# Patient Record
Sex: Male | Born: 1935 | Race: Asian | Hispanic: No | Marital: Married | Smoking: Never smoker
Health system: Southern US, Community
[De-identification: ages and names within clinical notes are randomized; demographics above are authoritative.]

## PROBLEM LIST (undated history)

## (undated) DIAGNOSIS — K219 Gastro-esophageal reflux disease without esophagitis: Secondary | ICD-10-CM

## (undated) DIAGNOSIS — Z923 Personal history of irradiation: Secondary | ICD-10-CM

## (undated) DIAGNOSIS — C159 Malignant neoplasm of esophagus, unspecified: Secondary | ICD-10-CM

## (undated) HISTORY — DX: Hypercalcemia: E83.52

---

## 2012-11-12 ENCOUNTER — Encounter (HOSPITAL_COMMUNITY): Payer: Self-pay | Admitting: Emergency Medicine

## 2012-11-12 DIAGNOSIS — K5900 Constipation, unspecified: Secondary | ICD-10-CM | POA: Diagnosis present

## 2012-11-12 DIAGNOSIS — R1310 Dysphagia, unspecified: Secondary | ICD-10-CM | POA: Diagnosis present

## 2012-11-12 DIAGNOSIS — R52 Pain, unspecified: Secondary | ICD-10-CM | POA: Diagnosis present

## 2012-11-12 DIAGNOSIS — K219 Gastro-esophageal reflux disease without esophagitis: Secondary | ICD-10-CM | POA: Diagnosis present

## 2012-11-12 DIAGNOSIS — E43 Unspecified severe protein-calorie malnutrition: Secondary | ICD-10-CM | POA: Diagnosis present

## 2012-11-12 DIAGNOSIS — K222 Esophageal obstruction: Secondary | ICD-10-CM | POA: Diagnosis present

## 2012-11-12 DIAGNOSIS — C159 Malignant neoplasm of esophagus, unspecified: Principal | ICD-10-CM | POA: Diagnosis present

## 2012-11-12 NOTE — ED Notes (Signed)
PT. REPORTS DIFFICULTY SWALLOWING SOLID FOOD AND SOMETIMES LIQUID FOR 3 MONTHS , PT. STATED IT FEELS LIKE IT GET STUCK IN HIS ESOPHAGUS , DAUGHTER TRANSLATING ( VIET[place]ESE ) FOR PT. RESPIRATIONS UNLABORED , SPEECH CLEAR , NO FACIAL SYMMETRY , EQUAL STRONG GRIPS / NO ARM DRIFT /AMBULATORY.

## 2012-11-13 ENCOUNTER — Encounter (HOSPITAL_COMMUNITY)
Admission: EM | Disposition: A | Discharge status: Home Health | Payer: Self-pay | Source: Home / Self Care | Attending: Internal Medicine

## 2012-11-13 ENCOUNTER — Inpatient Hospital Stay (HOSPITAL_COMMUNITY)
Admission: EM | Admit: 2012-11-13 | Discharge: 2012-11-22 | DRG: 374 | Disposition: A | Discharge status: Home Health | Payer: MEDICAID | Attending: Internal Medicine | Admitting: Internal Medicine

## 2012-11-13 ENCOUNTER — Inpatient Hospital Stay (HOSPITAL_COMMUNITY): Payer: Self-pay

## 2012-11-13 ENCOUNTER — Encounter (HOSPITAL_COMMUNITY): Payer: Self-pay

## 2012-11-13 DIAGNOSIS — E43 Unspecified severe protein-calorie malnutrition: Secondary | ICD-10-CM | POA: Insufficient documentation

## 2012-11-13 DIAGNOSIS — R109 Unspecified abdominal pain: Secondary | ICD-10-CM

## 2012-11-13 DIAGNOSIS — R1310 Dysphagia, unspecified: Secondary | ICD-10-CM

## 2012-11-13 DIAGNOSIS — C159 Malignant neoplasm of esophagus, unspecified: Secondary | ICD-10-CM

## 2012-11-13 HISTORY — PX: ESOPHAGOGASTRODUODENOSCOPY (EGD) WITH ESOPHAGEAL DILATION: SHX5812

## 2012-11-13 HISTORY — DX: Gastro-esophageal reflux disease without esophagitis: K21.9

## 2012-11-13 HISTORY — PX: SAVORY DILATION: SHX5439

## 2012-11-13 HISTORY — DX: Malignant neoplasm of esophagus, unspecified: C15.9

## 2012-11-13 LAB — BASIC METABOLIC PANEL
BUN: 7 mg/dL (ref 6–23)
Chloride: 106 mEq/L (ref 96–112)
GFR calc Af Amer: 89 mL/min — ABNORMAL LOW (ref >90)
Glucose, Bld: 117 mg/dL — ABNORMAL HIGH (ref 70–99)
Potassium: 4.3 mEq/L (ref 3.5–5.1)
Sodium: 138 mEq/L (ref 135–145)

## 2012-11-13 LAB — CBC
HCT: 34.3 % — ABNORMAL LOW (ref 39.0–52.0)
Hemoglobin: 11.3 g/dL — ABNORMAL LOW (ref 13.0–17.0)
Platelets: 209 10*3/uL (ref 150–400)
RDW: 14.1 % (ref 11.5–15.5)
RDW: 14.2 % (ref 11.5–15.5)
WBC: 7.9 10*3/uL (ref 4.0–10.5)
WBC: 6.3 10*3/uL (ref 4.0–10.5)

## 2012-11-13 LAB — POCT I-STAT, CHEM 8
Chloride: 101 mEq/L (ref 96–112)
HCT: 40 % (ref 39.0–52.0)
Hemoglobin: 13.6 g/dL (ref 13.0–17.0)
Potassium: 4.4 mEq/L (ref 3.5–5.1)

## 2012-11-13 SURGERY — EGD (ESOPHAGOGASTRODUODENOSCOPY)
Anesthesia: Moderate Sedation

## 2012-11-13 SURGERY — ESOPHAGOGASTRODUODENOSCOPY (EGD) WITH ESOPHAGEAL DILATION
Anesthesia: Moderate Sedation

## 2012-11-13 MED ORDER — BUTAMBEN-TETRACAINE-BENZOCAINE 2-2-14 % EX AERO
INHALATION_SPRAY | CUTANEOUS | Status: DC | PRN
  Administered 2012-11-13: 2 via TOPICAL

## 2012-11-13 MED ORDER — IOHEXOL 300 MG/ML  SOLN
100.0000 mL | Freq: Once | INTRAMUSCULAR | Status: AC | PRN
  Administered 2012-11-13: 100 mL via INTRAVENOUS

## 2012-11-13 MED ORDER — FENTANYL CITRATE 0.05 MG/ML IJ SOLN
INTRAMUSCULAR | Status: DC | PRN
  Administered 2012-11-13 (×3): 25 ug via INTRAVENOUS

## 2012-11-13 MED ORDER — MIDAZOLAM HCL 10 MG/2ML IJ SOLN
INTRAMUSCULAR | Status: DC | PRN
  Administered 2012-11-13 (×2): 2 mg via INTRAVENOUS
  Administered 2012-11-13: 1 mg via INTRAVENOUS
  Administered 2012-11-13: 2 mg via INTRAVENOUS

## 2012-11-13 MED ORDER — ONDANSETRON HCL 4 MG/2ML IJ SOLN
4.0000 mg | Freq: Four times a day (QID) | INTRAMUSCULAR | Status: DC | PRN
  Administered 2012-11-14 (×2): 4 mg via INTRAVENOUS
  Filled 2012-11-13 (×2): qty 2

## 2012-11-13 MED ORDER — SODIUM CHLORIDE 0.9 % IV SOLN
INTRAVENOUS | Status: DC
  Administered 2012-11-13: 500 mL via INTRAVENOUS

## 2012-11-13 MED ORDER — KCL IN DEXTROSE-NACL 20-5-0.9 MEQ/L-%-% IV SOLN
INTRAVENOUS | Status: DC
  Administered 2012-11-13: 1000 mL via INTRAVENOUS
  Administered 2012-11-13 – 2012-11-21 (×18): via INTRAVENOUS
  Filled 2012-11-13 (×25): qty 1000

## 2012-11-13 MED ORDER — PANTOPRAZOLE SODIUM 40 MG IV SOLR
40.0000 mg | INTRAVENOUS | Status: DC
  Administered 2012-11-13 – 2012-11-21 (×9): 40 mg via INTRAVENOUS
  Filled 2012-11-13 (×13): qty 40

## 2012-11-13 MED ORDER — PANTOPRAZOLE SODIUM 40 MG IV SOLR
40.0000 mg | Freq: Once | INTRAVENOUS | Status: AC
  Administered 2012-11-13: 40 mg via INTRAVENOUS
  Filled 2012-11-13: qty 40

## 2012-11-13 MED ORDER — SODIUM CHLORIDE 0.9 % IV SOLN: INTRAVENOUS | Status: DC

## 2012-11-13 MED ORDER — ONDANSETRON HCL 4 MG PO TABS: 4.0000 mg | ORAL_TABLET | Freq: Four times a day (QID) | ORAL | Status: DC | PRN

## 2012-11-13 MED ORDER — SODIUM CHLORIDE 0.9 % IV SOLN
INTRAVENOUS | Status: DC
  Administered 2012-11-13: 02:00:00 via INTRAVENOUS

## 2012-11-13 MED ORDER — IOHEXOL 300 MG/ML  SOLN
25.0000 mL | INTRAMUSCULAR | Status: AC
  Administered 2012-11-13: 25 mL via ORAL

## 2012-11-13 NOTE — Progress Notes (Signed)
Patient back from endoscopy, arousable, VSS. Will continue to monitor.

## 2012-11-13 NOTE — H&P (View-Only) (Signed)
Gastroenterology Consult: 8:22 AM 11/13/2012   Referring Provider: Marlin Canary  Primary Care Physician:  None Primary Gastroenterologist:  none  Pt does not speak English.  Vietnamese is his native tongue  Reason for Consultation:  Dysphagia, odynophagia  HPI: Douglas Jordan is a 79 y.o. male.  Hx GERD. Taking unspecified OTC "ulcer" medication. 3 months of progressive dysphagia, initially with solids, now unable to drink liquids. 25# weight loss. + regurgitation.  Pain in lower chest, xyphoid with swallowing.  No bloody or black stools.  BMET and CBC unremarkable.  No imaging to date.  His biggest complaint is the pain, that is what prompted his coming to ED.   No NSAIDs, no ETOH for decades, no tobacco products in use.     Past Medical History  Diagnosis Date  . GERD (gastroesophageal reflux disease) No knownn hx but, pt reports symptoms of mid sternal pain at xiphoid that is irriated when eating.    History reviewed. No pertinent past surgical history.  Prior to Admission medications   Medication Sig Start Date End Date Taking? Authorizing Provider  OVER THE COUNTER MEDICATION Take 1 tablet by mouth once. OTC med for ulcer   Yes Historical Provider, MD  Probiotic Product (PROBIOTIC DAILY PO) Take 1 tablet by mouth once.   Yes Historical Provider, MD    Scheduled Meds: . pantoprazole (PROTONIX) IV  40 mg Intravenous Q24H   Infusions: . dextrose 5 % and 0.9 % NaCl with KCl 20 mEq/L 1,000 mL (11/13/12 0415)   PRN Meds: ondansetron (ZOFRAN) IV, ondansetron   Allergies as of 11/12/2012  . (No Known Allergies)    No family history on file.  History   Social History  . Marital Status: Married    Spouse Name: N/A    Number of Children: N/A  . Years of Education: N/A   Occupational History  . Not on file.   Social History Main Topics  . Smoking status: Unknown If Ever Smoked  . Smokeless tobacco: Not on file  . Alcohol  Use: No  . Drug Use: No  . Sexually Active: Not on file   Other Topics Concern  . Not on file   Social History Narrative  . No narrative on file    REVIEW OF SYSTEMS: Constitutional:  No weakness, no falls ENT:  No nose bleeds Pulm:  No cough or dyspnea CV:  No palpitations .  No pedal edema GU:  No dysuria, no frequency.  No weak stream GI:  Per HPI.  BMs normal. Heme:  No hx of anemia. No hx jaundice   Transfusions:  none Neuro:  No headache,  No blurry vision. Derm:  No itching or sores Endocrine:  No increased thirst, no sweats or chills Immunization:  Not queried.   Travel:  Not queeried   PHYSICAL EXAM: Vital signs in last 24 hours: Temp:  [97.7 F (36.5 C)-97.9 F (36.6 C)] 97.7 F (36.5 C) (07/11 0619) Pulse Rate:  [73-102] 94 (07/11 0619) Resp:  [18-20] 18 (07/11 0619) BP: (130-159)/(76-88) 146/77 mmHg (07/11 0619) SpO2:  [97 %-100 %] 98 % (07/11 0619) Weight:  [55.339 kg (122 lb)] 55.339 kg (122 lb) (07/11 0306)  General: lokks well, does not look 81.  Comfortable except for the epigatric/chest pain Head:  No asymmetry, no facial edema  Eyes:  No icterus or pallor Ears:  ? HOH  Nose:  No discharge or congestion Mouth:  Good dentition, most teeth still intact.  Moist and clear oral  MM Neck:  No JVD or bruits Lungs:  Clear bil.  No dyspnea or cough Heart: RRR.  No MRG Abdomen:  Soft, ND.  Tender in epigastrum but no guard/rebound.  No mass.  No  organomegaly.   Rectal: deferred   Musc/Skeltl: no joint deformity or swelling Extremities:  No CCE  Neurologic:  No tremor, oriented x 3.  Moves all 4 limbs. Skin:  No sores, no rash Tattoos:  none Nodes:  No cervical adenopathy   Psych:  Cooperative, pleasant.   Intake/Output from previous day:   Intake/Output this shift:    LAB RESULTS:  Recent Labs  11/13/12 0107 11/13/12 0123  WBC 7.9  --   HGB 12.5* 13.6  HCT 37.2* 40.0  PLT 209  --    BMET Lab Results  Component Value Date   NA 139  11/13/2012   K 4.4 11/13/2012   CL 101 11/13/2012   GLUCOSE 100* 11/13/2012   BUN 9 11/13/2012   CREATININE 1.10 11/13/2012   RADIOLOGY STUDIES: None  ENDOSCOPIC STUDIES: none  IMPRESSION: *  Progressive dysphagia and odynphagia with weight loss.  Rule out benign peptic stricture vs esophageal cancer.  Other possiblilities include achalasia or esophageal dysmotility.  *  Language barrier.   *  Lack of primary care provider.   PLAN: *  EGD with possible esophageal dilatation around 1 PM today.  Spoke with patient and translator and with daughter.  Pt understands that he is to undergo EGD with possible dilatation and biopsy.  Risks of esophageal perforation d/w daughter.  She gave verbal consent to the procedure.    LOS: 0 days   Jennye Moccasin  11/13/2012, 8:22 AM Pager: [phone]     ________________________________________________________________________  [facility] MD note:  I personally examined the patient, reviewed the data and agree with the assessment and plan described above.  HIgh suspicion for esopahgeal cancer. For EGD todfay.   Rob Bunting, MD [facility] Pager [phone]

## 2012-11-13 NOTE — Progress Notes (Signed)
Patient to endoscopy.

## 2012-11-13 NOTE — Consult Note (Signed)
[facility] Consult: 8:22 AM 11/13/2012   Referring Provider: Jessica Vann  Primary Care Physician:  None Primary Gastroenterologist:  none  Pt does not speak English.  Vietnamese is his native tongue  Reason for Consultation:  Dysphagia, odynophagia  HPI: Douglas Jordan is a 79 y.o. male.  Hx GERD. Taking unspecified OTC "ulcer" medication. 3 months of progressive dysphagia, initially with solids, now unable to drink liquids. 25# weight loss. + regurgitation.  Pain in lower chest, xyphoid with swallowing.  No bloody or black stools.  BMET and CBC unremarkable.  No imaging to date.  His biggest complaint is the pain, that is what prompted his coming to ED.   No NSAIDs, no ETOH for decades, no tobacco products in use.     Past Medical History  Diagnosis Date  . GERD (gastroesophageal reflux disease) No knownn hx but, pt reports symptoms of mid sternal pain at xiphoid that is irriated when eating.    History reviewed. No pertinent past surgical history.  Prior to Admission medications   Medication Sig Start Date End Date Taking? Authorizing Provider  OVER THE COUNTER MEDICATION Take 1 tablet by mouth once. OTC med for ulcer   Yes Historical Provider, MD  Probiotic Product (PROBIOTIC DAILY PO) Take 1 tablet by mouth once.   Yes Historical Provider, MD    Scheduled Meds: . pantoprazole (PROTONIX) IV  40 mg Intravenous Q24H   Infusions: . dextrose 5 % and 0.9 % NaCl with KCl 20 mEq/L 1,000 mL (11/13/12 0415)   PRN Meds: ondansetron (ZOFRAN) IV, ondansetron   Allergies as of 11/12/2012  . (No Known Allergies)    No family history on file.  History   Social History  . Marital Status: Married    Spouse Name: N/A    Number of Children: N/A  . Years of Education: N/A   Occupational History  . Not on file.   Social History Main Topics  . Smoking status: Unknown If Ever Smoked  . Smokeless tobacco: Not on file  . Alcohol  Use: No  . Drug Use: No  . Sexually Active: Not on file   Other Topics Concern  . Not on file   Social History Narrative  . No narrative on file    REVIEW OF SYSTEMS: Constitutional:  No weakness, no falls ENT:  No nose bleeds Pulm:  No cough or dyspnea CV:  No palpitations .  No pedal edema GU:  No dysuria, no frequency.  No weak stream GI:  Per HPI.  BMs normal. Heme:  No hx of anemia. No hx jaundice   Transfusions:  none Neuro:  No headache,  No blurry vision. Derm:  No itching or sores Endocrine:  No increased thirst, no sweats or chills Immunization:  Not queried.   Travel:  Not queeried   PHYSICAL EXAM: Vital signs in last 24 hours: Temp:  [97.7 F (36.5 C)-97.9 F (36.6 C)] 97.7 F (36.5 C) (07/11 0619) Pulse Rate:  [73-102] 94 (07/11 0619) Resp:  [18-20] 18 (07/11 0619) BP: (130-159)/(76-88) 146/77 mmHg (07/11 0619) SpO2:  [97 %-100 %] 98 % (07/11 0619) Weight:  [55.339 kg (122 lb)] 55.339 kg (122 lb) (07/11 0306)  General: lokks well, does not look 81.  Comfortable except for the epigatric/chest pain Head:  No asymmetry, no facial edema  Eyes:  No icterus or pallor Ears:  ? HOH  Nose:  No discharge or congestion Mouth:  Good dentition, most teeth still intact.  Moist and clear oral   MM Neck:  No JVD or bruits Lungs:  Clear bil.  No dyspnea or cough Heart: RRR.  No MRG Abdomen:  Soft, ND.  Tender in epigastrum but no guard/rebound.  No mass.  No  organomegaly.   Rectal: deferred   Musc/Skeltl: no joint deformity or swelling Extremities:  No CCE  Neurologic:  No tremor, oriented x 3.  Moves all 4 limbs. Skin:  No sores, no rash Tattoos:  none Nodes:  No cervical adenopathy   Psych:  Cooperative, pleasant.   Intake/Output from previous day:   Intake/Output this shift:    LAB RESULTS:  Recent Labs  11/13/12 0107 11/13/12 0123  WBC 7.9  --   HGB 12.5* 13.6  HCT 37.2* 40.0  PLT 209  --    BMET Lab Results  Component Value Date   NA 139  11/13/2012   K 4.4 11/13/2012   CL 101 11/13/2012   GLUCOSE 100* 11/13/2012   BUN 9 11/13/2012   CREATININE 1.10 11/13/2012   RADIOLOGY STUDIES: None  ENDOSCOPIC STUDIES: none  IMPRESSION: *  Progressive dysphagia and odynphagia with weight loss.  Rule out benign peptic stricture vs esophageal cancer.  Other possiblilities include achalasia or esophageal dysmotility.  *  Language barrier.   *  Lack of primary care provider.   PLAN: *  EGD with possible esophageal dilatation around 1 PM today.  Spoke with patient and translator and with daughter.  Pt understands that he is to undergo EGD with possible dilatation and biopsy.  Risks of esophageal perforation d/w daughter.  She gave verbal consent to the procedure.    LOS: 0 days   Sarah Gribbin  11/13/2012, 8:22 AM Pager: [phone]     ________________________________________________________________________  [facility] MD note:  I personally examined the patient, reviewed the data and agree with the assessment and plan described above.  HIgh suspicion for esopahgeal cancer. For EGD todfay.   Daniel Jacobs, MD [facility] Pager [phone]  

## 2012-11-13 NOTE — Progress Notes (Signed)
Patient admitted early this AM by Dr. Conley Rolls- please see H&P.  For EGD today.  Appreciate GI- medically stable  Marlin Canary DO

## 2012-11-13 NOTE — H&P (Signed)
[facility] History and Physical  Douglas Jordan ZOX:[id number] DOB: Dec 06, 1929 DOA: 11/13/2012  Referring physician: ED PCP: No PCP Per Patient   Chief Complaint: Dysphagia  HPI: Douglas Jordan is a 79 y.o. male who presents to the ED with c/o dysphagia.  He describes his symptoms through his family member who is translating for the patient as food "getting stuck in his throat".  This has been getting progressively worse over the last few months, started with solids, and has now progressed to even thin liquids.  No emesis nor nausea, it does hurt when he tries to swallow solids as food passes down his throat.  The EDP called Dr. Leone Payor due to the progressive dysphagia, Dr. Leone Payor wants the patient admitted to medicine and plans to take patient for EGD in AM.  Review of Systems: 12 systems reviewed and otherwise negative.  Past Medical History  Diagnosis Date  . GERD (gastroesophageal reflux disease) No knownn hx but, pt reports symptoms of mid sternal pain at xiphoid that is irriated when eating.   History reviewed. No pertinent past surgical history. Social History:  reports that he does not drink alcohol or use illicit drugs. His tobacco history is not on file.   No Known Allergies  No family history on file.   Prior to Admission medications   Medication Sig Start Date End Date Taking? Authorizing Provider  OVER THE COUNTER MEDICATION Take 1 tablet by mouth once. OTC med for ulcer   Yes Historical Provider, MD  Probiotic Product (PROBIOTIC DAILY PO) Take 1 tablet by mouth once.   Yes Historical Provider, MD   Physical Exam: Filed Vitals:   11/13/12 0200 11/13/12 0215 11/13/12 0230 11/13/12 0245  BP: 149/76 152/84 144/80 130/80  Pulse: 77 73 79 81  Temp:      TempSrc:      Resp:      SpO2: 100% 100% 100% 100%    General:  NAD, resting comfortably in bed Eyes: PEERLA EOMI ENT: mucous membranes moist Neck: supple w/o JVD Cardiovascular: RRR w/o MRG Respiratory: CTA  B Abdomen: soft, nt, nd, bs+ Skin: no rash nor lesion Musculoskeletal: MAE, full ROM all 4 extremities Psychiatric: normal tone and affect Neurologic: AAOx3, grossly non-focal  Labs on Admission:  Basic Metabolic Panel:  Recent Labs Lab 11/13/12 0123  NA 139  K 4.4  CL 101  GLUCOSE 100*  BUN 9  CREATININE 1.10   Liver Function Tests: No results found for this basename: AST, ALT, ALKPHOS, BILITOT, PROT, ALBUMIN,  in the last 168 hours No results found for this basename: LIPASE, AMYLASE,  in the last 168 hours No results found for this basename: AMMONIA,  in the last 168 hours CBC:  Recent Labs Lab 11/13/12 0107 11/13/12 0123  WBC 7.9  --   HGB 12.5* 13.6  HCT 37.2* 40.0  MCV 83.0  --   PLT 209  --    Cardiac Enzymes: No results found for this basename: CKTOTAL, CKMB, CKMBINDEX, TROPONINI,  in the last 168 hours  BNP (last 3 results) No results found for this basename: PROBNP,  in the last 8760 hours CBG: No results found for this basename: GLUCAP,  in the last 168 hours  Radiological Exams on Admission: No results found.  EKG: Independently reviewed.  Assessment/Plan Active Problems:   Dysphagia   1. Dysphagia - the slow progressive dysphagia to liquids and now solids is somewhat concerning for malignancy or other mass obstruction vs dysmotility disorder, especially with his PMH  of GERD.  Agree with Dr. Leone Payor, will admit patient to our service, keep patient NPO and plan on EGD in AM as discussed with family at bedside.  Dr. Leone Payor planning to take patient for EGD in AM.  Code Status: Full Code (must indicate code status--if unknown or must be presumed, indicate so) Family Communication: Spoke with daughter at bedside who translated for patient (indicate person spoken with, if applicable, with phone number if by telephone) Disposition Plan: Admit to obs (indicate anticipated LOS)  Time spent: 50 min  Rayland Hamed M. [facility] Pager  [phone]  If 7PM-7AM, please contact night-coverage [email or web address] Password [facility] 11/13/2012, 3:55 AM

## 2012-11-13 NOTE — Progress Notes (Signed)
INITIAL NUTRITION ASSESSMENT  DOCUMENTATION CODES Per approved criteria  -Severe malnutrition in the context of acute illness or injury   INTERVENTION:  Advance diet as medically appropriate RD to follow for nutrition care plan, add interventions accordingly  NUTRITION DIAGNOSIS: Inadequate oral intake related to inability to eat as evidenced by NPO status  Goal: Oral intake with meals & supplements to meet >/= 90% of estimated nutrition needs  Monitor:  PO diet advancement & intake, weight, labs, I/O's  Reason for Assessment: Malnutrition Screening Tool Report  79 y.o. male  Admitting Dx: dysphagia and odynophagia, progressive  ASSESSMENT: Patient with PMH of GERD presented with dysphagia and odynophagia; has been feeling solid food get stuck about 3 months ago; evaluation in ER included a normal Hb of 13.6 ---> GI was consulted and hospitalist was asked to accept patient to [facility] for admission.  RD utilized telephonic interpreter service to obtain nutrition hx ([place] patient reports a decreased appetite and difficulty with swallowing solids & liquids for the past 3 months; also confirms a 25 lb weight loss in this time (17%) ---> severe for time frame; he is amenable to nutrition supplements when/as able.  Patient meets criteria for severe malnutrition in the context of acute illness or injury given < 75% intake of estimated energy requirement for > 1 month and 17% weight loss x 3 months.  Height: Ht Readings from Last 1 Encounters:  11/13/12 5\' 4"  (1.626 m)    Weight: Wt Readings from Last 1 Encounters:  11/13/12 122 lb (55.339 kg)    Ideal Body Weight: 130 lb  % Ideal Body Weight: 94%  Wt Readings from Last 10 Encounters:  11/13/12 122 lb (55.339 kg)  11/13/12 122 lb (55.339 kg)  11/13/12 122 lb (55.339 kg)    Usual Body Weight: ~ 147 lb  % Usual Body Weight: 83%  BMI:  Body mass index is 20.93 kg/(m^2).  Estimated Nutritional Needs: Kcal:  1800-2000 Protein: 90-100 gm Fluid: 1.8-2.0 L  Skin: Intact  Diet Order: NPO  EDUCATION NEEDS: -No education needs identified at this time  Labs:   Recent Labs Lab 11/13/12 0123  NA 139  K 4.4  CL 101  BUN 9  CREATININE 1.10  GLUCOSE 100*    Scheduled Meds: . pantoprazole (PROTONIX) IV  40 mg Intravenous Q24H    Continuous Infusions: . sodium chloride    . sodium chloride    . dextrose 5 % and 0.9 % NaCl with KCl 20 mEq/L 1,000 mL (11/13/12 0415)    Past Medical History  Diagnosis Date  . GERD (gastroesophageal reflux disease) No knownn hx but, pt reports symptoms of mid sternal pain at xiphoid that is irriated when eating.    History reviewed. No pertinent past surgical history.  Maureen Chatters, RD, LDN Pager #: [phone] After-Hours Pager #: [phone]

## 2012-11-13 NOTE — ED Notes (Signed)
Denies N/V/D. States he simply feels like when he eats the food can not go down and it causes him great pain.

## 2012-11-13 NOTE — ED Provider Notes (Signed)
   History    CSN: [id number] Arrival date & time 11/12/12  2123  First MD Initiated Contact with Patient 11/13/12 0053     Chief Complaint  Patient presents with  . Dysphagia   (Consider location/radiation/quality/duration/timing/severity/associated sxs/prior Treatment) HPI Hx per daughter. She interprets vietnamese.  Over the last few months worsening dysphagia. Started with solids and now also with liquids, had trouble eating soup today, no emesis. No CP or SOB. It does hurt when he tries to swallow solids, as food passes. He has had previous reflux symptoms, no PCP. No F/C. No h/o similar symptoms otherwise, are mod in severity.   History reviewed. No pertinent past medical history. History reviewed. No pertinent past surgical history. No family history on file. History  Substance Use Topics  . Smoking status: Never Smoker   . Smokeless tobacco: Not on file  . Alcohol Use: No    Review of Systems  Constitutional: Negative for fever and chills.  HENT: Negative for neck pain and neck stiffness.   Eyes: Negative for pain.  Respiratory: Negative for shortness of breath.   Cardiovascular: Negative for chest pain.  Gastrointestinal: Negative for vomiting and abdominal pain.  Genitourinary: Negative for dysuria.  Musculoskeletal: Negative for back pain.  Skin: Negative for rash.  Neurological: Negative for headaches.  All other systems reviewed and are negative.    Allergies  Review of patient's allergies indicates no known allergies.  Home Medications   Current Outpatient Rx  Name  Route  Sig  Dispense  Refill  . OVER THE COUNTER MEDICATION   Oral   Take 1 tablet by mouth once. OTC med for ulcer         . Probiotic Product (PROBIOTIC DAILY PO)   Oral   Take 1 tablet by mouth once.          BP 155/88  Pulse 89  Temp(Src) 97.9 F (36.6 C) (Oral)  Resp 20  SpO2 97% Physical Exam  Constitutional: He is oriented to person, place, and time. He appears  well-developed and well-nourished.  HENT:  Head: Normocephalic and atraumatic.  Eyes: EOM are normal. Pupils are equal, round, and reactive to light.  Neck: Neck supple.  Cardiovascular: Normal rate, regular rhythm and intact distal pulses.   Pulmonary/Chest: Effort normal and breath sounds normal. No respiratory distress. He exhibits no tenderness.  Abdominal: Soft. Bowel sounds are normal. He exhibits no distension. There is no tenderness.  Musculoskeletal: Normal range of motion. He exhibits no edema.  Neurological: He is alert and oriented to person, place, and time. No cranial nerve deficit.  Skin: Skin is warm and dry.    ED Course  Procedures (including critical care time)  Gags on water in the ED. Made NPO  IVFs. IV protonix  1:13 AM d/w GI Dr Leone Payor, plan admit OBS and GI will see in am and likely scope  1:24 AM DR Julian Reil to admit  MDM  Dysphagia progressive/ now trouble with fluids  Labs ordered  IVfs  MED admit  Sunnie Nielsen, MD 11/13/12 0131

## 2012-11-13 NOTE — H&P (Signed)
[facility] History and Physical  Winson Eichorn ZOX:[id number] DOB: 09/16/1929    PCP:   None.  Chief Complaint: dysphagia and odynophagia, progressive.  HPI: Douglas Jordan is an 79 y.o. male with benign PMH except for GERD, untreated, presents with dysphagia and odynophagia.  He said he has originally feeling solid food stuck about 3 months ago, and this has progressed to him unable to drink even liquid.  He has gone from solid to soup, and has lost about 25 lbs over the last 3 months.  Before, he was having GERD, with some food regurgitation.  He had pain about the xyphoid process when he eats.  There has been no black of bloody stool, and he has normal BM today.  Evalaution in the ER included a normal Hb of 13.6.  Other serology was unremarkable as well.  Dr Leone Payor of GI was consulted and hospitalist was asked to accept patient to [facility] for admission.  Rewiew of Systems:  Constitutional: Negative for malaise, fever and chills. No significant weight gain Eyes: Negative for eye pain, redness and discharge, diplopia, visual changes, or flashes of light. ENMT: Negative for ear pain, hoarseness, nasal congestion, sinus pressure and sore throat. No headaches; tinnitus, drooling, or problem swallowing. Cardiovascular: Negative for chest pain, palpitations, diaphoresis, dyspnea and peripheral edema. ; No orthopnea, PND Respiratory: Negative for cough, hemoptysis, wheezing and stridor. No pleuritic chestpain. Gastrointestinal: Negative for nausea, vomiting, diarrhea, constipation, melena, blood in stool, hematemesis, jaundice and rectal bleeding.    Genitourinary: Negative for frequency, dysuria, incontinence,flank pain and hematuria; Musculoskeletal: Negative for back pain and neck pain. Negative for swelling and trauma.;  Skin: . Negative for pruritus, rash, abrasions, bruising and skin lesion.; ulcerations Neuro: Negative for headache, lightheadedness and neck stiffness. Negative for weakness, altered  level of consciousness , altered mental status, extremity weakness, burning feet, involuntary movement, seizure and syncope.  Psych: negative for anxiety, depression, insomnia, tearfulness, panic attacks, hallucinations, paranoia, suicidal or homicidal ideation    Past Medical History  Diagnosis Date  . GERD (gastroesophageal reflux disease) No knownn hx but, pt reports symptoms of mid sternal pain at xiphoid that is irriated when eating.    History reviewed. No pertinent past surgical history.  Medications:  HOME MEDS: Prior to Admission medications   Medication Sig Start Date End Date Taking? Authorizing Provider  OVER THE COUNTER MEDICATION Take 1 tablet by mouth once. OTC med for ulcer   Yes Historical Provider, MD  Probiotic Product (PROBIOTIC DAILY PO) Take 1 tablet by mouth once.   Yes Historical Provider, MD     Allergies:  No Known Allergies  Social History:   reports that he does not drink alcohol or use illicit drugs. His tobacco history is not on file.  Family History: No family history on file.   Physical Exam: Filed Vitals:   11/13/12 0200 11/13/12 0215 11/13/12 0230 11/13/12 0245  BP: 149/76 152/84 144/80 130/80  Pulse: 77 73 79 81  Temp:      TempSrc:      Resp:      SpO2: 100% 100% 100% 100%   Blood pressure 130/80, pulse 81, temperature 97.9 F (36.6 C), temperature source Oral, resp. rate 20, SpO2 100.00%.  GEN:  Pleasant  patient lying in the stretcher in no acute distress; cooperative with exam. PSYCH:  alert and oriented x4; does not appear anxious or depressed; affect is appropriate. HEENT: Mucous membranes pink and anicteric; PERRLA; EOM intact; no cervical lymphadenopathy nor thyromegaly or carotid  bruit; no JVD; There were no stridor. Neck is very supple. Breasts:: Not examined CHEST WALL: No tenderness CHEST: Normal respiration, clear to auscultation bilaterally.  HEART: Regular rate and rhythm.  There are no murmur, rub, or gallops.    BACK: No kyphosis or scoliosis; no CVA tenderness ABDOMEN: soft and non-tender; no masses, no organomegaly, normal abdominal bowel sounds; no pannus; no intertriginous candida. There is no rebound and no distention. Rectal Exam: Not done EXTREMITIES: No bone or joint deformity; age-appropriate arthropathy of the hands and knees; no edema; no ulcerations.  There is no calf tenderness. Genitalia: not examined PULSES: 2+ and symmetric SKIN: Normal hydration no rash or ulceration CNS: Cranial nerves 2-12 grossly intact no focal lateralizing neurologic deficit.  Speech is fluent; uvula elevated with phonation, facial symmetry and tongue midline. DTR are normal bilaterally, cerebella exam is intact, barbinski is negative and strengths are equaled bilaterally.  No sensory loss.   Labs on Admission:  Basic Metabolic Panel:  Recent Labs Lab 11/13/12 0123  NA 139  K 4.4  CL 101  GLUCOSE 100*  BUN 9  CREATININE 1.10   Liver Function Tests: No results found for this basename: AST, ALT, ALKPHOS, BILITOT, PROT, ALBUMIN,  in the last 168 hours No results found for this basename: LIPASE, AMYLASE,  in the last 168 hours No results found for this basename: AMMONIA,  in the last 168 hours CBC:  Recent Labs Lab 11/13/12 0107 11/13/12 0123  WBC 7.9  --   HGB 12.5* 13.6  HCT 37.2* 40.0  MCV 83.0  --   PLT 209  --    Cardiac Enzymes: No results found for this basename: CKTOTAL, CKMB, CKMBINDEX, TROPONINI,  in the last 168 hours  CBG: No results found for this basename: GLUCAP,  in the last 168 hours   Radiological Exams on Admission: No results found.  Assessment/Plan  Progressive dysphagia. GERD.  PLAN:  I am hoping that he has esosphageal stricture from GERD, but concerned very much about malignancy.  Will make NPO and give IVF.  Start PPI.  He will likely need EGD as soon as possible.  He is stable, full code, and will be admitted to [facility] service.  Thank you for letting participate  in his care.  Other plans as per orders.  Code Status: FULL Unk Lightning, MD. [facility] Pager [phone] 7pm to 7am.  11/13/2012, 4:02 AM

## 2012-11-13 NOTE — Op Note (Addendum)
[facility] [address] [place], [address]   ENDOSCOPY PROCEDURE REPORT  PATIENT: Douglas Jordan, Douglas Jordan  MR#: [id number] BIRTHDATE: 04-Sep-1929 , 83  yrs. old GENDER: Male ENDOSCOPIST: Rachael Fee, MD PROCEDURE DATE:  11/13/2012 PROCEDURE:  EGD w/ biopsy ASA CLASS:     Class III INDICATIONS:  dysphagia, weight loss. MEDICATIONS: Fentanyl 75 mcg IV and Versed 8 mg IV TOPICAL ANESTHETIC: none  DESCRIPTION OF PROCEDURE: After the risks benefits and alternatives of the procedure were thoroughly explained, informed consent was obtained.  The Pentax Gastroscope [id number] endoscope was introduced through the mouth and advanced to the esophagus lower. Without limitations.  The instrument was slowly withdrawn as the mucosa was fully examined.     There was a malignant appearing mass with proximal edge located 27cm from incisors (proximal to mid esophagus).  This extended distally and became circumferential and obstructing.  I could not advance the gastroscope into his stomach.  Multiple biopsies were taken. Retroflexion was not performed.     The scope was then withdrawn from the patient and the procedure completed.  COMPLICATIONS: There were no complications. ENDOSCOPIC IMPRESSION: There was a malignant appearing mass with proximal edge located 27cm from incisors (proximal to mid esophagus).  This extended distally and became circumferential and obstructing.  I could not advance the gastroscope into his stomach.  Multiple biopsies were taken.  RECOMMENDATIONS: He needs staging CT scan (chest, abdomen, pelvis).  He will need nutrition addressed, PEG tube cannot be placed endoscopically because the tumor is obstructing access to his stomach.  IR should be consulted following staging workup.  Should consider inpatient oncology consultation as well.   eSigned:  Rachael Fee, MD 11/13/2012 1:31 PM         Addendum: I spoke with his daughter Selena Batten on  the phone about the findings, recommenedations.

## 2012-11-13 NOTE — Interval H&P Note (Signed)
History and Physical Interval Note:  11/13/2012 1:07 PM  Douglas Jordan  has presented today for surgery, with the diagnosis of difficulty swallowing  The various methods of treatment have been discussed with the patient and family. After consideration of risks, benefits and other options for treatment, the patient has consented to  Procedure(s): ESOPHAGOGASTRODUODENOSCOPY (EGD) WITH ESOPHAGEAL DILATION (N/A) SAVORY DILATION (N/A) as a surgical intervention .  The patient's history has been reviewed, patient examined, no change in status, stable for surgery.  I have reviewed the patient's chart and labs.  Questions were answered to the patient's satisfaction.     Rob Bunting

## 2012-11-14 DIAGNOSIS — E43 Unspecified severe protein-calorie malnutrition: Secondary | ICD-10-CM | POA: Insufficient documentation

## 2012-11-14 LAB — APTT: aPTT: 40 seconds — ABNORMAL HIGH (ref 24–37)

## 2012-11-14 MED ORDER — MORPHINE SULFATE 2 MG/ML IJ SOLN
1.0000 mg | INTRAMUSCULAR | Status: DC | PRN
  Administered 2012-11-14 – 2012-11-16 (×11): 1 mg via INTRAVENOUS
  Filled 2012-11-14 (×11): qty 1

## 2012-11-14 NOTE — Progress Notes (Signed)
[facility] Progress Note    Since last GI note: EGD yesterday, large obstructing malignant appearing esoiphagus mass. Biopsies taken, results probably back on Monday or Tuesday.  Staging CT scan suggests regional malignant adenopathy as well as distant metastasis (liver).  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  He was sleeping in room and I did not awaken him this AM.  I spoke with his daughter Selena Batten after endoscopy yesterday  Objective: Vital signs in last 24 hours: Temp:  [97.7 F (36.5 C)-98.3 F (36.8 C)] 98.3 F (36.8 C) (07/12 0628) Pulse Rate:  [69-84] 69 (07/12 0628) Resp:  [13-27] 16 (07/12 0628) BP: (110-156)/(69-89) 130/78 mmHg (07/12 0628) SpO2:  [94 %-100 %] 99 % (07/12 0628) Weight:  [128 lb 4.8 oz (58.196 kg)] 128 lb 4.8 oz (58.196 kg) (07/11 1452)   General: sleeping    Lab Results:  Recent Labs  11/13/12 0107 11/13/12 0123 11/13/12 1709  WBC 7.9  --  6.3  HGB 12.5* 13.6 11.3*  PLT 209  --  212  MCV 83.0  --  83.5    Recent Labs  11/13/12 0123 11/13/12 1709  NA 139 138  K 4.4 4.3  CL 101 106  CO2  --  25  GLUCOSE 100* 117*  BUN 9 7  CREATININE 1.10 0.92  CALCIUM  --  9.5     Medications: Scheduled Meds: . pantoprazole (PROTONIX) IV  40 mg Intravenous Q24H   Continuous Infusions: . dextrose 5 % and 0.9 % NaCl with KCl 20 mEq/L 125 mL/hr at 11/14/12 0114   PRN Meds:.ondansetron (ZOFRAN) IV, ondansetron    Assessment/Plan: 79 y.o. male with esophageal obstruction from presumed metastatic esophageal adenocarcinoma  Biopsies pending.  Should be back Monday or Tuesday.  I think he should have g-tube placed while in hosp to allow him to get nutrition.  The mass is obstructing his distal esophagus and I could not advance scope into stomach so this would have to be done by IR. With the complete obstruction, stenting is also not an option.   Given his social situation, should also consider making contact with oncology  while he is still here in hosp to expedite, facilitate out patient followup.    Please call or page with any further questions or concerns.     Rob Bunting, MD  11/14/2012, 8:49 AM [facility] Pager [phone]

## 2012-11-14 NOTE — Progress Notes (Signed)
IR aware of Gastrostomy tube placement request. Chart reviewed of this 79yo male with distal esophageal obstruction from large mass. Endoscopy report and images reviewed. CT of chest and abdomen reviewed. Evidence of metastatic spread noted  Pull-through gastrostomy is not possible due to esophageal obstruction. The pt is able to pass liquids given the evidence of the po contrast on his CT scan. However, IR would need to be able to pass a 5Fr catheter past obstruction to be able to insuflate stomach to allow safe direct stick.   Discussed with Dr. Fredia Sorrow IR will wait for biopsies and Oncology eval for possible planned treatment plan before proceeding with attempt at G-tube. Will follow along and reassess Monday.  Brayton El PA-C Interventional Radiology 11/14/2012 11:03 AM

## 2012-11-14 NOTE — Progress Notes (Addendum)
[facility] PROGRESS NOTE  Douglas Jordan AVW:[id number] DOB: 08-28-1929 DOA: 11/13/2012 PCP: No PCP Per Patient  Assessment/Plan: esophageal obstruction from presumed metastatic esophageal adenocarcinoma  Biopsies pending from EGD. Should be back Monday or Tuesday. IR for G tube placement.  The mass is obstructing his distal esophagus . Will consult oncology on Monday  CT scan of lung/abd/pelvis: 1. Bulky annular soft tissue mass involving the distal thoracic  esophagus, consistent with known primary esophageal carcinoma.  2. Metastatic lymphadenopathy in the posterior mediastinum and  right paratracheal region. Mild right hilar lymphadenopathy,  possibly also secondary to metastatic disease.  3. 6 mm noncalcified right lower lobe pulmonary nodule. Pulmonary  metastasis cannot be excluded. Consider PET CT scan for further  Evaluation  1. Metastatic upper abdominal lymphadenopathy in the gastrohepatic  ligament.  2. 1.5 cm hypovascular lesion in the posterior segment of the  right hepatic lobe, suspicious for liver metastasis. Consider PET  CT scan or abdomen MRI without and with contrast for further  evaluation.  Protein calorie malnutrition -G tube and then nutrition  Code Status: full Family Communication: called KIM and LM- spoke with patient in English as that is listed as his preferred language Disposition Plan: here until biopsy results are back   Consultants:  GI  IR  Oncology- will call on Monday  Procedures:  G tube per IR  EGD  Antibiotics:    HPI/Subjective: No new c/o  Objective: Filed Vitals:   11/13/12 1400 11/13/12 1452 11/13/12 2210 11/14/12 0628  BP: 110/69 120/72 141/80 130/78  Pulse:  71 73 69  Temp:  97.9 F (36.6 C) 98.1 F (36.7 C) 98.3 F (36.8 C)  TempSrc:  Oral Oral Oral  Resp: 27 16 16 16   Height:  5\' 1"  (1.549 m)    Weight:  58.196 kg (128 lb 4.8 oz)    SpO2: 97% 99% 99% 99%    Intake/Output Summary (Last 24 hours) at  11/14/12 1031 Last data filed at 11/13/12 1130  Gross per 24 hour  Intake 906.25 ml  Output      0 ml  Net 906.25 ml   Filed Weights   11/13/12 0306 11/13/12 1452  Weight: 55.339 kg (122 lb) 58.196 kg (128 lb 4.8 oz)    Exam:   General:  A+Ox3, NAD  Cardiovascular: rrr  Respiratory: clear anterior  Abdomen: +BS, soft  Musculoskeletal: moves all 4 ext  Data Reviewed: Basic Metabolic Panel:  Recent Labs Lab 11/13/12 0123 11/13/12 1709  NA 139 138  K 4.4 4.3  CL 101 106  CO2  --  25  GLUCOSE 100* 117*  BUN 9 7  CREATININE 1.10 0.92  CALCIUM  --  9.5   Liver Function Tests: No results found for this basename: AST, ALT, ALKPHOS, BILITOT, PROT, ALBUMIN,  in the last 168 hours No results found for this basename: LIPASE, AMYLASE,  in the last 168 hours No results found for this basename: AMMONIA,  in the last 168 hours CBC:  Recent Labs Lab 11/13/12 0107 11/13/12 0123 11/13/12 1709  WBC 7.9  --  6.3  HGB 12.5* 13.6 11.3*  HCT 37.2* 40.0 34.3*  MCV 83.0  --  83.5  PLT 209  --  212   Cardiac Enzymes: No results found for this basename: CKTOTAL, CKMB, CKMBINDEX, TROPONINI,  in the last 168 hours BNP (last 3 results) No results found for this basename: PROBNP,  in the last 8760 hours CBG: No results found for this basename: GLUCAP,  in the last 168 hours  No results found for this or any previous visit (from the past 240 hour(s)).   Studies: Ct Chest W Contrast  11/13/2012   *RADIOLOGY REPORT*  Clinical Data:  Esophageal carcinoma.  CT CHEST, ABDOMEN AND PELVIS WITH CONTRAST  Technique:  Multidetector CT imaging of the chest, abdomen and pelvis was performed following the standard protocol during bolus administration of intravenous contrast.  Contrast: OMNIPAQUE IOHEXOL 300 MG/ML  SOLN  Comparison:   None.  CT CHEST  Findings:  A bulky annular soft tissue mass is seen involving the distal thoracic esophagus which measures approximately 3.2 x 4.0 x 5.9  cm.  Para esophageal lymphadenopathy is seen within the posterior mediastinum both anterior and posterior to the descending thoracic aorta.  Largest lymph node measures 1.5 cm on image 33. Mediastinal lymphadenopathy also seen in the right paratracheal region measuring 2.0 x 2.8 cm on image 10. A 1.3 cm right hilar lymph node is seen with a small central focus of calcification. This could represent metastatic disease or partially calcified granulomatous disease.  No evidence of pleural or pericardial effusion. A 6 mm noncalcified pulmonary nodule is seen in the right lower lobe on image 29, which raises suspicion for pulmonary metastasis.  No other suspicious pulmonary nodules or masses are identified.  IMPRESSION:  1.  Bulky annular soft tissue mass involving the distal thoracic esophagus, consistent with known primary esophageal carcinoma. 2.  Metastatic lymphadenopathy in the posterior mediastinum and right paratracheal region.  Mild right hilar lymphadenopathy, possibly also secondary to metastatic disease. 3.  6 mm noncalcified right lower lobe pulmonary nodule.  Pulmonary metastasis cannot be excluded.  Consider PET CT scan for further evaluation  CT ABDOMEN AND PELVIS  Findings:  Upper abdominal lymphadenopathy is seen gastrohepatic ligament measuring 2.9 x 3.8 cm on image 47.  No other pathologically enlarged nodes are seen within the abdomen or pelvis.  A hypovascular lesion is seen in the posterior segment right hepatic lobe measuring 1.5 cm on image 37.  This is suspicious for a liver metastasis.  No other liver lesions are identified.  The gallbladder, pancreas, spleen, adrenal glands, kidneys are normal in appearance.  No evidence of hydronephrosis.  No pelvic masses or lymphadenopathy identified.  No evidence of inflammatory process or abnormal fluid collections.  No suspicious bone lesions identified.  IMPRESSION:  1.  Metastatic upper abdominal lymphadenopathy in the gastrohepatic ligament. 2.  1.5  cm hypovascular lesion in the posterior segment of the right hepatic lobe, suspicious for liver metastasis.  Consider PET CT scan or abdomen MRI without and with contrast for further evaluation.   Original Report Authenticated By: Myles Rosenthal, M.D.   Ct Abdomen Pelvis W Contrast  11/13/2012   *RADIOLOGY REPORT*  Clinical Data:  Esophageal carcinoma.  CT CHEST, ABDOMEN AND PELVIS WITH CONTRAST  Technique:  Multidetector CT imaging of the chest, abdomen and pelvis was performed following the standard protocol during bolus administration of intravenous contrast.  Contrast: OMNIPAQUE IOHEXOL 300 MG/ML  SOLN  Comparison:   None.  CT CHEST  Findings:  A bulky annular soft tissue mass is seen involving the distal thoracic esophagus which measures approximately 3.2 x 4.0 x 5.9 cm.  Para esophageal lymphadenopathy is seen within the posterior mediastinum both anterior and posterior to the descending thoracic aorta.  Largest lymph node measures 1.5 cm on image 33. Mediastinal lymphadenopathy also seen in the right paratracheal region measuring 2.0 x 2.8 cm on image 10.  A 1.3 cm right hilar lymph node is seen with a small central focus of calcification. This could represent metastatic disease or partially calcified granulomatous disease.  No evidence of pleural or pericardial effusion. A 6 mm noncalcified pulmonary nodule is seen in the right lower lobe on image 29, which raises suspicion for pulmonary metastasis.  No other suspicious pulmonary nodules or masses are identified.  IMPRESSION:  1.  Bulky annular soft tissue mass involving the distal thoracic esophagus, consistent with known primary esophageal carcinoma. 2.  Metastatic lymphadenopathy in the posterior mediastinum and right paratracheal region.  Mild right hilar lymphadenopathy, possibly also secondary to metastatic disease. 3.  6 mm noncalcified right lower lobe pulmonary nodule.  Pulmonary metastasis cannot be excluded.  Consider PET CT scan for further  evaluation  CT ABDOMEN AND PELVIS  Findings:  Upper abdominal lymphadenopathy is seen gastrohepatic ligament measuring 2.9 x 3.8 cm on image 47.  No other pathologically enlarged nodes are seen within the abdomen or pelvis.  A hypovascular lesion is seen in the posterior segment right hepatic lobe measuring 1.5 cm on image 37.  This is suspicious for a liver metastasis.  No other liver lesions are identified.  The gallbladder, pancreas, spleen, adrenal glands, kidneys are normal in appearance.  No evidence of hydronephrosis.  No pelvic masses or lymphadenopathy identified.  No evidence of inflammatory process or abnormal fluid collections.  No suspicious bone lesions identified.  IMPRESSION:  1.  Metastatic upper abdominal lymphadenopathy in the gastrohepatic ligament. 2.  1.5 cm hypovascular lesion in the posterior segment of the right hepatic lobe, suspicious for liver metastasis.  Consider PET CT scan or abdomen MRI without and with contrast for further evaluation.   Original Report Authenticated By: Myles Rosenthal, M.D.    Scheduled Meds: . pantoprazole (PROTONIX) IV  40 mg Intravenous Q24H   Continuous Infusions: . dextrose 5 % and 0.9 % NaCl with KCl 20 mEq/L 125 mL/hr at 11/14/12 0941    Active Problems:   Dysphagia   Esophageal cancer   Protein-calorie malnutrition, severe    Time spent: 35    [facility], Barnie Sopko  [facility] Pager [phone]. If 7PM-7AM, please contact night-coverage at [email or web address], password [facility] 11/14/2012, 10:31 AM  LOS: 1 day

## 2012-11-14 NOTE — Progress Notes (Signed)
Agree.  As above, may be difficult to insufflate stomach for direct stick gastrostomy due to the nearly obstructing esophageal mass.  Will readdress after path result final and plans made for treatment of the esophageal lesion.

## 2012-11-15 NOTE — Progress Notes (Addendum)
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 Should be back Monday or Tuesday. IR for G tube placement.  The mass is obstructing his distal esophagus . Will consult oncology- once pathology back  CT scan of lung/abd/pelvis: 1. Bulky annular soft tissue mass involving the distal thoracic  esophagus, consistent with known primary esophageal carcinoma.  2. Metastatic lymphadenopathy in the posterior mediastinum and  right paratracheal region. Mild right hilar lymphadenopathy,  possibly also secondary to metastatic disease.  3. 6 mm noncalcified right lower lobe pulmonary nodule. Pulmonary  metastasis cannot be excluded. Consider PET CT scan for further  Evaluation  1. Metastatic upper abdominal lymphadenopathy in the gastrohepatic  ligament.  2. 1.5 cm hypovascular lesion in the posterior segment of the  right hepatic lobe, suspicious for liver metastasis. Consider PET  CT scan or abdomen MRI without and with contrast for further  evaluation.  Protein calorie malnutrition -G tube and then feeds    Code Status: full Family Communication: spoke with patient Disposition Plan: here until biopsy results are back   Consultants:  GI  IR  Oncology- call Monday  Procedures:  G tube per IR  EGD  Antibiotics:    HPI/Subjective: C/o epigastric pain  Objective: Filed Vitals:   11/14/12 0628 11/14/12 1445 11/14/12 2300 11/15/12 0600  BP: 130/78 143/72 135/74 127/70  Pulse: 69 72 72 66  Temp: 98.3 F (36.8 C) 97.9 F (36.6 C) 98.2 F (36.8 C) 98.1 F (36.7 C)  TempSrc: Oral Oral Oral Oral  Resp: 16 18 18 18   Height:      Weight:      SpO2: 99% 100% 100% 100%    Intake/Output Summary (Last 24 hours) at 11/15/12 1439 Last data filed at 11/14/12 1500  Gross per 24 hour  Intake  3437.5 ml  Output      0 ml  Net 3437.5 ml   Filed Weights   11/13/12 0306 11/13/12 1452  Weight: 55.339 kg (122 lb) 58.196 kg (128 lb 4.8 oz)    Exam:   General:  A+Ox3, NAD  Cardiovascular: rrr  Respiratory: clear anterior  Abdomen: +BS, soft  Musculoskeletal: moves all 4 ext  Data Reviewed: Basic Metabolic Panel:  Recent Labs Lab 11/13/12 0123 11/13/12 1709  NA 139 138  K 4.4 4.3  CL 101 106  CO2  --  25  GLUCOSE 100* 117*  BUN 9 7  CREATININE 1.10 0.92  CALCIUM  --  9.5   Liver Function Tests: No results found for this basename: AST, ALT, ALKPHOS, BILITOT, PROT, ALBUMIN,  in the last 168 hours No results found for this basename: LIPASE, AMYLASE,  in the last 168 hours No results found for this basename: AMMONIA,  in the last 168 hours CBC:  Recent Labs Lab 11/13/12 0107 11/13/12 0123 11/13/12 1709  WBC 7.9  --  6.3  HGB 12.5* 13.6 11.3*  HCT 37.2* 40.0 34.3*  MCV 83.0  --  83.5  PLT 209  --  212   Cardiac Enzymes: No results found for this basename: CKTOTAL, CKMB, CKMBINDEX, TROPONINI,  in the last 168 hours BNP (last 3 results) No results found for this basename: PROBNP,  in the last 8760 hours CBG: No results found for this basename: GLUCAP,  in the last 168 hours  No results found for this or any previous visit (from the  past 240 hour(s)).   Studies: Ct Chest W Contrast  11/13/2012   *RADIOLOGY REPORT*  Clinical Data:  Esophageal carcinoma.  CT CHEST, ABDOMEN AND PELVIS WITH CONTRAST  Technique:  Multidetector CT imaging of the chest, abdomen and pelvis was performed following the standard protocol during bolus administration of intravenous contrast.  Contrast: OMNIPAQUE IOHEXOL 300 MG/ML  SOLN  Comparison:   None.  CT CHEST  Findings:  A bulky annular soft tissue mass is seen involving the distal thoracic esophagus which measures approximately 3.2 x 4.0 x 5.9 cm.  Para esophageal lymphadenopathy is seen within the posterior mediastinum  both anterior and posterior to the descending thoracic aorta.  Largest lymph node measures 1.5 cm on image 33. Mediastinal lymphadenopathy also seen in the right paratracheal region measuring 2.0 x 2.8 cm on image 10. A 1.3 cm right hilar lymph node is seen with a small central focus of calcification. This could represent metastatic disease or partially calcified granulomatous disease.  No evidence of pleural or pericardial effusion. A 6 mm noncalcified pulmonary nodule is seen in the right lower lobe on image 29, which raises suspicion for pulmonary metastasis.  No other suspicious pulmonary nodules or masses are identified.  IMPRESSION:  1.  Bulky annular soft tissue mass involving the distal thoracic esophagus, consistent with known primary esophageal carcinoma. 2.  Metastatic lymphadenopathy in the posterior mediastinum and right paratracheal region.  Mild right hilar lymphadenopathy, possibly also secondary to metastatic disease. 3.  6 mm noncalcified right lower lobe pulmonary nodule.  Pulmonary metastasis cannot be excluded.  Consider PET CT scan for further evaluation  CT ABDOMEN AND PELVIS  Findings:  Upper abdominal lymphadenopathy is seen gastrohepatic ligament measuring 2.9 x 3.8 cm on image 47.  No other pathologically enlarged nodes are seen within the abdomen or pelvis.  A hypovascular lesion is seen in the posterior segment right hepatic lobe measuring 1.5 cm on image 37.  This is suspicious for a liver metastasis.  No other liver lesions are identified.  The gallbladder, pancreas, spleen, adrenal glands, kidneys are normal in appearance.  No evidence of hydronephrosis.  No pelvic masses or lymphadenopathy identified.  No evidence of inflammatory process or abnormal fluid collections.  No suspicious bone lesions identified.  IMPRESSION:  1.  Metastatic upper abdominal lymphadenopathy in the gastrohepatic ligament. 2.  1.5 cm hypovascular lesion in the posterior segment of the right hepatic lobe,  suspicious for liver metastasis.  Consider PET CT scan or abdomen MRI without and with contrast for further evaluation.   Original Report Authenticated By: Myles Rosenthal, M.D.   Ct Abdomen Pelvis W Contrast  11/13/2012   *RADIOLOGY REPORT*  Clinical Data:  Esophageal carcinoma.  CT CHEST, ABDOMEN AND PELVIS WITH CONTRAST  Technique:  Multidetector CT imaging of the chest, abdomen and pelvis was performed following the standard protocol during bolus administration of intravenous contrast.  Contrast: OMNIPAQUE IOHEXOL 300 MG/ML  SOLN  Comparison:   None.  CT CHEST  Findings:  A bulky annular soft tissue mass is seen involving the distal thoracic esophagus which measures approximately 3.2 x 4.0 x 5.9 cm.  Para esophageal lymphadenopathy is seen within the posterior mediastinum both anterior and posterior to the descending thoracic aorta.  Largest lymph node measures 1.5 cm on image 33. Mediastinal lymphadenopathy also seen in the right paratracheal region measuring 2.0 x 2.8 cm on image 10. A 1.3 cm right hilar lymph node is seen with a small central focus of calcification. This  could represent metastatic disease or partially calcified granulomatous disease.  No evidence of pleural or pericardial effusion. A 6 mm noncalcified pulmonary nodule is seen in the right lower lobe on image 29, which raises suspicion for pulmonary metastasis.  No other suspicious pulmonary nodules or masses are identified.  IMPRESSION:  1.  Bulky annular soft tissue mass involving the distal thoracic esophagus, consistent with known primary esophageal carcinoma. 2.  Metastatic lymphadenopathy in the posterior mediastinum and right paratracheal region.  Mild right hilar lymphadenopathy, possibly also secondary to metastatic disease. 3.  6 mm noncalcified right lower lobe pulmonary nodule.  Pulmonary metastasis cannot be excluded.  Consider PET CT scan for further evaluation  CT ABDOMEN AND PELVIS  Findings:  Upper abdominal  lymphadenopathy is seen gastrohepatic ligament measuring 2.9 x 3.8 cm on image 47.  No other pathologically enlarged nodes are seen within the abdomen or pelvis.  A hypovascular lesion is seen in the posterior segment right hepatic lobe measuring 1.5 cm on image 37.  This is suspicious for a liver metastasis.  No other liver lesions are identified.  The gallbladder, pancreas, spleen, adrenal glands, kidneys are normal in appearance.  No evidence of hydronephrosis.  No pelvic masses or lymphadenopathy identified.  No evidence of inflammatory process or abnormal fluid collections.  No suspicious bone lesions identified.  IMPRESSION:  1.  Metastatic upper abdominal lymphadenopathy in the gastrohepatic ligament. 2.  1.5 cm hypovascular lesion in the posterior segment of the right hepatic lobe, suspicious for liver metastasis.  Consider PET CT scan or abdomen MRI without and with contrast for further evaluation.   Original Report Authenticated By: Myles Rosenthal, M.D.    Scheduled Meds: . pantoprazole (PROTONIX) IV  40 mg Intravenous Q24H   Continuous Infusions: . dextrose 5 % and 0.9 % NaCl with KCl 20 mEq/L 125 mL/hr at 11/15/12 1042    Active Problems:   Dysphagia   Esophageal cancer   Protein-calorie malnutrition, severe    Time spent: 35    [facility], Gadiel John  [facility] Pager [phone]. If 7PM-7AM, please contact night-coverage at [email or web address], password [facility] 11/15/2012, 2:39 PM  LOS: 2 days

## 2012-11-16 ENCOUNTER — Encounter (HOSPITAL_COMMUNITY): Payer: Self-pay | Admitting: Gastroenterology

## 2012-11-16 MED ORDER — MORPHINE SULFATE 2 MG/ML IJ SOLN
1.0000 mg | INTRAMUSCULAR | Status: DC | PRN
  Administered 2012-11-16 – 2012-11-18 (×13): 1 mg via INTRAVENOUS
  Filled 2012-11-16 (×12): qty 1

## 2012-11-16 MED ORDER — CEFAZOLIN SODIUM-DEXTROSE 2-3 GM-% IV SOLR
2.0000 g | Freq: Once | INTRAVENOUS | Status: AC
  Administered 2012-11-17: 2 g via INTRAVENOUS
  Filled 2012-11-16: qty 50

## 2012-11-16 NOTE — Consult Note (Signed)
[facility]  [phone]   HEMATOLOGY ONCOLOGY CONSULTATION   Douglas Jordan  DOB: 15-Nov-1929  MR#: [id number]  CSN#: [id number]    Requesting Physician: [facility]  Primary MD: None  History of present illness:Esphageal Cancer        79 y.o. [place]   male, non English speaking, asked to see for evaluation of Esophageal Cancer.   In review, he presented with 3 month history of progressive dysphagia, initially with solids progressing to liquids, with subsequent 25 lb weight loss. Symptoms were accompanied by significant regurgitation. Workup included EGD by GI service on 11/13/2012, revealing a malignant appearing esophageal mass, which biopsies returned today, 7/14 positive for Squamous Cell Carcinoma.  CT of the chest ion 7/11 reveals Bulky annular soft tissue mass involving the distal thoracic esophagus, consistent with known primary esophageal carcinoma.  Metastatic lymphadenopathy in the posterior mediastinum and right paratracheal region. Mild right hilar lymphadenopathy, possibly also secondary to metastatic disease. and a 6 mm noncalcified right lower lobe pulmonary nodule. Also seen, Metastatic upper abdominal lymphadenopathy in the gastrohepatic  ligament. and a 1.5 cm hypovascular lesion in the posterior segment of the right hepatic lobe, suspicious for liver metastasis He is to have a  G-tube placement by IR today for nutritional support.     We were kindly requested to see the patient with recommendations.    Past medical history:      Past Medical History  Diagnosis Date  . GERD (gastroesophageal reflux disease) No knownn hx but, pt reports symptoms of mid sternal pain at xiphoid that is irriated when eating.  . Esophageal cancer 11/13/2012    Past surgical history:      Past Surgical History  Procedure Laterality Date  . Esophagogastroduodenoscopy (egd) with esophageal dilation N/A 11/13/2012    Procedure: ESOPHAGOGASTRODUODENOSCOPY  (EGD) WITH ESOPHAGEAL DILATION;  Surgeon: Rachael Fee, MD;  Location: [facility] ENDOSCOPY;  Service: Endoscopy;  Laterality: N/A;  . Savory dilation N/A 11/13/2012    Procedure: SAVORY DILATION;  Surgeon: Rachael Fee, MD;  Location: [facility] ENDOSCOPY;  Service: Endoscopy;  Laterality: N/A;    Medications:  Prior to Admission:  Prescriptions prior to admission  Medication Sig Dispense Refill  . OVER THE COUNTER MEDICATION Take 1 tablet by mouth once. OTC med for ulcer      . Probiotic Product (PROBIOTIC DAILY PO) Take 1 tablet by mouth once.        NWG:NFAOZHYQ injection, ondansetron (ZOFRAN) IV, ondansetron  Allergies: No Known Allergies  Family history: No apparent history of cancer in the family.                                         Social history:   Married. One daughter, Selena Batten, main caretaker. Lives in [place]. Full Code.   Review of systems:  See HPI for significant positives. Rest of the ROS negative. There is language barrier, but patient able to understand  very simple questions if accompanied by facial gestures/commands.     Physical exam:      Filed Vitals:   11/16/12 0543  BP: 128/78  Pulse: 66  Temp: 98.2 F (36.8 C)  Resp: 18     Body mass index is 24.25 kg/(m^2).   General: 79 y.o. male  in no acute distress A. and O. x3  well-developed and thin .Patient looks much younger than stated age. Language  barrier.  HEENT: Normocephalic, atraumatic, PERRLA. Oral cavity without thrush or lesions. Neck supple. no thyromegaly, no cervical or supraclavicular adenopathy  Lungs clear bilaterally . No wheezing, rhonchi or rales. No axillary masses. Breasts: not examined. Cardiac regular rate and rhythm normal S1-S2, no murmur , rubs or gallops Abdomen soft nontender , bowel sounds x4. No HSM. No masses palpable.  GU/rectal: deferred. Extremities no clubbing, no  cyanosis or edema. No bruising or petechial rash Musculoskeletal: no spinal tenderness.  Neuro: Non  Focal   Lab results:       CBC  Recent Labs Lab 11/13/12 0107 11/13/12 0123 11/13/12 1709  WBC 7.9  --  6.3  HGB 12.5* 13.6 11.3*  HCT 37.2* 40.0 34.3*  PLT 209  --  212  MCV 83.0  --  83.5  MCH 27.9  --  27.5  MCHC 33.6  --  32.9  RDW 14.1  --  14.2   Chemistries   Recent Labs Lab 11/13/12 0123 11/13/12 1709  NA 139 138  K 4.4 4.3  CL 101 106  CO2  --  25  GLUCOSE 100* 117*  BUN 9 7  CREATININE 1.10 0.92  CALCIUM  --  9.5     Coagulation profile  Recent Labs Lab 11/14/12 1100  INR 1.12   Studies:      Ct Chest W Contrast  11/13/2012   *RADIOLOGY REPORT*  Clinical Data:  Esophageal carcinoma.  CT CHEST, ABDOMEN AND PELVIS WITH CONTRAST  Technique:  Multidetector CT imaging of the chest, abdomen and pelvis was performed following the standard protocol during bolus administration of intravenous contrast.  Contrast: OMNIPAQUE IOHEXOL 300 MG/ML  SOLN  Comparison:   None.  CT CHEST  Findings:  A bulky annular soft tissue mass is seen involving the distal thoracic esophagus which measures approximately 3.2 x 4.0 x 5.9 cm.  Para esophageal lymphadenopathy is seen within the posterior mediastinum both anterior and posterior to the descending thoracic aorta.  Largest lymph node measures 1.5 cm on image 33. Mediastinal lymphadenopathy also seen in the right paratracheal region measuring 2.0 x 2.8 cm on image 10. A 1.3 cm right hilar lymph node is seen with a small central focus of calcification. This could represent metastatic disease or partially calcified granulomatous disease.  No evidence of pleural or pericardial effusion. A 6 mm noncalcified pulmonary nodule is seen in the right lower lobe on image 29, which raises suspicion for pulmonary metastasis.  No other suspicious pulmonary nodules or masses are identified.  IMPRESSION:  1.  Bulky annular soft tissue mass involving the distal thoracic esophagus, consistent with known primary esophageal carcinoma. 2.  Metastatic  lymphadenopathy in the posterior mediastinum and right paratracheal region.  Mild right hilar lymphadenopathy, possibly also secondary to metastatic disease. 3.  6 mm noncalcified right lower lobe pulmonary nodule.  Pulmonary metastasis cannot be excluded.  Consider PET CT scan for further evaluation    Ct Abdomen Pelvis W Contrast  7/11/2014CT ABDOMEN AND PELVIS  Findings:  Upper abdominal lymphadenopathy is seen gastrohepatic ligament measuring 2.9 x 3.8 cm on image 47.  No other pathologically enlarged nodes are seen within the abdomen or pelvis.  A hypovascular lesion is seen in the posterior segment right hepatic lobe measuring 1.5 cm on image 37.  This is suspicious for a liver metastasis.  No other liver lesions are identified.  The gallbladder, pancreas, spleen, adrenal glands, kidneys are normal in appearance.  No evidence of hydronephrosis.  No pelvic  masses or lymphadenopathy identified.  No evidence of inflammatory process or abnormal fluid collections.  No suspicious bone lesions identified.  IMPRESSION:  1.  Metastatic upper abdominal lymphadenopathy in the gastrohepatic ligament. 2.  1.5 cm hypovascular lesion in the posterior segment of the right hepatic lobe, suspicious for liver metastasis.  Consider PET CT scan or abdomen MRI without and with contrast for further evaluation.   Original Report Authenticated By: Myles Rosenthal, M.D.    Assessmnent/Plan:79 y.o. male asked to see for new diagnosis of Squamous Cell Carcinoma biopsy proven , after being found to have an esophageal mass, as well as lymphadenopathy and possible liver mets.   Dr. Karel Jarvis is to see the patient following this consult with recommendations regarding diagnosis, treatment options and further workup studies which may include a PET scan as outpatient. .  An addendum to this note is to be written. Thank you for the referral.   Douglas Eke, PA-C 11/16/2012   Patient was seen and examined.  Locally advanced SCC of the  esophagus; however, CT scan showing single liver lesion. Will need further evaluation of the liver to confirm if the patient has metastatic diease vs locally advanced, may consider obtaining MRI of the liver or PET scan.  -Need nutritional support and J-tube placement. - Bronch to r/o fistula - IV access (port placement). -F/u in the clinic once he is D/ced for final plan but If locally advanced, will plan for concurrent chemo-rad first then evaluation for surgical resection. If metastatic diease, he will be receiving systemic chemotherapy.   Zachery Dakins, MD @T @ 4:48 PM

## 2012-11-16 NOTE — Progress Notes (Signed)
Patient ID: Douglas Jordan, male   DOB: Oct 25, 1929, 79 y.o.   MRN: [id number]   Await path reports from endo  For perc G tube soon Pt is on IR radar  Will prepare for G tube after review of path Direct stick in CT vs IR per Dr Archer Asa

## 2012-11-16 NOTE — Progress Notes (Addendum)
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 Should be back Monday or Tuesday. IR for G tube placement.  The mass is obstructing his distal esophagus . Path back as squamous cell- oncology called- Dr. Karel Jarvis  CT scan of lung/abd/pelvis: 1. Bulky annular soft tissue mass involving the distal thoracic  esophagus, consistent with known primary esophageal carcinoma.  2. Metastatic lymphadenopathy in the posterior mediastinum and  right paratracheal region. Mild right hilar lymphadenopathy,  possibly also secondary to metastatic disease.  3. 6 mm noncalcified right lower lobe pulmonary nodule. Pulmonary  metastasis cannot be excluded. Consider PET CT scan for further  Evaluation  1. Metastatic upper abdominal lymphadenopathy in the gastrohepatic  ligament.  2. 1.5 cm hypovascular lesion in the posterior segment of the  right hepatic lobe, suspicious for liver metastasis. Consider PET  CT scan or abdomen MRI without and with contrast for further  evaluation.  Protein calorie malnutrition -G tube and then feeds - IVF    Code Status: full Family Communication: spoke with patient Disposition Plan: here until biopsy results are back   Consultants:  GI  IR  Oncology- call Monday  Procedures:  G tube per IR  EGD  Antibiotics:    HPI/Subjective: C/o epigastric pain   Objective: Filed Vitals:   11/15/12 0600 11/15/12 1520 11/15/12 2124 11/16/12 0543  BP: 127/70 135/76 138/71 128/78  Pulse: 66 69 67 66  Temp: 98.1 F (36.7 C) 98.2 F (36.8 C) 98.7 F (37.1 C) 98.2 F (36.8 C)  TempSrc: Oral Oral Oral Oral  Resp: 18 16 18 18   Height:      Weight:      SpO2: 100% 100% 100% 100%    Intake/Output Summary (Last 24 hours) at 11/16/12 0927 Last data filed at 11/16/12 0500  Gross  per 24 hour  Intake   4750 ml  Output      0 ml  Net   4750 ml   Filed Weights   11/13/12 0306 11/13/12 1452  Weight: 55.339 kg (122 lb) 58.196 kg (128 lb 4.8 oz)    Exam:   General:  A+Ox3, NAD  Cardiovascular: rrr  Respiratory: clear anterior  Abdomen: +BS, soft  Musculoskeletal: moves all 4 ext  Data Reviewed: Basic Metabolic Panel:  Recent Labs Lab 11/13/12 0123 11/13/12 1709  NA 139 138  K 4.4 4.3  CL 101 106  CO2  --  25  GLUCOSE 100* 117*  BUN 9 7  CREATININE 1.10 0.92  CALCIUM  --  9.5   Liver Function Tests: No results found for this basename: AST, ALT, ALKPHOS, BILITOT, PROT, ALBUMIN,  in the last 168 hours  Recent Labs Lab 11/15/12 1547  LIPASE 16   No results found for this basename: AMMONIA,  in the last 168 hours CBC:  Recent Labs Lab 11/13/12 0107 11/13/12 0123 11/13/12 1709  WBC 7.9  --  6.3  HGB 12.5* 13.6 11.3*  HCT 37.2* 40.0 34.3*  MCV 83.0  --  83.5  PLT 209  --  212   Cardiac Enzymes: No results found for this basename: CKTOTAL, CKMB, CKMBINDEX, TROPONINI,  in the last 168 hours BNP (last 3 results) No results found for this basename: PROBNP,  in the last 8760 hours CBG: No results found for this basename: GLUCAP,  in the last 168 hours  No results found for  this or any previous visit (from the past 240 hour(s)).   Studies: No results found.  Scheduled Meds: . pantoprazole (PROTONIX) IV  40 mg Intravenous Q24H   Continuous Infusions: . dextrose 5 % and 0.9 % NaCl with KCl 20 mEq/L 125 mL/hr at 11/15/12 1909    Active Problems:   Dysphagia   Esophageal cancer   Protein-calorie malnutrition, severe    Time spent: 35    [facility], [place]  [facility] Pager [phone]. If 7PM-7AM, please contact night-coverage at [email or web address], password [facility] 11/16/2012, 9:27 AM  LOS: 3 days

## 2012-11-16 NOTE — H&P (Signed)
Douglas Jordan is an 79 y.o. male.   Chief Complaint: esophageal ca 3 month hx progressive dysphagia; wt loss Regurgitation Endo reveals bulky distal espoph mass: + squamous cell carcinoma Scheduled for percutaneous gastric tube placement Will be direct stick procedure; no barium  Discussed with Dr Archer Asa; Rad to determine if able to insufflate stomachfor Fluoro guidance vs CT guidance  HPI: GERD; Esoph Ca  Past Medical History  Diagnosis Date  . GERD (gastroesophageal reflux disease) No knownn hx but, pt reports symptoms of mid sternal pain at xiphoid that is irriated when eating.  . Esophageal cancer 11/13/2012    Past Surgical History  Procedure Laterality Date  . Esophagogastroduodenoscopy (egd) with esophageal dilation N/A 11/13/2012    Procedure: ESOPHAGOGASTRODUODENOSCOPY (EGD) WITH ESOPHAGEAL DILATION;  Surgeon: Rachael Fee, MD;  Location: [facility] ENDOSCOPY;  Service: Endoscopy;  Laterality: N/A;  . Savory dilation N/A 11/13/2012    Procedure: SAVORY DILATION;  Surgeon: Rachael Fee, MD;  Location: [facility] ENDOSCOPY;  Service: Endoscopy;  Laterality: N/A;    No family history on file. Social History:  reports that he does not drink alcohol or use illicit drugs. His tobacco history is not on file.  Allergies: No Known Allergies  Medications Prior to Admission  Medication Sig Dispense Refill  . OVER THE COUNTER MEDICATION Take 1 tablet by mouth once. OTC med for ulcer      . Probiotic Product (PROBIOTIC DAILY PO) Take 1 tablet by mouth once.        Results for orders placed during the hospital encounter of 11/13/12 (from the past 48 hour(s))  LIPASE, BLOOD     Status: None   Collection Time    11/15/12  3:47 PM      Result Value Range   Lipase 16  11 - 59 U/L   No results found.  Review of Systems  Constitutional: Positive for weight loss. Negative for fever.  Respiratory: Negative for shortness of breath.   Cardiovascular: Negative for chest pain.  Gastrointestinal:  Positive for nausea, vomiting and abdominal pain.  Musculoskeletal: Positive for back pain.  Neurological: Positive for weakness. Negative for dizziness and headaches.    Blood pressure 134/80, pulse 79, temperature 98.2 F (36.8 C), temperature source Oral, resp. rate 18, height 5\' 1"  (1.549 m), weight 128 lb 4.8 oz (58.196 kg), SpO2 100.00%. Physical Exam  Constitutional: He is oriented to person, place, and time.  frail  Cardiovascular: Normal rate, regular rhythm and normal heart sounds.   No murmur heard. Respiratory: Effort normal and breath sounds normal. He has no wheezes.  GI: Soft. Bowel sounds are normal. There is tenderness.  Musculoskeletal: Normal range of motion.  Neurological: He is alert and oriented to person, place, and time.  Non English speaking  Skin: Skin is warm and dry.  Psychiatric:  Consent signed and in chart with dtr via phone     Assessment/Plan Wt loss; progressive dysphagia x 3 mo; regurgitation Endo reveals mass: +Sq cell ca For perc G tube in am Direct stick Discussed with dtr via phone Aware of procedure benefits and risks ans agreeable to proceed Consent signed and in chart Ancef on call Labs wnl  Todd Argabright A 11/16/2012, 3:17 PM

## 2012-11-16 NOTE — H&P (Signed)
Agree with PA note.  If able to get a Kumpe catheter past the obstructing esophageal mass, can do  a push-through G-tube in IR.  If unable to get past the mass, may require direct percutaneous access under CT guidance.   Signed,  Sterling Big, MD Vascular & Interventional Radiologist [facility] Radiology

## 2012-11-17 ENCOUNTER — Inpatient Hospital Stay (HOSPITAL_COMMUNITY): Payer: MEDICAID

## 2012-11-17 ENCOUNTER — Encounter (HOSPITAL_COMMUNITY): Payer: Self-pay | Admitting: *Deleted

## 2012-11-17 LAB — BASIC METABOLIC PANEL
BUN: 4 mg/dL — ABNORMAL LOW (ref 6–23)
CO2: 25 mEq/L (ref 19–32)
Chloride: 104 mEq/L (ref 96–112)
Creatinine, Ser: 0.96 mg/dL (ref 0.50–1.35)
GFR calc Af Amer: 88 mL/min — ABNORMAL LOW (ref >90)
Glucose, Bld: 101 mg/dL — ABNORMAL HIGH (ref 70–99)
Potassium: 4.6 mEq/L (ref 3.5–5.1)

## 2012-11-17 MED ORDER — FENTANYL CITRATE 0.05 MG/ML IJ SOLN
INTRAMUSCULAR | Status: AC | PRN
  Administered 2012-11-17 (×2): 25 ug via INTRAVENOUS
  Administered 2012-11-17: 50 ug via INTRAVENOUS

## 2012-11-17 MED ORDER — IOHEXOL 300 MG/ML  SOLN
50.0000 mL | Freq: Once | INTRAMUSCULAR | Status: AC | PRN
  Administered 2012-11-17: 25 mL

## 2012-11-17 MED ORDER — MIDAZOLAM HCL 2 MG/2ML IJ SOLN
INTRAMUSCULAR | Status: AC
  Filled 2012-11-17: qty 4

## 2012-11-17 MED ORDER — GADOBENATE DIMEGLUMINE 529 MG/ML IV SOLN
15.0000 mL | Freq: Once | INTRAVENOUS | Status: AC | PRN
  Administered 2012-11-17: 12 mL via INTRAVENOUS

## 2012-11-17 MED ORDER — GLUCAGON HCL (RDNA) 1 MG IJ SOLR
INTRAMUSCULAR | Status: AC
  Filled 2012-11-17: qty 1

## 2012-11-17 MED ORDER — BACITRACIN-NEOMYCIN-POLYMYXIN 400-5-5000 EX OINT
TOPICAL_OINTMENT | CUTANEOUS | Status: AC
  Administered 2012-11-17: 1
  Filled 2012-11-17: qty 1

## 2012-11-17 MED ORDER — FENTANYL CITRATE 0.05 MG/ML IJ SOLN
INTRAMUSCULAR | Status: AC
  Filled 2012-11-17: qty 4

## 2012-11-17 MED ORDER — GLUCAGON HCL (RDNA) 1 MG IJ SOLR
INTRAMUSCULAR | Status: AC | PRN
  Administered 2012-11-17: 1 mg via INTRAVENOUS

## 2012-11-17 MED ORDER — MIDAZOLAM HCL 2 MG/2ML IJ SOLN
INTRAMUSCULAR | Status: AC | PRN
  Administered 2012-11-17 (×3): 1 mg via INTRAVENOUS

## 2012-11-17 NOTE — Procedures (Signed)
Successful fluoroscopic guided insertion of gastrostomy tube without immediate post procedural complicatoin.   The gastrostomy tube may be used immediately for medications.  Otherwise, place gastrostomy tube to low wall suction for 24 hrs.  Tube feeds may be initiated in 24 hours as per the primary team.   

## 2012-11-17 NOTE — Progress Notes (Signed)
NUTRITION FOLLOW UP  DOCUMENTATION CODES Per approved criteria  -Severe malnutrition in the context of acute illness or injury   Intervention:    Once G-tube ready to be used, recommend initiation of Osmolite 1.2 formula at 20 ml/hr and increase by 10 ml every 12 hours to goal rate of 60 ml/hr with Prostat liquid protein 30 ml twice daily to provide 1928 total kcals, 110 gm protein, 1181 ml of free water  Monitor Mg, Phos, K+ levels RD to follow for nutrition care plan  Nutrition Dx:   Inadequate oral intake related to inability to eat as evidenced by NPO status, ongoing  New Goal:   EN support to meet > 90% of estimated nutrition needs, currently unmet  Monitor:   EN initiation & tolerance, weight, labs, I/O's  Assessment:   Patient with PMH of GERD presented with dysphagia and odynophagia; has been feeling solid food get stuck about 3 months ago; evaluation in ER included a normal Hb of 13.6 ---> GI was consulted and hospitalist was asked to accept patient to [facility] for admission.  EGD with biopsy completed 7/11.  Impression was malignant esophageal mass obstructing acces to patient's stomach.  For G-tube placement today in IR.  Oncology following.  Patient is at risk for refeeding syndrome with EN initiation & advancement given severe malnutrition.  Height: Ht Readings from Last 1 Encounters:  11/13/12 5\' 1"  (1.549 m)    Weight Status:   Wt Readings from Last 1 Encounters:  11/13/12 128 lb 4.8 oz (58.196 kg)    Re-estimated needs:  Kcal: 1800-2000 Protein: 105-115 gm Fluid: 1.8-2.0 L  Skin: Intact  Diet Order: NPO   Intake/Output Summary (Last 24 hours) at 11/17/12 1125 Last data filed at 11/17/12 0600  Gross per 24 hour  Intake   2352 ml  Output      0 ml  Net   2352 ml    Labs:   Recent Labs Lab 11/13/12 0123 11/13/12 1709  NA 139 138  K 4.4 4.3  CL 101 106  CO2  --  25  BUN 9 7  CREATININE 1.10 0.92  CALCIUM  --  9.5  GLUCOSE 100* 117*     Scheduled Meds: .  ceFAZolin (ANCEF) IV  2 g Intravenous Once  . pantoprazole (PROTONIX) IV  40 mg Intravenous Q24H    Continuous Infusions: . dextrose 5 % and 0.9 % NaCl with KCl 20 mEq/L 125 mL/hr at 11/17/12 0307    Maureen Chatters, RD, LDN Pager #: [phone] After-Hours Pager #: [phone]

## 2012-11-17 NOTE — Progress Notes (Signed)
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 Should be back Monday or Tuesday. IR for G tube placement.  The mass is obstructing his distal esophagus . Path back as squamous cell- oncology called- Dr. Karel Jarvis  MRI of liver ordered and if + for metastasis, will get biopsy (check BMP this AM since MRI with contrast)  CT scan of lung/abd/pelvis: 1. Bulky annular soft tissue mass involving the distal thoracic  esophagus, consistent with known primary esophageal carcinoma.  2. Metastatic lymphadenopathy in the posterior mediastinum and  right paratracheal region. Mild right hilar lymphadenopathy,  possibly also secondary to metastatic disease.  3. 6 mm noncalcified right lower lobe pulmonary nodule. Pulmonary  metastasis cannot be excluded. Consider PET CT scan for further  Evaluation  1. Metastatic upper abdominal lymphadenopathy in the gastrohepatic  ligament.  2. 1.5 cm hypovascular lesion in the posterior segment of the  right hepatic lobe, suspicious for liver metastasis. Consider PET  CT scan or abdomen MRI without and with contrast for further  evaluation.   Protein calorie malnutrition -G tube today and then feeds - IVF until tube feeds    Code Status: full Family Communication: spoke with patient/family Disposition Plan: MRI and G tube placement then follow up with oncology   Consultants:  GI  IR  Oncology  Procedures:  G tube per IR  EGD  Antibiotics:    HPI/Subjective: C/o epigastric pain   Objective: Filed Vitals:   11/16/12 0543 11/16/12 1344 11/16/12 2247 11/17/12 0538  BP: 128/78 134/80 145/74 128/66  Pulse: 66 79 75 69  Temp: 98.2 F (36.8 C) 98.2 F (36.8 C) 98.4 F (36.9 C) 98.4 F (36.9 C)  TempSrc: Oral Oral Oral Oral  Resp: 18 18 16 17   Height:       Weight:      SpO2: 100% 100% 99% 99%    Intake/Output Summary (Last 24 hours) at 11/17/12 1024 Last data filed at 11/17/12 0600  Gross per 24 hour  Intake   2352 ml  Output      0 ml  Net   2352 ml   Filed Weights   11/13/12 0306 11/13/12 1452  Weight: 55.339 kg (122 lb) 58.196 kg (128 lb 4.8 oz)    Exam:   General:  Pleasant/cooperative  Cardiovascular: rrr  Respiratory: clear anterior  Abdomen: +BS, soft, min tenderness  Musculoskeletal: moves all 4 ext  Data Reviewed: Basic Metabolic Panel:  Recent Labs Lab 11/13/12 0123 11/13/12 1709  NA 139 138  K 4.4 4.3  CL 101 106  CO2  --  25  GLUCOSE 100* 117*  BUN 9 7  CREATININE 1.10 0.92  CALCIUM  --  9.5   Liver Function Tests: No results found for this basename: AST, ALT, ALKPHOS, BILITOT, PROT, ALBUMIN,  in the last 168 hours  Recent Labs Lab 11/15/12 1547  LIPASE 16   No results found for this basename: AMMONIA,  in the last 168 hours CBC:  Recent Labs Lab 11/13/12 0107 11/13/12 0123 11/13/12 1709  WBC 7.9  --  6.3  HGB 12.5* 13.6 11.3*  HCT 37.2* 40.0 34.3*  MCV 83.0  --  83.5  PLT 209  --  212   Cardiac Enzymes: No results found for this basename: CKTOTAL, CKMB, CKMBINDEX, TROPONINI,  in the last 168 hours BNP (last 3 results) No results found  for this basename: PROBNP,  in the last 8760 hours CBG: No results found for this basename: GLUCAP,  in the last 168 hours  No results found for this or any previous visit (from the past 240 hour(s)).   Studies: No results found.  Scheduled Meds: .  ceFAZolin (ANCEF) IV  2 g Intravenous Once  . pantoprazole (PROTONIX) IV  40 mg Intravenous Q24H   Continuous Infusions: . dextrose 5 % and 0.9 % NaCl with KCl 20 mEq/L 125 mL/hr at 11/17/12 0307    Active Problems:   Dysphagia   Esophageal cancer   Protein-calorie malnutrition, severe    Time spent: 35    [facility], Eleanora Guinyard  [facility] Pager [phone]. If 7PM-7AM, please  contact night-coverage at [email or web address], password [facility] 11/17/2012, 10:24 AM  LOS: 4 days

## 2012-11-18 LAB — BASIC METABOLIC PANEL
BUN: 4 mg/dL — ABNORMAL LOW (ref 6–23)
Chloride: 105 mEq/L (ref 96–112)
Creatinine, Ser: 1.08 mg/dL (ref 0.50–1.35)
GFR calc Af Amer: 72 mL/min — ABNORMAL LOW (ref >90)
GFR calc non Af Amer: 62 mL/min — ABNORMAL LOW (ref >90)
Glucose, Bld: 117 mg/dL — ABNORMAL HIGH (ref 70–99)
Potassium: 4.5 mEq/L (ref 3.5–5.1)

## 2012-11-18 LAB — CBC
HCT: 35 % — ABNORMAL LOW (ref 39.0–52.0)
Hemoglobin: 11.8 g/dL — ABNORMAL LOW (ref 13.0–17.0)
MCHC: 33.7 g/dL (ref 30.0–36.0)
MCV: 81.6 fL (ref 78.0–100.0)
RDW: 13.9 % (ref 11.5–15.5)

## 2012-11-18 MED ORDER — PRO-STAT SUGAR FREE PO LIQD
30.0000 mL | Freq: Two times a day (BID) | ORAL | Status: DC
  Administered 2012-11-19 – 2012-11-22 (×6): 30 mL
  Filled 2012-11-18 (×9): qty 30

## 2012-11-18 MED ORDER — MORPHINE SULFATE 10 MG/5ML PO SOLN
5.0000 mg | ORAL | Status: DC | PRN
  Administered 2012-11-18 – 2012-11-22 (×10): 5 mg via ORAL
  Filled 2012-11-18 (×9): qty 5
  Filled 2012-11-18: qty 10

## 2012-11-18 MED ORDER — OSMOLITE 1.2 CAL PO LIQD
1000.0000 mL | ORAL | Status: DC
  Administered 2012-11-18 – 2012-11-21 (×3): 1000 mL
  Filled 2012-11-18 (×4): qty 1000

## 2012-11-18 NOTE — Progress Notes (Signed)
5 Days Post-Op  Subjective: Direct stick G tube was placed 7/15 Doing well Sore to pt   Objective: Vital signs in last 24 hours: Temp:  [97.8 F (36.6 C)-98.7 F (37.1 C)] 98.7 F (37.1 C) (07/16 0417) Pulse Rate:  [78-92] 78 (07/16 0417) Resp:  [16-20] 16 (07/16 0417) BP: (115-165)/(67-96) 143/76 mmHg (07/16 0417) SpO2:  [93 %-100 %] 100 % (07/16 0417) Last BM Date: 11/14/12  Intake/Output from previous day: 07/15 0701 - 07/16 0700 In: 2371.6 [I.V.:2371.6] Out: 900 [Urine:900] Intake/Output this shift:    PE: afeb; vss Wbc wnl Site clean and dry; T tacs in place Very minimal bleeding at site- dried Suction ongoing- brownish fluid +BS   Lab Results:   Recent Labs  11/18/12 0550  WBC 5.8  HGB 11.8*  HCT 35.0*  PLT 207   BMET  Recent Labs  11/17/12 1215 11/18/12 0550  NA 137 137  K 4.6 4.5  CL 104 105  CO2 25 28  GLUCOSE 101* 117*  BUN 4* 4*  CREATININE 0.96 1.08  CALCIUM 9.9 9.5   PT/INR No results found for this basename: LABPROT, INR,  in the last 72 hours ABG No results found for this basename: PHART, PCO2, PO2, HCO3,  in the last 72 hours  Studies/Results: Ir Gastrostomy Tube Mod Sed  11/17/2012   *RADIOLOGY REPORT*  Indication:  Esophageal mass, unable to place gastrostomy tube via endoscopic approach  "PUSH" PERCUTANEOUS GASTOSTOMY TUBE PLACEMENT  Comparison: CT abdomen pelvis - 11/13/2012; abdominal MRI - earlier same day  Medications:  Versed 3 mg IV; Fentanyl 100 mcg IV; Glucagon 1 gm IV; Ancef 2 grams IV; Antibiotics were administered within 1 hour of the procedure.  Contrast volume:  25 mL Omnipaque-300 administered into the gastric lumen  Sedation time: 45 minutes  Fluoroscopy time: 9 minutes, 6 seconds  Complications: None immediate  PROCEDURE/FINDINGS:  Informed written consent was obtained from the patient and the patient's daughter via the use of a Medical translator following explanation of the procedure, risks, benefits and  alternatives.  A time out was performed prior to the initiation of the procedure. Ultrasound scanning was performed to demarcate the edge of the left lobe of the liver.  Maximal barrier sterile technique utilized including caps, mask, sterile gowns, sterile gloves, large sterile drape, hand hygiene and Betadine prep.  The left upper quadrant was sterilely prepped and draped.  An oral gastric catheter was inserted into the stomach under fluoroscopy. The left costal margin and air opacified transverse colon were identified and avoided.  Air was injected into the stomach for insufflation and visualization under fluoroscopy.  Under sterile conditions 3 T-tacts were deployed to pexy the stomach to the anterior abdominal wall percutaneously beneath the left subcostal margin after the overlying soft tissues were anesthetized with 1% Lidocaine with epinephrine.  Each needle position was confirmed within the stomach with aspiration of air and injection of small amount of contrast.  Multiple spot fluoroscopic images were obtained to confirm appropriate intraluminal positioning.  An incision was made between the T tacks and an 18 gauge needle was utilized to access the stomach.  Again, a small amount of air was aspirated and a small amount of contrast was injected confirming appropriate intraluminal positioning.  Over an Amplatz guide wire, the access needle was exchanged for a Kumpe catheter.   With the use of a stiff Glidewire, the Kumpe catheter was manipulated into the descending portion of the duodenum.  Contrast injection confirmed appropriate positioning.  Under intermittent fluoroscopic guidance, Kumpe catheter was exchanged for the telescoping peel away sheath.  This ultimately allowed placement of e 20-French balloon retention gastrostomy tube.   The balloon was insufflated within the gastric lumen and pulled taut against the ventral wall of the stomach.  Contrast injection confirms appropriate positioning within the  stomach. Several spot radiographic images were obtained in various obliquities for documentation.  The patient tolerated procedure well without immediate post procedural complication.  IMPRESSION:  Successful fluoroscopic insertion of an 18-French "push" gastrostomy.   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 Liver lesion on CT.  MRI ABDOMEN WITHOUT AND WITH CONTRAST  Technique:  Multiplanar multisequence MR imaging of the abdomen was performed both before and after the administration of intravenous contrast.  Contrast: 12mL MULTIHANCE GADOBENATE DIMEGLUMINE 529 MG/ML IV SOLN  Comparison:  CTs of the chest, abdomen and pelvis 11/13/2012.  Findings:  Study is mildly motion degraded, especially on the postcontrast images.  The previously demonstrated lesion within the posterior dome of the right hepatic lobe measures 1.4 cm in diameter.  This lesion demonstrates homogeneous low T1 and heterogeneous T2 signal.  There is associated restricted diffusion. Postcontrast, this lesion demonstrates peripheral enhancement which appears continuous.  This is at least moderately suspicious for a metastasis.  No other liver lesions are identified.  The previously demonstrated infiltrative mass involving the distal esophagus appears grossly unchanged.  Adjacent distal para esophageal and celiac lymph nodes are unchanged, worrisome for metastatic disease.  There is a right lower lobe pulmonary nodule which was seen on the CT.  The spleen, adrenal glands, gallbladder, pancreas and kidneys appear normal.  There are no worrisome osseous findings.  IMPRESSION:  1.  Again demonstrated are an infiltrative mass of the distal esophagus with enlarged distal para esophageal and celiac nodes worrisome for nodal metastases. 2.  The previously demonstrated lesion in the posterior  segment of the right hepatic lobe has MR features which are at least moderately suspicious for a metastasis.  Consider tissue sampling. This may be difficult to biopsy percutaneously given its position in the dome of the right hepatic lobe.  Therefore, PET CT might prove helpful. 3.  Right lower lobe pulmonary nodule as seen on CT, also suspicious for a possible metastasis.   Original Report Authenticated By: Carey Bullocks, M.D.    Anti-infectives: Anti-infectives   Start     Dose/Rate Route Frequency Ordered Stop   11/17/12 0600  ceFAZolin (ANCEF) IVPB 2 g/50 mL premix    Comments:  Hang on call to xray 7/15   2 g 100 mL/hr over 30 Minutes Intravenous  Once 11/16/12 1517 11/17/12 1150      Assessment/Plan: s/p Procedure(s): ESOPHAGOGASTRODUODENOSCOPY (EGD) WITH ESOPHAGEAL DILATION (N/A) SAVORY DILATION (N/A)  G tube in place May use now T Tacs need to be removed within 7-10 days We can remove in IR or may be removed at [facility] OP removal is fine   LOS: 5 days    Kortlyn Koltz A 11/18/2012

## 2012-11-18 NOTE — Care Management Note (Signed)
  Page 2 of 2   11/18/2012     4:21:12 PM   CARE MANAGEMENT NOTE 11/18/2012  Patient:  Douglas Jordan,Douglas Jordan   Account Number:  [id number]  Date Initiated:  11/16/2012  Documentation initiated by:  Ronny Flurry  Subjective/Objective Assessment:     Action/Plan:   Anticipated DC Date:  11/20/2012   Anticipated DC Plan:  HOME W HOME HEALTH SERVICES  In-house referral  Financial Counselor         Choice offered to / List presented to:     DME arranged  TUBE FEEDING  TUBE FEEDING PUMP      DME agency  [facility]     [facility] arranged  HH-1 RN      [facility] agency  [facility]   Status of service:  Completed, signed off Medicare Important Message given?   (If response is "NO", the following Medicare IM given date fields will be blank) Date Medicare IM given:   Date Additional Medicare IM given:    Discharge Disposition:    Per UR Regulation:    If discussed at Long Length of Stay Meetings, dates discussed:    Comments:  11-18-12 Follow up appointment at [facility] located at [address] [phone] on Wednesday, 11/25/12 at 11:45 information added to discharge instructions  Ronny Flurry RN BSN [phone]    11-18-12 Face sheet information confirmed with patient's daughter Douglas Jordan ( [phone])  . Patient and his wife live with their son Douglas Jordan ( face sheet information is Douglas Jordan's ) . Douglas Jordan interprets for his parents.  Douglas Jordan has her own business , she can be available to assist parents and be present for home health visits .  Referral made to [facility] . Douglas Jordan wanting to know cost of tube feeds and HHRN . Explained [facility] will discuss that with her .   Ronny Flurry RN BSN [phone]    11-18-12 Emailed Fay Records at [facility] to schedule hospital follow up appointment for patient . Ronny Flurry RN BSN [phone]

## 2012-11-18 NOTE — Progress Notes (Addendum)
NUTRITION CONSULT/FOLLOW UP  DOCUMENTATION CODES Per approved criteria  -Severe malnutrition in the context of acute illness or injury   Intervention:    Initiate at 1600 ---> Osmolite 1.2 formula at 20 ml/hr, increase by 10 ml every 12 hours to goal rate of 60 ml/hr with Prostat liquid protein 30 ml twice daily to provide 1928 total kcals, 110 gm protein, 1181 ml of free water  Monitor Mg, Phos, K+ levels  RD to follow for nutrition care plan  Nutrition Dx:   Inadequate oral intake related to inability to eat, esophageal mass as evidenced by NPO status, ongoing  Goal:   EN support to meet > 90% of estimated nutrition needs, progresing  Monitor:   EN regimen & tolerance, weight, labs, I/O's  Assessment:   Patient with PMH of GERD presented with dysphagia and odynophagia; has been feeling solid food get stuck about 3 months ago; evaluation in ER included a normal Hb of 13.6 ---> GI was consulted and hospitalist was asked to accept patient to [facility] for admission  EGD with biopsy completed 7/11. Impression was malignant esophageal mass obstructing acces to patient's stomach. G-tube placed 7/15, ready to use at 1600. Oncology following.  RD spoke with Dr. Benjamine Mola regarding nutrition care plan.  Will add Adult Tube Feeding Protocol order set.  Height: Ht Readings from Last 1 Encounters:  11/13/12 5\' 1"  (1.549 m)    Weight Status:   Wt Readings from Last 1 Encounters:  11/13/12 128 lb 4.8 oz (58.196 kg)    Re-estimated needs:  Kcal: 1800-2000 Protein: 105-115 gm Fluid: 1.8-2.0 L  Skin: Intact  Diet Order: Clear Liquid   Intake/Output Summary (Last 24 hours) at 11/18/12 1006 Last data filed at 11/18/12 0500  Gross per 24 hour  Intake 2371.58 ml  Output    900 ml  Net 1471.58 ml    Labs:   Recent Labs Lab 11/13/12 1709 11/17/12 1215 11/18/12 0550  NA 138 137 137  K 4.3 4.6 4.5  CL 106 104 105  CO2 25 25 28   BUN 7 4* 4*  CREATININE 0.92 0.96 1.08  CALCIUM 9.5  9.9 9.5  GLUCOSE 117* 101* 117*    Scheduled Meds: . [START ON 11/19/2012] feeding supplement  30 mL Per Tube BID  . pantoprazole (PROTONIX) IV  40 mg Intravenous Q24H    Continuous Infusions: . dextrose 5 % and 0.9 % NaCl with KCl 20 mEq/L 125 mL/hr at 11/18/12 0950  . feeding supplement (OSMOLITE 1.2 CAL)      Maureen Chatters, RD, LDN Pager #: [phone] After-Hours Pager #: [phone]

## 2012-11-18 NOTE — Progress Notes (Signed)
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  IR for G tube placement 7/15.  The mass is obstructing his distal esophagus . Path back as squamous cell- oncology called- Dr. Karel Jarvis- appreciate recommendations  MRI of liver ordered: Again demonstrated are an infiltrative mass of the distal  esophagus with enlarged distal para esophageal and celiac nodes  worrisome for nodal metastases.  2. The previously demonstrated lesion in the posterior segment of  the right hepatic lobe has MR features which are at least  moderately suspicious for a metastasis. Consider tissue sampling.  This may be difficult to biopsy percutaneously given its position  in the dome of the right hepatic lobe. Therefore, PET CT might  prove helpful.  3. Right lower lobe pulmonary nodule as seen on CT, also  suspicious for a possible metastasis.  CT scan of lung/abd/pelvis: 1. Bulky annular soft tissue mass involving the distal thoracic  esophagus, consistent with known primary esophageal carcinoma.  2. Metastatic lymphadenopathy in the posterior mediastinum and  right paratracheal region. Mild right hilar lymphadenopathy,  possibly also secondary to metastatic disease.  3. 6 mm noncalcified right lower lobe pulmonary nodule. Pulmonary  metastasis cannot be excluded. Consider PET CT scan for further  Evaluation  1. Metastatic upper abdominal lymphadenopathy in the gastrohepatic  ligament.  2. 1.5 cm hypovascular lesion in the posterior segment of the  right hepatic lobe, suspicious for liver metastasis. Consider PET  CT scan or abdomen MRI without and with contrast for further  evaluation.   Protein calorie malnutrition -G tube 7/15 and then feeds started 7/16 with 10 ml advancement every 12 hours- check Mg, Phos, K daily x 3 days -  IVF until tube feeds  Pain -PRN morphine -when tube working, start PO medications   Code Status: full Family Communication: spoke with patient/family- another meeting at 2 PM with KIM Disposition Plan: needs close follow up with oncology- outpatient PET scan, placement of PORT once plan in place -will need home health  Consultants:  GI  IR  Oncology  Procedures:  G tube per IR  EGD  Antibiotics:    HPI/Subjective: via translator C/o epigastric pain No SOB No fevers  Objective: Filed Vitals:   11/17/12 1615 11/17/12 1636 11/17/12 2128 11/18/12 0417  BP: 118/71 120/73 131/67 143/76  Pulse: 88 85 81 78  Temp:  97.8 F (36.6 C) 98.7 F (37.1 C) 98.7 F (37.1 C)  TempSrc:  Oral Oral Oral  Resp: 16 20 17 16   Height:      Weight:      SpO2: 99% 100% 99% 100%    Intake/Output Summary (Last 24 hours) at 11/18/12 0956 Last data filed at 11/18/12 0500  Gross per 24 hour  Intake 2371.58 ml  Output    900 ml  Net 1471.58 ml   Filed Weights   11/13/12 0306 11/13/12 1452  Weight: 55.339 kg (122 lb) 58.196 kg (128 lb 4.8 oz)    Exam:   General:  Pleasant/cooperative  Cardiovascular: rrr  Respiratory: clear anterior  Abdomen: +BS, soft, min tenderness  Musculoskeletal: moves all 4 ext  Data Reviewed: Basic Metabolic Panel:  Recent Labs Lab 11/13/12 0123 11/13/12 1709 11/17/12 1215 11/18/12 0550  NA 139 138 137 137  K 4.4 4.3 4.6 4.5  CL 101 106 104 105  CO2  --  25 25 28   GLUCOSE  100* 117* 101* 117*  BUN 9 7 4* 4*  CREATININE 1.10 0.92 0.96 1.08  CALCIUM  --  9.5 9.9 9.5   Liver Function Tests: No results found for this basename: AST, ALT, ALKPHOS, BILITOT, PROT, ALBUMIN,  in the last 168 hours  Recent Labs Lab 11/15/12 1547  LIPASE 16   No results found for this basename: AMMONIA,  in the last 168 hours CBC:  Recent Labs Lab 11/13/12 0107 11/13/12 0123 11/13/12 1709 11/18/12 0550  WBC 7.9  --  6.3 5.8  HGB 12.5* 13.6  11.3* 11.8*  HCT 37.2* 40.0 34.3* 35.0*  MCV 83.0  --  83.5 81.6  PLT 209  --  212 207   Cardiac Enzymes: No results found for this basename: CKTOTAL, CKMB, CKMBINDEX, TROPONINI,  in the last 168 hours BNP (last 3 results) No results found for this basename: PROBNP,  in the last 8760 hours CBG: No results found for this basename: GLUCAP,  in the last 168 hours  No results found for this or any previous visit (from the past 240 hour(s)).   Studies: Ir Gastrostomy Tube Mod Sed  11/17/2012   *RADIOLOGY REPORT*  Indication:  Esophageal mass, unable to place gastrostomy tube via endoscopic approach  "PUSH" PERCUTANEOUS GASTOSTOMY TUBE PLACEMENT  Comparison: CT abdomen pelvis - 11/13/2012; abdominal MRI - earlier same day  Medications:  Versed 3 mg IV; Fentanyl 100 mcg IV; Glucagon 1 gm IV; Ancef 2 grams IV; Antibiotics were administered within 1 hour of the procedure.  Contrast volume:  25 mL Omnipaque-300 administered into the gastric lumen  Sedation time: 45 minutes  Fluoroscopy time: 9 minutes, 6 seconds  Complications: None immediate  PROCEDURE/FINDINGS:  Informed written consent was obtained from the patient and the patient's daughter via the use of a Medical translator following explanation of the procedure, risks, benefits and alternatives.  A time out was performed prior to the initiation of the procedure. Ultrasound scanning was performed to demarcate the edge of the left lobe of the liver.  Maximal barrier sterile technique utilized including caps, mask, sterile gowns, sterile gloves, large sterile drape, hand hygiene and Betadine prep.  The left upper quadrant was sterilely prepped and draped.  An oral gastric catheter was inserted into the stomach under fluoroscopy. The left costal margin and air opacified transverse colon were identified and avoided.  Air was injected into the stomach for insufflation and visualization under fluoroscopy.  Under sterile conditions 3 T-tacts were deployed to  pexy the stomach to the anterior abdominal wall percutaneously beneath the left subcostal margin after the overlying soft tissues were anesthetized with 1% Lidocaine with epinephrine.  Each needle position was confirmed within the stomach with aspiration of air and injection of small amount of contrast.  Multiple spot fluoroscopic images were obtained to confirm appropriate intraluminal positioning.  An incision was made between the T tacks and an 18 gauge needle was utilized to access the stomach.  Again, a small amount of air was aspirated and a small amount of contrast was injected confirming appropriate intraluminal positioning.  Over an Amplatz guide wire, the access needle was exchanged for a Kumpe catheter.   With the use of a stiff Glidewire, the Kumpe catheter was manipulated into the descending portion of the duodenum.  Contrast injection confirmed appropriate positioning.  Under intermittent fluoroscopic guidance, Kumpe catheter was exchanged for the telescoping peel away sheath.  This ultimately allowed placement of e 20-French balloon retention gastrostomy tube.   The balloon was  insufflated within the gastric lumen and pulled taut against the ventral wall of the stomach.  Contrast injection confirms appropriate positioning within the stomach. Several spot radiographic images were obtained in various obliquities for documentation.  The patient tolerated procedure well without immediate post procedural complication.  IMPRESSION:  Successful fluoroscopic insertion of an 18-French "push" gastrostomy.   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 Liver lesion on CT.  MRI ABDOMEN WITHOUT AND WITH CONTRAST  Technique:  Multiplanar multisequence MR imaging of the abdomen was performed both before and after the administration of intravenous contrast.   Contrast: 12mL MULTIHANCE GADOBENATE DIMEGLUMINE 529 MG/ML IV SOLN  Comparison:  CTs of the chest, abdomen and pelvis 11/13/2012.  Findings:  Study is mildly motion degraded, especially on the postcontrast images.  The previously demonstrated lesion within the posterior dome of the right hepatic lobe measures 1.4 cm in diameter.  This lesion demonstrates homogeneous low T1 and heterogeneous T2 signal.  There is associated restricted diffusion. Postcontrast, this lesion demonstrates peripheral enhancement which appears continuous.  This is at least moderately suspicious for a metastasis.  No other liver lesions are identified.  The previously demonstrated infiltrative mass involving the distal esophagus appears grossly unchanged.  Adjacent distal para esophageal and celiac lymph nodes are unchanged, worrisome for metastatic disease.  There is a right lower lobe pulmonary nodule which was seen on the CT.  The spleen, adrenal glands, gallbladder, pancreas and kidneys appear normal.  There are no worrisome osseous findings.  IMPRESSION:  1.  Again demonstrated are an infiltrative mass of the distal esophagus with enlarged distal para esophageal and celiac nodes worrisome for nodal metastases. 2.  The previously demonstrated lesion in the posterior segment of the right hepatic lobe has MR features which are at least moderately suspicious for a metastasis.  Consider tissue sampling. This may be difficult to biopsy percutaneously given its position in the dome of the right hepatic lobe.  Therefore, PET CT might prove helpful. 3.  Right lower lobe pulmonary nodule as seen on CT, also suspicious for a possible metastasis.   Original Report Authenticated By: Carey Bullocks, M.D.    Scheduled Meds: . [START ON 11/19/2012] feeding supplement  30 mL Per Tube BID  . pantoprazole (PROTONIX) IV  40 mg Intravenous Q24H   Continuous Infusions: . dextrose 5 % and 0.9 % NaCl with KCl 20 mEq/L 125 mL/hr at 11/18/12 0950  .  feeding supplement (OSMOLITE 1.2 CAL)      Active Problems:   Dysphagia   Esophageal cancer   Protein-calorie malnutrition, severe    Time spent: 35    [facility], Judeth Gilles  [facility] Pager [phone]. If 7PM-7AM, please contact night-coverage at [email or web address], password [facility] 11/18/2012, 9:56 AM  LOS: 5 days

## 2012-11-19 ENCOUNTER — Encounter (HOSPITAL_COMMUNITY): Payer: Self-pay | Admitting: Radiology

## 2012-11-19 LAB — BASIC METABOLIC PANEL
GFR calc Af Amer: 89 mL/min — ABNORMAL LOW (ref >90)
GFR calc non Af Amer: 77 mL/min — ABNORMAL LOW (ref >90)
Glucose, Bld: 116 mg/dL — ABNORMAL HIGH (ref 70–99)
Potassium: 4.1 mEq/L (ref 3.5–5.1)
Sodium: 137 mEq/L (ref 135–145)

## 2012-11-19 LAB — GLUCOSE, CAPILLARY
Glucose-Capillary: 111 mg/dL — ABNORMAL HIGH (ref 70–99)
Glucose-Capillary: 94 mg/dL (ref 70–99)

## 2012-11-19 LAB — MAGNESIUM: Magnesium: 1.8 mg/dL (ref 1.5–2.5)

## 2012-11-19 MED ORDER — CEFAZOLIN SODIUM-DEXTROSE 2-3 GM-% IV SOLR
2.0000 g | INTRAVENOUS | Status: AC
  Administered 2012-11-20: 2 g via INTRAVENOUS
  Filled 2012-11-19 (×2): qty 50

## 2012-11-19 NOTE — Progress Notes (Addendum)
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 Path back as squamous cell carcinoma -  Oncology was consulted and per Dr. Karel Jarvis recommendations on 11/16/12-Need nutritional support and J-tube placement, Bronch to r/o fistula,  IV access (port placement).  - F/u in the onc clinic once he is D/ced for final plan but if locally advanced, will plan for concurrent chemo-rad first then evaluation for surgical resection. If metastatic diease, he will be receiving systemic chemotherapy. - d/w Dr Karel Jarvis today recommended inpatient port placement, bronch if patient doesn't have insurance. I will check with case manager  - MRI of liver ordered: Again demonstrated are an infiltrative mass of the distal esophagus with enlarged distal para esophageal and celiac nodes worrisome for nodal metastases. Recommended PET/CT for liver metastasis . Right lung nodule likely metastasis. -IR was consulted and patient underwent G-tube placement for nutritional support. Tube feed started, currently at 30 cc an hour goal of 60 cc an hour,    Protein calorie malnutrition  - G tube 7/15 and then feeds started 7/16, check Mg, Phos, K daily x 3 days  - IVF until tube feeds at goal  Pain  uncontrolled  -PRN morphine   DVT Prophylaxis:  Code Status:  Disposition:    Subjective: Tolerating tube feeds so far, complaining of pain in the abdomen  Objective: Weight change:   Intake/Output Summary (Last 24 hours) at 11/19/12 1321 Last data filed at 11/19/12 0854  Gross per 24 hour  Intake   3562 ml  Output   1450 ml  Net   2112 ml   Blood pressure 135/70, pulse 78, temperature 98.2 F (36.8 C), temperature source  Oral, resp. rate 18, height 5\' 1"  (1.549 m), weight 58.196 kg (128 lb 4.8 oz), SpO2 100.00%.  Physical Exam: General: Alert and awake, oriented x3, not in any acute distress. CVS: S1-S2 clear, no murmur rubs or gallops Chest: clear to auscultation bilaterally, no wheezing, rales or rhonchi Abdomen: soft nontender, nondistended, normal bowel sounds G-tube Extremities: no cyanosis, clubbing or edema noted bilaterally   Lab Results: Basic Metabolic Panel:  Recent Labs Lab 11/18/12 0550 11/19/12 0455  NA 137 137  K 4.5 4.1  CL 105 106  CO2 28 27  GLUCOSE 117* 116*  BUN 4* 4*  CREATININE 1.08 0.93  CALCIUM 9.5 9.6  MG  --  1.8  PHOS  --  3.0   Liver Function Tests: No results found for this basename: AST, ALT, ALKPHOS, BILITOT, PROT, ALBUMIN,  in the last 168 hours  Recent Labs Lab 11/15/12 1547  LIPASE 16   No results found for this basename: AMMONIA,  in the last 168 hours CBC:  Recent Labs Lab 11/13/12 1709 11/18/12 0550  WBC 6.3 5.8  HGB 11.3* 11.8*  HCT 34.3* 35.0*  MCV 83.5 81.6  PLT 212 207   Cardiac Enzymes: No results found for this basename: CKTOTAL, CKMB, CKMBINDEX, TROPONINI,  in the last 168 hours BNP: No components found with this basename: POCBNP,  CBG:  Recent Labs Lab 11/18/12 2015 11/19/12 0009 11/19/12 0428 11/19/12 0815 11/19/12 1229  GLUCAP 103* 118* 116* 106* 111*     Micro Results: No results found for  this or any previous visit (from the past 240 hour(s)).  Studies/Results: Ct Chest W Contrast  11/13/2012   *RADIOLOGY REPORT*  Clinical Data:  Esophageal carcinoma.  CT CHEST, ABDOMEN AND PELVIS WITH CONTRAST  Technique:  Multidetector CT imaging of the chest, abdomen and pelvis was performed following the standard protocol during bolus administration of intravenous contrast.  Contrast: OMNIPAQUE IOHEXOL 300 MG/ML  SOLN  Comparison:   None.  CT CHEST  Findings:  A bulky annular soft tissue mass is seen involving the distal  thoracic esophagus which measures approximately 3.2 x 4.0 x 5.9 cm.  Para esophageal lymphadenopathy is seen within the posterior mediastinum both anterior and posterior to the descending thoracic aorta.  Largest lymph node measures 1.5 cm on image 33. Mediastinal lymphadenopathy also seen in the right paratracheal region measuring 2.0 x 2.8 cm on image 10. A 1.3 cm right hilar lymph node is seen with a small central focus of calcification. This could represent metastatic disease or partially calcified granulomatous disease.  No evidence of pleural or pericardial effusion. A 6 mm noncalcified pulmonary nodule is seen in the right lower lobe on image 29, which raises suspicion for pulmonary metastasis.  No other suspicious pulmonary nodules or masses are identified.  IMPRESSION:  1.  Bulky annular soft tissue mass involving the distal thoracic esophagus, consistent with known primary esophageal carcinoma. 2.  Metastatic lymphadenopathy in the posterior mediastinum and right paratracheal region.  Mild right hilar lymphadenopathy, possibly also secondary to metastatic disease. 3.  6 mm noncalcified right lower lobe pulmonary nodule.  Pulmonary metastasis cannot be excluded.  Consider PET CT scan for further evaluation  CT ABDOMEN AND PELVIS  Findings:  Upper abdominal lymphadenopathy is seen gastrohepatic ligament measuring 2.9 x 3.8 cm on image 47.  No other pathologically enlarged nodes are seen within the abdomen or pelvis.  A hypovascular lesion is seen in the posterior segment right hepatic lobe measuring 1.5 cm on image 37.  This is suspicious for a liver metastasis.  No other liver lesions are identified.  The gallbladder, pancreas, spleen, adrenal glands, kidneys are normal in appearance.  No evidence of hydronephrosis.  No pelvic masses or lymphadenopathy identified.  No evidence of inflammatory process or abnormal fluid collections.  No suspicious bone lesions identified.  IMPRESSION:  1.  Metastatic upper  abdominal lymphadenopathy in the gastrohepatic ligament. 2.  1.5 cm hypovascular lesion in the posterior segment of the right hepatic lobe, suspicious for liver metastasis.  Consider PET CT scan or abdomen MRI without and with contrast for further evaluation.   Original Report Authenticated By: Myles Rosenthal, M.D.   Ct Abdomen Pelvis W Contrast  11/13/2012   *RADIOLOGY REPORT*  Clinical Data:  Esophageal carcinoma.  CT CHEST, ABDOMEN AND PELVIS WITH CONTRAST  Technique:  Multidetector CT imaging of the chest, abdomen and pelvis was performed following the standard protocol during bolus administration of intravenous contrast.  Contrast: OMNIPAQUE IOHEXOL 300 MG/ML  SOLN  Comparison:   None.  CT CHEST  Findings:  A bulky annular soft tissue mass is seen involving the distal thoracic esophagus which measures approximately 3.2 x 4.0 x 5.9 cm.  Para esophageal lymphadenopathy is seen within the posterior mediastinum both anterior and posterior to the descending thoracic aorta.  Largest lymph node measures 1.5 cm on image 33. Mediastinal lymphadenopathy also seen in the right paratracheal region measuring 2.0 x 2.8 cm on image 10. A 1.3 cm right hilar lymph node is seen with a  small central focus of calcification. This could represent metastatic disease or partially calcified granulomatous disease.  No evidence of pleural or pericardial effusion. A 6 mm noncalcified pulmonary nodule is seen in the right lower lobe on image 29, which raises suspicion for pulmonary metastasis.  No other suspicious pulmonary nodules or masses are identified.  IMPRESSION:  1.  Bulky annular soft tissue mass involving the distal thoracic esophagus, consistent with known primary esophageal carcinoma. 2.  Metastatic lymphadenopathy in the posterior mediastinum and right paratracheal region.  Mild right hilar lymphadenopathy, possibly also secondary to metastatic disease. 3.  6 mm noncalcified right lower lobe pulmonary nodule.  Pulmonary  metastasis cannot be excluded.  Consider PET CT scan for further evaluation  CT ABDOMEN AND PELVIS  Findings:  Upper abdominal lymphadenopathy is seen gastrohepatic ligament measuring 2.9 x 3.8 cm on image 47.  No other pathologically enlarged nodes are seen within the abdomen or pelvis.  A hypovascular lesion is seen in the posterior segment right hepatic lobe measuring 1.5 cm on image 37.  This is suspicious for a liver metastasis.  No other liver lesions are identified.  The gallbladder, pancreas, spleen, adrenal glands, kidneys are normal in appearance.  No evidence of hydronephrosis.  No pelvic masses or lymphadenopathy identified.  No evidence of inflammatory process or abnormal fluid collections.  No suspicious bone lesions identified.  IMPRESSION:  1.  Metastatic upper abdominal lymphadenopathy in the gastrohepatic ligament. 2.  1.5 cm hypovascular lesion in the posterior segment of the right hepatic lobe, suspicious for liver metastasis.  Consider PET CT scan or abdomen MRI without and with contrast for further evaluation.   Original Report Authenticated By: Myles Rosenthal, M.D.   Ir Gastrostomy Tube Mod Sed  11/17/2012   *RADIOLOGY REPORT*  Indication:  Esophageal mass, unable to place gastrostomy tube via endoscopic approach  "PUSH" PERCUTANEOUS GASTOSTOMY TUBE PLACEMENT  Comparison: CT abdomen pelvis - 11/13/2012; abdominal MRI - earlier same day  Medications:  Versed 3 mg IV; Fentanyl 100 mcg IV; Glucagon 1 gm IV; Ancef 2 grams IV; Antibiotics were administered within 1 hour of the procedure.  Contrast volume:  25 mL Omnipaque-300 administered into the gastric lumen  Sedation time: 45 minutes  Fluoroscopy time: 9 minutes, 6 seconds  Complications: None immediate  PROCEDURE/FINDINGS:  Informed written consent was obtained from the patient and the patient's daughter via the use of a Medical translator following explanation of the procedure, risks, benefits and alternatives.  A time out was performed prior  to the initiation of the procedure. Ultrasound scanning was performed to demarcate the edge of the left lobe of the liver.  Maximal barrier sterile technique utilized including caps, mask, sterile gowns, sterile gloves, large sterile drape, hand hygiene and Betadine prep.  The left upper quadrant was sterilely prepped and draped.  An oral gastric catheter was inserted into the stomach under fluoroscopy. The left costal margin and air opacified transverse colon were identified and avoided.  Air was injected into the stomach for insufflation and visualization under fluoroscopy.  Under sterile conditions 3 T-tacts were deployed to pexy the stomach to the anterior abdominal wall percutaneously beneath the left subcostal margin after the overlying soft tissues were anesthetized with 1% Lidocaine with epinephrine.  Each needle position was confirmed within the stomach with aspiration of air and injection of small amount of contrast.  Multiple spot fluoroscopic images were obtained to confirm appropriate intraluminal positioning.  An incision was made between the T tacks and an 18 gauge  needle was utilized to access the stomach.  Again, a small amount of air was aspirated and a small amount of contrast was injected confirming appropriate intraluminal positioning.  Over an Amplatz guide wire, the access needle was exchanged for a Kumpe catheter.   With the use of a stiff Glidewire, the Kumpe catheter was manipulated into the descending portion of the duodenum.  Contrast injection confirmed appropriate positioning.  Under intermittent fluoroscopic guidance, Kumpe catheter was exchanged for the telescoping peel away sheath.  This ultimately allowed placement of e 20-French balloon retention gastrostomy tube.   The balloon was insufflated within the gastric lumen and pulled taut against the ventral wall of the stomach.  Contrast injection confirms appropriate positioning within the stomach. Several spot radiographic images  were obtained in various obliquities for documentation.  The patient tolerated procedure well without immediate post procedural complication.  IMPRESSION:  Successful fluoroscopic insertion of an 18-French "push" gastrostomy.   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 Liver lesion on CT.  MRI ABDOMEN WITHOUT AND WITH CONTRAST  Technique:  Multiplanar multisequence MR imaging of the abdomen was performed both before and after the administration of intravenous contrast.  Contrast: 12mL MULTIHANCE GADOBENATE DIMEGLUMINE 529 MG/ML IV SOLN  Comparison:  CTs of the chest, abdomen and pelvis 11/13/2012.  Findings:  Study is mildly motion degraded, especially on the postcontrast images.  The previously demonstrated lesion within the posterior dome of the right hepatic lobe measures 1.4 cm in diameter.  This lesion demonstrates homogeneous low T1 and heterogeneous T2 signal.  There is associated restricted diffusion. Postcontrast, this lesion demonstrates peripheral enhancement which appears continuous.  This is at least moderately suspicious for a metastasis.  No other liver lesions are identified.  The previously demonstrated infiltrative mass involving the distal esophagus appears grossly unchanged.  Adjacent distal para esophageal and celiac lymph nodes are unchanged, worrisome for metastatic disease.  There is a right lower lobe pulmonary nodule which was seen on the CT.  The spleen, adrenal glands, gallbladder, pancreas and kidneys appear normal.  There are no worrisome osseous findings.  IMPRESSION:  1.  Again demonstrated are an infiltrative mass of the distal esophagus with enlarged distal para esophageal and celiac nodes worrisome for nodal metastases. 2.  The previously demonstrated lesion in the posterior segment of the right hepatic lobe has MR  features which are at least moderately suspicious for a metastasis.  Consider tissue sampling. This may be difficult to biopsy percutaneously given its position in the dome of the right hepatic lobe.  Therefore, PET CT might prove helpful. 3.  Right lower lobe pulmonary nodule as seen on CT, also suspicious for a possible metastasis.   Original Report Authenticated By: Carey Bullocks, M.D.    Medications: Scheduled Meds: . feeding supplement  30 mL Per Tube BID  . pantoprazole (PROTONIX) IV  40 mg Intravenous Q24H      LOS: 6 days   Aemon Koeller M.D. [facility] 11/19/2012, 1:21 PM Pager: [phone]  If 7PM-7AM, please contact night-coverage [email or web address] Password [email or web address]

## 2012-11-19 NOTE — Consult Note (Signed)
PULMONARY  / CRITICAL CARE MEDICINE  Name: Douglas Jordan MRN: [id number] DOB: [birth year]    ADMISSION DATE:  11/13/2012 CONSULTATION DATE:  11/18/2012  REFERRING MD :  Isidoro Donning PRIMARY SERVICE: PCCM  CHIEF COMPLAINT:  Esophageal mass  BRIEF PATIENT DESCRIPTION: 79 y/o male with newly diagnosed squamous cell carcinoma of the esophagus.  PCCM consulted to rule out TE fistula.  SIGNIFICANT EVENTS / STUDIES:  7/16 CT chest/AB/pelvis>bulky soft tissue mass in distal thoracic esophagus; mediastinal lympadenopathy, pulm nodule, metastatic upper lymphadenopathy,   LINES / TUBES: 7/15 IR guided gastrostomy tube  CULTURES:   ANTIBIOTICS:   HISTORY OF PRESENT ILLNESS:  79 y/o male with newly diagnosed squamous cell carcinoma of the esophagus.  PCCM consulted to rule out TE fistula.  His daughter provides the history by phone because he does not speak [place].  She states that he had pain and trouble swallowing for several weeks prior to admission. He was found to have an esophageal mass which on EGD biopsy showed squamous cell carcinoma.  Oncology would like for the patient to have a bronchoscopy to look for TE fistula.  The patient can swallow liquids, but it is painful.  He does not have vomiting or regurgiation or coughing with fistula.  He rarely coughs at all, according to his daughter.   PAST MEDICAL HISTORY :  Past Medical History  Diagnosis Date  . GERD (gastroesophageal reflux disease) No knownn hx but, pt reports symptoms of mid sternal pain at xiphoid that is irriated when eating.  . Esophageal cancer 11/13/2012   Past Surgical History  Procedure Laterality Date  . Esophagogastroduodenoscopy (egd) with esophageal dilation N/A 11/13/2012    Procedure: ESOPHAGOGASTRODUODENOSCOPY (EGD) WITH ESOPHAGEAL DILATION;  Surgeon: Rachael Fee, MD;  Location: [facility] ENDOSCOPY;  Service: Endoscopy;  Laterality: N/A;  . Savory dilation N/A 11/13/2012    Procedure: SAVORY DILATION;  Surgeon: Rachael Fee,  MD;  Location: [facility] ENDOSCOPY;  Service: Endoscopy;  Laterality: N/A;   Prior to Admission medications   Medication Sig Start Date End Date Taking? Authorizing Provider  OVER THE COUNTER MEDICATION Take 1 tablet by mouth once. OTC med for ulcer   Yes Historical Provider, MD  Probiotic Product (PROBIOTIC DAILY PO) Take 1 tablet by mouth once.   Yes Historical Provider, MD   No Known Allergies  FAMILY HISTORY:  No family history on file. SOCIAL HISTORY:  reports that he has never smoked. He does not have any smokeless tobacco history on file. He reports that he does not drink alcohol or use illicit drugs.  REVIEW OF SYSTEMS:  Cannot obtain due to language barrier  SUBJECTIVE:   VITAL SIGNS: Temp:  [98.2 F (36.8 C)-99 F (37.2 C)] 99 F (37.2 C) (07/17 1400) Pulse Rate:  [71-78] 77 (07/17 1400) Resp:  [18-20] 20 (07/17 1400) BP: (125-135)/(68-71) 125/68 mmHg (07/17 1400) SpO2:  [100 %] 100 % (07/17 1400) HEMODYNAMICS:   VENTILATOR SETTINGS:   INTAKE / OUTPUT: Intake/Output     07/17 0701 - 07/18 0700   I.V. (mL/kg) 875 (15)   NG/GT 280   Total Intake(mL/kg) 1155 (19.8)   Urine (mL/kg/hr) 1800 (2.1)   Total Output 1800   Net -645         PHYSICAL EXAMINATION:  Gen: well appearing, no acute distress HEENT: NCAT, PERRL, EOMi, OP clear, neck supple without masses PULM: CTA B CV: RRR, no mgr, no JVD AB: BS+, soft, nontender, no hsm Ext: warm, no edema, no clubbing, no cyanosis Derm: no  rash or skin breakdown Neuro: Awake and alert, interactive, CN II-XII intact, MAEW   LABS:  Recent Labs Lab 11/13/12 0107  11/13/12 0123  11/13/12 1709 11/14/12 1100 11/17/12 1215 11/18/12 0550 11/19/12 0455  HGB 12.5*  --  13.6  --  11.3*  --   --  11.8*  --   WBC 7.9  --   --   --  6.3  --   --  5.8  --   PLT 209  --   --   --  212  --   --  207  --   NA  --   < > 139  --  138  --  137 137 137  K  --   < > 4.4  --  4.3  --  4.6 4.5 4.1  CL  --   < > 101  --  106  --  104  105 106  CO2  --   --   --   < > 25  --  25 28 27   GLUCOSE  --   < > 100*  --  117*  --  101* 117* 116*  BUN  --   < > 9  --  7  --  4* 4* 4*  CREATININE  --   < > 1.10  --  0.92  --  0.96 1.08 0.93  CALCIUM  --   --   --   < > 9.5  --  9.9 9.5 9.6  MG  --   --   --   --   --   --   --   --  1.8  PHOS  --   --   --   --   --   --   --   --  3.0  APTT  --   --   --   --   --  40*  --   --   --   INR  --   --   --   --   --  1.12  --   --   --   < > = values in this interval not displayed.  Recent Labs Lab 11/19/12 0428 11/19/12 0815 11/19/12 1229 11/19/12 1632 11/19/12 2005  GLUCAP 116* 106* 111* 94 110*    ASSESSMENT / PLAN:   A:  Locally advansed squamous cell carcinoma (possibly metastatic) of the esophagus causing partial esophageal obstruction and dysphagia;    We have been asked to evaluate for tracheo-esophageal fistula.  Given lack of cough my index of suspicion of this is low.  He can still swallow liquids, so a barium swallow is the best first test to look for a TE fistula.  Some retrospective pediatric case reports show a higher incidence of diagnosis of TE fistula with bronchoscopy compared to barium swallow, but it's not clear to me that these cases are relevant to Douglas Jordan situation.  P:   -barium swallow -if negative, discuss index of suspicion of TE fistula with oncology and consider bronchoscopy  Dis Esophagus. 2013 May-Jun;26(4):380-1 J Pediatr Surg. 2013 Jun;48(6):E13-7. doi: [email or web address]  Fonnie Jarvis Pulmonary and Critical Care Medicine [facility] Pager: [phone]  11/19/2012, 9:24 PM

## 2012-11-19 NOTE — H&P (Signed)
Referring Physician: Dr. Isidoro Donning HPI: Douglas Jordan is an 79 y.o. male with PMHx of esophageal cancer. Dr. Isidoro Donning has spoke with Dr. Karel Jarvis the oncologist who would like to proceed with a port-a-cath placement during this admission for future chemotherapy.    Past Medical History:  Past Medical History  Diagnosis Date  . GERD (gastroesophageal reflux disease) No knownn hx but, pt reports symptoms of mid sternal pain at xiphoid that is irriated when eating.  . Esophageal cancer 11/13/2012    Past Surgical History:  Past Surgical History  Procedure Laterality Date  . Esophagogastroduodenoscopy (egd) with esophageal dilation N/A 11/13/2012    Procedure: ESOPHAGOGASTRODUODENOSCOPY (EGD) WITH ESOPHAGEAL DILATION;  Surgeon: Rachael Fee, MD;  Location: [facility] ENDOSCOPY;  Service: Endoscopy;  Laterality: N/A;  . Savory dilation N/A 11/13/2012    Procedure: SAVORY DILATION;  Surgeon: Rachael Fee, MD;  Location: [facility] ENDOSCOPY;  Service: Endoscopy;  Laterality: N/A;    Family History: No family history on file.  Social History:  reports that he has never smoked. He does not have any smokeless tobacco history on file. He reports that he does not drink alcohol or use illicit drugs.  Allergies: No Known Allergies    Medication List    ASK your doctor about these medications       OVER THE COUNTER MEDICATION  Take 1 tablet by mouth once. OTC med for ulcer     PROBIOTIC DAILY PO  Take 1 tablet by mouth once.        Please HPI for pertinent positives, otherwise complete 10 system ROS negative.  Physical Exam: BP 125/68  Pulse 77  Temp(Src) 99 F (37.2 C) (Oral)  Resp 20  Ht 5\' 1"  (1.549 m)  Wt 128 lb 4.8 oz (58.196 kg)  BMI 24.25 kg/m2  SpO2 100% Body mass index is 24.25 kg/(m^2).   General Appearance:  Alert, cooperative, no distress, appears stated age  Head:  Normocephalic, without obvious abnormality, atraumatic  ENT: Unremarkable  Neck: Supple, symmetrical, trachea midline  Lungs:    Clear to auscultation bilaterally, no w/r/r, respirations unlabored without use of accessory muscles.  Chest Wall:  No tenderness or deformity  Heart:  Regular rate and rhythm, S1, S2 normal, no murmur, rub or gallop.  Abdomen:   G-tube intact. Soft, tender, non distended.  Extremities: Extremities normal, atraumatic, no cyanosis or edema  Pulses: 2+ and symmetric  Neurologic: Normal affect, no gross deficits.   Results for orders placed during the hospital encounter of 11/13/12 (from the past 48 hour(s))  CBC     Status: Abnormal   Collection Time    11/18/12  5:50 AM      Result Value Range   WBC 5.8  4.0 - 10.5 K/uL   RBC 4.29  4.22 - 5.81 MIL/uL   Hemoglobin 11.8 (*) 13.0 - 17.0 g/dL   HCT 16.1 (*) 09.6 - 04.5 %   MCV 81.6  78.0 - 100.0 fL   MCH 27.5  26.0 - 34.0 pg   MCHC 33.7  30.0 - 36.0 g/dL   RDW 40.9  81.1 - 91.4 %   Platelets 207  150 - 400 K/uL  BASIC METABOLIC PANEL     Status: Abnormal   Collection Time    11/18/12  5:50 AM      Result Value Range   Sodium 137  135 - 145 mEq/L   Potassium 4.5  3.5 - 5.1 mEq/L   Chloride 105  96 - 112 mEq/L  CO2 28  19 - 32 mEq/L   Glucose, Bld 117 (*) 70 - 99 mg/dL   BUN 4 (*) 6 - 23 mg/dL   Creatinine, Ser 1.61  0.50 - 1.35 mg/dL   Calcium 9.5  8.4 - 09.6 mg/dL   GFR calc non Af Amer 62 (*) >90 mL/min   GFR calc Af Amer 72 (*) >90 mL/min   Comment:            The eGFR has been calculated     using the CKD EPI equation.     This calculation has not been     validated in all clinical     situations.     eGFR's persistently     <90 mL/min signify     possible Chronic Kidney Disease.  GLUCOSE, CAPILLARY     Status: Abnormal   Collection Time    11/18/12  8:15 PM      Result Value Range   Glucose-Capillary 103 (*) 70 - 99 mg/dL  GLUCOSE, CAPILLARY     Status: Abnormal   Collection Time    11/19/12 12:09 AM      Result Value Range   Glucose-Capillary 118 (*) 70 - 99 mg/dL  GLUCOSE, CAPILLARY     Status: Abnormal    Collection Time    11/19/12  4:28 AM      Result Value Range   Glucose-Capillary 116 (*) 70 - 99 mg/dL  MAGNESIUM     Status: None   Collection Time    11/19/12  4:55 AM      Result Value Range   Magnesium 1.8  1.5 - 2.5 mg/dL  PHOSPHORUS     Status: None   Collection Time    11/19/12  4:55 AM      Result Value Range   Phosphorus 3.0  2.3 - 4.6 mg/dL  BASIC METABOLIC PANEL     Status: Abnormal   Collection Time    11/19/12  4:55 AM      Result Value Range   Sodium 137  135 - 145 mEq/L   Potassium 4.1  3.5 - 5.1 mEq/L   Chloride 106  96 - 112 mEq/L   CO2 27  19 - 32 mEq/L   Glucose, Bld 116 (*) 70 - 99 mg/dL   BUN 4 (*) 6 - 23 mg/dL   Creatinine, Ser 0.45  0.50 - 1.35 mg/dL   Calcium 9.6  8.4 - 40.9 mg/dL   GFR calc non Af Amer 77 (*) >90 mL/min   GFR calc Af Amer 89 (*) >90 mL/min   Comment:            The eGFR has been calculated     using the CKD EPI equation.     This calculation has not been     validated in all clinical     situations.     eGFR's persistently     <90 mL/min signify     possible Chronic Kidney Disease.  GLUCOSE, CAPILLARY     Status: Abnormal   Collection Time    11/19/12  8:15 AM      Result Value Range   Glucose-Capillary 106 (*) 70 - 99 mg/dL  GLUCOSE, CAPILLARY     Status: Abnormal   Collection Time    11/19/12 12:29 PM      Result Value Range   Glucose-Capillary 111 (*) 70 - 99 mg/dL  GLUCOSE, CAPILLARY     Status: None   Collection  Time    11/19/12  4:32 PM      Result Value Range   Glucose-Capillary 94  70 - 99 mg/dL   No results found.  Assessment/Plan Esophageal cancer. Per Dr. Isidoro Donning and Dr. Karel Jarvis oncologist request, port-a-cath placement scheduled for 7/18 for chemotherapy use.  NPO after midnight, ancef ordered, labs ordered.  Patient WBC WNL (5.8) and afebrile.  Risks and Benefits discussed with the patient's daughter, Selena Batten over the phone and daughter has translated and spoke with the patient regarding the plan. All of the  patient's questions were answered, patient is agreeable to proceed. Consent signed by daughter and in chart.   Pattricia Boss D PA-C 11/19/2012, 5:08 PM

## 2012-11-20 ENCOUNTER — Inpatient Hospital Stay (HOSPITAL_COMMUNITY): Payer: MEDICAID

## 2012-11-20 ENCOUNTER — Telehealth: Payer: Self-pay | Admitting: Oncology

## 2012-11-20 ENCOUNTER — Telehealth: Payer: Self-pay

## 2012-11-20 DIAGNOSIS — R109 Unspecified abdominal pain: Secondary | ICD-10-CM

## 2012-11-20 LAB — GLUCOSE, CAPILLARY
Glucose-Capillary: 98 mg/dL (ref 70–99)
Glucose-Capillary: 111 mg/dL — ABNORMAL HIGH (ref 70–99)
Glucose-Capillary: 99 mg/dL (ref 70–99)

## 2012-11-20 LAB — BASIC METABOLIC PANEL
Calcium: 9.9 mg/dL (ref 8.4–10.5)
GFR calc Af Amer: 75 mL/min — ABNORMAL LOW (ref >90)
GFR calc non Af Amer: 64 mL/min — ABNORMAL LOW (ref >90)
Sodium: 137 mEq/L (ref 135–145)

## 2012-11-20 LAB — PROTIME-INR
INR: 1.11 (ref 0.00–1.49)
Prothrombin Time: 14.1 seconds (ref 11.6–15.2)

## 2012-11-20 LAB — PHOSPHORUS: Phosphorus: 3.1 mg/dL (ref 2.3–4.6)

## 2012-11-20 MED ORDER — CEFAZOLIN SODIUM-DEXTROSE 2-3 GM-% IV SOLR
2.0000 g | Freq: Once | INTRAVENOUS | Status: AC
  Administered 2012-11-20: 2 g via INTRAVENOUS
  Filled 2012-11-20: qty 50

## 2012-11-20 MED ORDER — MORPHINE SULFATE ER 30 MG PO TBCR
30.0000 mg | EXTENDED_RELEASE_TABLET | Freq: Two times a day (BID) | ORAL | Status: DC
  Administered 2012-11-20 – 2012-11-22 (×4): 30 mg via ORAL
  Filled 2012-11-20 (×4): qty 1

## 2012-11-20 MED ORDER — CEFAZOLIN SODIUM-DEXTROSE 2-3 GM-% IV SOLR
INTRAVENOUS | Status: AC
  Administered 2012-11-20: 2000 mg via INTRAVENOUS
  Filled 2012-11-20: qty 50

## 2012-11-20 MED ORDER — HYDROMORPHONE HCL PF 1 MG/ML IJ SOLN
1.0000 mg | INTRAMUSCULAR | Status: DC | PRN
  Administered 2012-11-21: 1 mg via INTRAVENOUS
  Filled 2012-11-20 (×2): qty 1

## 2012-11-20 MED ORDER — FENTANYL CITRATE 0.05 MG/ML IJ SOLN
INTRAMUSCULAR | Status: AC | PRN
  Administered 2012-11-20 (×3): 50 ug via INTRAVENOUS

## 2012-11-20 MED ORDER — MIDAZOLAM HCL 2 MG/2ML IJ SOLN
INTRAMUSCULAR | Status: AC | PRN
  Administered 2012-11-20 (×3): 1 mg via INTRAVENOUS

## 2012-11-20 NOTE — ED Notes (Signed)
Patient denies pain and is resting comfortably with eyes closed, asleep

## 2012-11-20 NOTE — Progress Notes (Signed)
NUTRITION FOLLOW UP  DOCUMENTATION CODES Per approved criteria  -Severe malnutrition in the context of acute illness or injury   Intervention:    Continue to increase Osmolite 1.2 formula 10 ml every 12 hours to goal rate of 60 ml/hr with Prostat liquid protein twice daily to provide 1928 total kcals, 110 gm protein, 1181 ml of free water  Once IVF D/C'd recommend free water flushes via G-tube at 200 ml 4 times daily RD to follow for nutrition care plan  Nutrition Dx:   Inadequate oral intake related to inability to eat, esophageal mass as evidenced by NPO status, ongoing  Goal:   EN to meet > 90% of estimated nutrition needs, progressing  Monitor:   EN regimen & tolerance, weight, labs, I/O's  Assessment:   Patient with PMH of GERD presented with dysphagia and odynophagia; has been feeling solid food get stuck about 3 months ago; evaluation in ER included a normal Hb of 13.6 ---> GI was consulted and hospitalist was asked to accept patient to [facility] for admission  EGD with biopsy completed 7/11. Impression was malignant esophageal mass obstructing acces to patient's stomach. G-tube placed 7/15.  Oncology following.  Osmolite 1.2 formula currently infusing at 40 ml/hr; RN reports tolerating well; to increase rate to 50 ml/hr upon patient's return from IR ---> PowerPort placed.  Mg, Phos, K+ WNL.  Height: Ht Readings from Last 1 Encounters:  11/13/12 5\' 1"  (1.549 m)    Weight Status:   Wt Readings from Last 1 Encounters:  11/13/12 128 lb 4.8 oz (58.196 kg)    Re-estimated needs:  Kcal: 1800-2000 Protein: 105-115 gm Fluid: 1.8-2.0 L  Skin: Intact  Diet Order: NPO   Intake/Output Summary (Last 24 hours) at 11/20/12 1156 Last data filed at 11/20/12 0012  Gross per 24 hour  Intake 2714.33 ml  Output   1600 ml  Net 1114.33 ml    Labs:   Recent Labs Lab 11/18/12 0550 11/19/12 0455 11/20/12 0545  NA 137 137 137  K 4.5 4.1 4.4  CL 105 106 103  CO2 28 27 29    BUN 4* 4* 9  CREATININE 1.08 0.93 1.05  CALCIUM 9.5 9.6 9.9  MG  --  1.8 2.0  PHOS  --  3.0 3.1  GLUCOSE 117* 116* 106*    CBG (last 3)   Recent Labs  11/20/12 0011 11/20/12 0432 11/20/12 0813  GLUCAP 91 104* 98    Scheduled Meds: .  ceFAZolin (ANCEF) IV  2 g Intravenous Once  . feeding supplement  30 mL Per Tube BID  . pantoprazole (PROTONIX) IV  40 mg Intravenous Q24H    Continuous Infusions: . dextrose 5 % and 0.9 % NaCl with KCl 20 mEq/L 75 mL/hr at 11/19/12 2049  . feeding supplement (OSMOLITE 1.2 CAL) 1,000 mL (11/19/12 2300)    Maureen Chatters, RD, LDN Pager #: [phone] After-Hours Pager #: [phone]

## 2012-11-20 NOTE — Telephone Encounter (Signed)
C/D 11/20/12 for appt. 11/25/12

## 2012-11-20 NOTE — Procedures (Signed)
Interventional Radiology Procedure Note  Procedure: Placement of a right IJ approach single lumen PowerPort.  Tip is positioned at the superior cavoatrial junction and catheter is ready for immediate use.  Complications: No immediate Recommendations:  - Ok to shower tomorrow - Do not submerge for 7 days - Routine line care   Signed,  Celedonio Sortino K. Barnard Sharps, MD Vascular & Interventional Radiologist [facility]  

## 2012-11-20 NOTE — Telephone Encounter (Signed)
Pt in hospital [facility]. Called pt's nurse in ref to f/u appt on 11/25/12@12 :30. Pt is aware.

## 2012-11-20 NOTE — Progress Notes (Signed)
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 Path confirmed squamous cell carcinoma.  - MRI of liver ordered: Again demonstrated are an infiltrative mass of the distal esophagus with enlarged distal para esophageal and celiac nodes worrisome for nodal metastases. Recommended PET/CT for liver metastasis . Right lung nodule likely metastasis. -  Oncology was consulted and per Dr. Karel Jarvis recommendations on 11/16/12-Need nutritional support and J-tube placement, Bronch to r/o fistula,  IV access (port placement). F/u in the onc clinic once he is D/ced for final plan but if locally advanced, will plan for concurrent chemo-rad first then evaluation for surgical resection. If metastatic diease, he will be receiving systemic chemotherapy. -IR was consulted and patient underwent G-tube placement for nutritional support. Tube feed started, to goal of 60 cc an hour, will start free water flushes 200 cc 4 times a day once off IV fluids - Port placed today on 7/18, appreciate IR assistance - Pulmonology was consulted, Ba esophagus ordered to rule out any fistula   Protein calorie malnutrition  - G tube 7/15 and then feeds started 7/16, check Mg, Phos, K daily x 3 days  - IVF until tube feeds at goal  Pain  uncontrolled  -Pain is not well controlled with as needed Roxanol. Placed him on MS Contin 30 mg every 12 hours, IV Dilaudid as needed. If still not controlled, will request palliative care for pain management.  DVT Prophylaxis:  Code Status: FC  Disposition: Hopefully over the weekend if pain is better controlled and pending pulmonology workup    Subjective: Tolerating tube feeds, complaining of  pain in the abdomen, port placed today  Objective: Weight change:   Intake/Output Summary (Last 24 hours) at 11/20/12 1603 Last data filed at 11/20/12 1557  Gross per 24 hour  Intake 2880.58 ml  Output   1875 ml  Net 1005.58 ml   Blood pressure 128/107, pulse 77, temperature 98.1 F (36.7 C), temperature source Oral, resp. rate 18, height 5\' 1"  (1.549 m), weight 58.196 kg (128 lb 4.8 oz), SpO2 99.00%.  Physical Exam: General: Alert and awake, oriented x3, not in any acute distress. CVS: S1-S2 clear, no murmur rubs or gallops Chest: clear to auscultation bilaterally, no wheezing, rales or rhonchi. Port + Abdomen: soft, tender diffusely,  normal bowel sounds G-tube + Extremities: no cyanosis, clubbing or edema noted bilaterally   Lab Results: Basic Metabolic Panel:  Recent Labs Lab 11/19/12 0455 11/20/12 0545  NA 137 137  K 4.1 4.4  CL 106 103  CO2 27 29  GLUCOSE 116* 106*  BUN 4* 9  CREATININE 0.93 1.05  CALCIUM 9.6 9.9  MG 1.8 2.0  PHOS 3.0 3.1   Liver Function Tests: No results found for this basename: AST, ALT, ALKPHOS, BILITOT, PROT, ALBUMIN,  in the last 168 hours  Recent Labs Lab 11/15/12 1547  LIPASE 16   No results found for this basename: AMMONIA,  in the last 168 hours CBC:  Recent Labs Lab 11/13/12 1709 11/18/12 0550  WBC 6.3 5.8  HGB 11.3* 11.8*  HCT 34.3* 35.0*  MCV 83.5 81.6  PLT 212 207   Cardiac Enzymes: No results found for this basename: CKTOTAL, CKMB, CKMBINDEX,  TROPONINI,  in the last 168 hours BNP: No components found with this basename: POCBNP,  CBG:  Recent Labs Lab 11/19/12 2005 11/20/12 0011 11/20/12 0432 11/20/12 0813 11/20/12 1203  GLUCAP 110* 91 104* 98 111*     Micro Results: No results found for this or any previous visit (from the past 240 hour(s)).  Studies/Results: Ct Chest W Contrast  11/13/2012   *RADIOLOGY REPORT*  Clinical Data:  Esophageal carcinoma.  CT CHEST, ABDOMEN AND PELVIS WITH CONTRAST   Technique:  Multidetector CT imaging of the chest, abdomen and pelvis was performed following the standard protocol during bolus administration of intravenous contrast.  Contrast: OMNIPAQUE IOHEXOL 300 MG/ML  SOLN  Comparison:   None.  CT CHEST  Findings:  A bulky annular soft tissue mass is seen involving the distal thoracic esophagus which measures approximately 3.2 x 4.0 x 5.9 cm.  Para esophageal lymphadenopathy is seen within the posterior mediastinum both anterior and posterior to the descending thoracic aorta.  Largest lymph node measures 1.5 cm on image 33. Mediastinal lymphadenopathy also seen in the right paratracheal region measuring 2.0 x 2.8 cm on image 10. A 1.3 cm right hilar lymph node is seen with a small central focus of calcification. This could represent metastatic disease or partially calcified granulomatous disease.  No evidence of pleural or pericardial effusion. A 6 mm noncalcified pulmonary nodule is seen in the right lower lobe on image 29, which raises suspicion for pulmonary metastasis.  No other suspicious pulmonary nodules or masses are identified.  IMPRESSION:  1.  Bulky annular soft tissue mass involving the distal thoracic esophagus, consistent with known primary esophageal carcinoma. 2.  Metastatic lymphadenopathy in the posterior mediastinum and right paratracheal region.  Mild right hilar lymphadenopathy, possibly also secondary to metastatic disease. 3.  6 mm noncalcified right lower lobe pulmonary nodule.  Pulmonary metastasis cannot be excluded.  Consider PET CT scan for further evaluation  CT ABDOMEN AND PELVIS  Findings:  Upper abdominal lymphadenopathy is seen gastrohepatic ligament measuring 2.9 x 3.8 cm on image 47.  No other pathologically enlarged nodes are seen within the abdomen or pelvis.  A hypovascular lesion is seen in the posterior segment right hepatic lobe measuring 1.5 cm on image 37.  This is suspicious for a liver metastasis.  No other liver lesions are  identified.  The gallbladder, pancreas, spleen, adrenal glands, kidneys are normal in appearance.  No evidence of hydronephrosis.  No pelvic masses or lymphadenopathy identified.  No evidence of inflammatory process or abnormal fluid collections.  No suspicious bone lesions identified.  IMPRESSION:  1.  Metastatic upper abdominal lymphadenopathy in the gastrohepatic ligament. 2.  1.5 cm hypovascular lesion in the posterior segment of the right hepatic lobe, suspicious for liver metastasis.  Consider PET CT scan or abdomen MRI without and with contrast for further evaluation.   Original Report Authenticated By: Myles Rosenthal, M.D.   Ct Abdomen Pelvis W Contrast  11/13/2012   *RADIOLOGY REPORT*  Clinical Data:  Esophageal carcinoma.  CT CHEST, ABDOMEN AND PELVIS WITH CONTRAST  Technique:  Multidetector CT imaging of the chest, abdomen and pelvis was performed following the standard protocol during bolus administration of intravenous contrast.  Contrast: OMNIPAQUE IOHEXOL 300 MG/ML  SOLN  Comparison:   None.  CT CHEST  Findings:  A bulky annular soft tissue mass is seen involving the distal thoracic esophagus which measures approximately 3.2 x 4.0 x 5.9 cm.  Para esophageal lymphadenopathy is seen within the posterior  mediastinum both anterior and posterior to the descending thoracic aorta.  Largest lymph node measures 1.5 cm on image 33. Mediastinal lymphadenopathy also seen in the right paratracheal region measuring 2.0 x 2.8 cm on image 10. A 1.3 cm right hilar lymph node is seen with a small central focus of calcification. This could represent metastatic disease or partially calcified granulomatous disease.  No evidence of pleural or pericardial effusion. A 6 mm noncalcified pulmonary nodule is seen in the right lower lobe on image 29, which raises suspicion for pulmonary metastasis.  No other suspicious pulmonary nodules or masses are identified.  IMPRESSION:  1.  Bulky annular soft tissue mass involving the  distal thoracic esophagus, consistent with known primary esophageal carcinoma. 2.  Metastatic lymphadenopathy in the posterior mediastinum and right paratracheal region.  Mild right hilar lymphadenopathy, possibly also secondary to metastatic disease. 3.  6 mm noncalcified right lower lobe pulmonary nodule.  Pulmonary metastasis cannot be excluded.  Consider PET CT scan for further evaluation  CT ABDOMEN AND PELVIS  Findings:  Upper abdominal lymphadenopathy is seen gastrohepatic ligament measuring 2.9 x 3.8 cm on image 47.  No other pathologically enlarged nodes are seen within the abdomen or pelvis.  A hypovascular lesion is seen in the posterior segment right hepatic lobe measuring 1.5 cm on image 37.  This is suspicious for a liver metastasis.  No other liver lesions are identified.  The gallbladder, pancreas, spleen, adrenal glands, kidneys are normal in appearance.  No evidence of hydronephrosis.  No pelvic masses or lymphadenopathy identified.  No evidence of inflammatory process or abnormal fluid collections.  No suspicious bone lesions identified.  IMPRESSION:  1.  Metastatic upper abdominal lymphadenopathy in the gastrohepatic ligament. 2.  1.5 cm hypovascular lesion in the posterior segment of the right hepatic lobe, suspicious for liver metastasis.  Consider PET CT scan or abdomen MRI without and with contrast for further evaluation.   Original Report Authenticated By: Myles Rosenthal, M.D.   Ir Gastrostomy Tube Mod Sed  11/17/2012   *RADIOLOGY REPORT*  Indication:  Esophageal mass, unable to place gastrostomy tube via endoscopic approach  "PUSH" PERCUTANEOUS GASTOSTOMY TUBE PLACEMENT  Comparison: CT abdomen pelvis - 11/13/2012; abdominal MRI - earlier same day  Medications:  Versed 3 mg IV; Fentanyl 100 mcg IV; Glucagon 1 gm IV; Ancef 2 grams IV; Antibiotics were administered within 1 hour of the procedure.  Contrast volume:  25 mL Omnipaque-300 administered into the gastric lumen  Sedation time: 45  minutes  Fluoroscopy time: 9 minutes, 6 seconds  Complications: None immediate  PROCEDURE/FINDINGS:  Informed written consent was obtained from the patient and the patient's daughter via the use of a Medical translator following explanation of the procedure, risks, benefits and alternatives.  A time out was performed prior to the initiation of the procedure. Ultrasound scanning was performed to demarcate the edge of the left lobe of the liver.  Maximal barrier sterile technique utilized including caps, mask, sterile gowns, sterile gloves, large sterile drape, hand hygiene and Betadine prep.  The left upper quadrant was sterilely prepped and draped.  An oral gastric catheter was inserted into the stomach under fluoroscopy. The left costal margin and air opacified transverse colon were identified and avoided.  Air was injected into the stomach for insufflation and visualization under fluoroscopy.  Under sterile conditions 3 T-tacts were deployed to pexy the stomach to the anterior abdominal wall percutaneously beneath the left subcostal margin after the overlying soft tissues were anesthetized with 1%  Lidocaine with epinephrine.  Each needle position was confirmed within the stomach with aspiration of air and injection of small amount of contrast.  Multiple spot fluoroscopic images were obtained to confirm appropriate intraluminal positioning.  An incision was made between the T tacks and an 18 gauge needle was utilized to access the stomach.  Again, a small amount of air was aspirated and a small amount of contrast was injected confirming appropriate intraluminal positioning.  Over an Amplatz guide wire, the access needle was exchanged for a Kumpe catheter.   With the use of a stiff Glidewire, the Kumpe catheter was manipulated into the descending portion of the duodenum.  Contrast injection confirmed appropriate positioning.  Under intermittent fluoroscopic guidance, Kumpe catheter was exchanged for the telescoping  peel away sheath.  This ultimately allowed placement of e 20-French balloon retention gastrostomy tube.   The balloon was insufflated within the gastric lumen and pulled taut against the ventral wall of the stomach.  Contrast injection confirms appropriate positioning within the stomach. Several spot radiographic images were obtained in various obliquities for documentation.  The patient tolerated procedure well without immediate post procedural complication.  IMPRESSION:  Successful fluoroscopic insertion of an 18-French "push" gastrostomy.   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 Liver lesion on CT.  MRI ABDOMEN WITHOUT AND WITH CONTRAST  Technique:  Multiplanar multisequence MR imaging of the abdomen was performed both before and after the administration of intravenous contrast.  Contrast: 12mL MULTIHANCE GADOBENATE DIMEGLUMINE 529 MG/ML IV SOLN  Comparison:  CTs of the chest, abdomen and pelvis 11/13/2012.  Findings:  Study is mildly motion degraded, especially on the postcontrast images.  The previously demonstrated lesion within the posterior dome of the right hepatic lobe measures 1.4 cm in diameter.  This lesion demonstrates homogeneous low T1 and heterogeneous T2 signal.  There is associated restricted diffusion. Postcontrast, this lesion demonstrates peripheral enhancement which appears continuous.  This is at least moderately suspicious for a metastasis.  No other liver lesions are identified.  The previously demonstrated infiltrative mass involving the distal esophagus appears grossly unchanged.  Adjacent distal para esophageal and celiac lymph nodes are unchanged, worrisome for metastatic disease.  There is a right lower lobe pulmonary nodule which was seen on the CT.  The spleen, adrenal glands, gallbladder, pancreas and kidneys appear  normal.  There are no worrisome osseous findings.  IMPRESSION:  1.  Again demonstrated are an infiltrative mass of the distal esophagus with enlarged distal para esophageal and celiac nodes worrisome for nodal metastases. 2.  The previously demonstrated lesion in the posterior segment of the right hepatic lobe has MR features which are at least moderately suspicious for a metastasis.  Consider tissue sampling. This may be difficult to biopsy percutaneously given its position in the dome of the right hepatic lobe.  Therefore, PET CT might prove helpful. 3.  Right lower lobe pulmonary nodule as seen on CT, also suspicious for a possible metastasis.   Original Report Authenticated By: Carey Bullocks, M.D.    Medications: Scheduled Meds: .  ceFAZolin (ANCEF) IV  2 g Intravenous Once  . feeding supplement  30 mL Per Tube BID  . morphine  30 mg Oral Q12H  . pantoprazole (PROTONIX) IV  40 mg Intravenous Q24H      LOS: 7 days   Sofiya Ezelle M.D.  [facility] 11/20/2012, 4:03 PM Pager: [phone]  If 7PM-7AM, please contact night-coverage [email or web address] Password [email or web address]

## 2012-11-20 NOTE — ED Notes (Signed)
NS 200 cc bolus given

## 2012-11-21 ENCOUNTER — Inpatient Hospital Stay (HOSPITAL_COMMUNITY): Payer: MEDICAID

## 2012-11-21 LAB — PHOSPHORUS: Phosphorus: 3.8 mg/dL (ref 2.3–4.6)

## 2012-11-21 LAB — BASIC METABOLIC PANEL
BUN: 13 mg/dL (ref 6–23)
CO2: 31 mEq/L (ref 19–32)
Chloride: 102 mEq/L (ref 96–112)
Creatinine, Ser: 1 mg/dL (ref 0.50–1.35)
Glucose, Bld: 99 mg/dL (ref 70–99)

## 2012-11-21 LAB — GLUCOSE, CAPILLARY
Glucose-Capillary: 103 mg/dL — ABNORMAL HIGH (ref 70–99)
Glucose-Capillary: 116 mg/dL — ABNORMAL HIGH (ref 70–99)
Glucose-Capillary: 118 mg/dL — ABNORMAL HIGH (ref 70–99)

## 2012-11-21 MED ORDER — WHITE PETROLATUM GEL
Status: AC
  Administered 2012-11-21: 14:00:00
  Filled 2012-11-21: qty 5

## 2012-11-21 MED ORDER — FREE WATER
120.0000 mL | Freq: Four times a day (QID) | Status: DC
  Administered 2012-11-21 – 2012-11-22 (×3): 120 mL

## 2012-11-21 MED ORDER — OSMOLITE 1.2 CAL PO LIQD
ORAL | Status: AC
  Filled 2012-11-21: qty 1000

## 2012-11-21 MED ORDER — FREE WATER
200.0000 mL | Freq: Four times a day (QID) | Status: DC
  Administered 2012-11-21 (×2): 200 mL

## 2012-11-21 MED ORDER — FREE WATER
200.0000 mL | Freq: Every day | Status: DC
  Administered 2012-11-22: 200 mL

## 2012-11-21 MED ORDER — OSMOLITE 1.2 CAL PO LIQD
355.0000 mL | Freq: Four times a day (QID) | ORAL | Status: DC
  Administered 2012-11-21 – 2012-11-22 (×3): 355 mL
  Filled 2012-11-21 (×9): qty 474

## 2012-11-21 NOTE — Progress Notes (Signed)
Called and spoke with RD about home bolus feeds and recommendations as well as she will let RD know at [facility] know about pt so she can follow up once pt starts chemo.

## 2012-11-21 NOTE — Progress Notes (Signed)
Paged by RN for change to bolus TF in anticipation for d/c home.  Current TF: Osmolite 1.2 @ 60 ml/hr with 30 ml Prostat BID and 200 ml H2O flush every 6 hours.  New TF regimen will be: 1.5 cans Osmolite 1.2 QID 30 ml Prostat BID mixed with 60ml H2O 60 ml H2O flush before and after each bolus feeding 200 ml H2O flush daily  TF regimen will provide: 1910 kcal, 109 grams protein and 1170 ml H2O, total free water: 1960 ml   Kendell Bane RD, LDN, CNSC [phone] Pager [phone] After Hours Pager

## 2012-11-21 NOTE — Progress Notes (Signed)
Called and left message for Case Management for Discharge Planning for potential discharge for tomorrow, 11/22/12.

## 2012-11-21 NOTE — Progress Notes (Signed)
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 Path confirmed squamous cell carcinoma.  - MRI of liver ordered: Again demonstrated are an infiltrative mass of the distal esophagus with enlarged distal para esophageal and celiac nodes worrisome for nodal metastases. Recommended PET/CT for liver metastasis . Right lung nodule likely metastasis. -  Oncology was consulted and per Dr. Karel Jarvis recommendations on 11/16/12-Need nutritional support and J-tube placement, Bronch to r/o fistula,  IV access (port placement). F/u in the onc clinic once he is D/ced for final plan but if locally advanced, will plan for concurrent chemo-rad first then evaluation for surgical resection. If metastatic diease, he will be receiving systemic chemotherapy. -IR was consulted and patient underwent G-tube placement for nutritional support. Tube feed started, to goal of 60 cc an hour, will start free water flushes 200 cc 4 times a day once off IV fluids - Port placed today on 7/18, appreciate IR assistance - Pulmonology was consulted, Ba esophagus done today shows no fistula   Protein calorie malnutrition  - G tube 7/15 and then feeds started 7/16 - Tube feeds now at goal, DC IV fluids, start free water 200 cc qid  Pain  Uncontrolled better controlled today:  - continue current management  DVT Prophylaxis:  Code Status: FC  Disposition: Discussed in detail with patient's daughter, Douglas Jordan on the phone. She had several questions regarding tube feeding. Requested nutrition to assist with management at home bolus versus continuous. Has set up home health to comment home tomorrow to assist the family with tube feedings.      Subjective: Tolerating tube feeds at goal, abdominal pain better  Objective: Weight change:   Intake/Output Summary (Last 24 hours) at 11/21/12 1329 Last data filed at 11/21/12 0900  Gross per 24 hour  Intake 3423.5 ml  Output   1575 ml  Net 1848.5 ml   Blood pressure 127/65, pulse 76, temperature 97.9 F (36.6 C), temperature source Oral, resp. rate 18, height 5\' 1"  (1.549 m), weight 58.196 kg (128 lb 4.8 oz), SpO2 99.00%.  Physical Exam: General: Alert and awake, oriented x3, not in any acute distress. CVS: S1-S2 clear, no murmur rubs or gallops Chest: CTA B. Port + Abdomen: soft, tender diffusely,  normal bowel sounds G-tube + Extremities: no cyanosis, clubbing or edema noted bilaterally   Lab Results: Basic Metabolic Panel:  Recent Labs Lab 11/20/12 0545 11/21/12 0532  NA 137 137  K 4.4 4.6  CL 103 102  CO2 29 31  GLUCOSE 106* 99  BUN 9 13  CREATININE 1.05 1.00  CALCIUM 9.9 9.8  MG 2.0 2.1  PHOS 3.1 3.8   Liver Function Tests: No results found for this basename: AST, ALT, ALKPHOS, BILITOT, PROT, ALBUMIN,  in the last 168 hours  Recent Labs Lab 11/15/12 1547  LIPASE 16   No results found for this basename: AMMONIA,  in the last 168 hours CBC:  Recent Labs Lab 11/18/12 0550  WBC 5.8  HGB 11.8*  HCT 35.0*  MCV 81.6  PLT 207   Cardiac Enzymes: No results found for this basename: CKTOTAL, CKMB, CKMBINDEX, TROPONINI,  in the last 168 hours BNP: No components found with this basename:  POCBNP,  CBG:  Recent Labs Lab 11/20/12 1635 11/20/12 2000 11/20/12 2349 11/21/12 0354 11/21/12 0800  GLUCAP 104* 99 109* 101* 116*     Micro Results: No results found for this or any previous visit (from the past 240 hour(s)).  Studies/Results: Ct Chest W Contrast  11/13/2012   *RADIOLOGY REPORT*  Clinical Data:  Esophageal carcinoma.  CT CHEST, ABDOMEN AND PELVIS WITH CONTRAST  Technique:  Multidetector CT imaging of the chest, abdomen and pelvis  was performed following the standard protocol during bolus administration of intravenous contrast.  Contrast: OMNIPAQUE IOHEXOL 300 MG/ML  SOLN  Comparison:   None.  CT CHEST  Findings:  A bulky annular soft tissue mass is seen involving the distal thoracic esophagus which measures approximately 3.2 x 4.0 x 5.9 cm.  Para esophageal lymphadenopathy is seen within the posterior mediastinum both anterior and posterior to the descending thoracic aorta.  Largest lymph node measures 1.5 cm on image 33. Mediastinal lymphadenopathy also seen in the right paratracheal region measuring 2.0 x 2.8 cm on image 10. A 1.3 cm right hilar lymph node is seen with a small central focus of calcification. This could represent metastatic disease or partially calcified granulomatous disease.  No evidence of pleural or pericardial effusion. A 6 mm noncalcified pulmonary nodule is seen in the right lower lobe on image 29, which raises suspicion for pulmonary metastasis.  No other suspicious pulmonary nodules or masses are identified.  IMPRESSION:  1.  Bulky annular soft tissue mass involving the distal thoracic esophagus, consistent with known primary esophageal carcinoma. 2.  Metastatic lymphadenopathy in the posterior mediastinum and right paratracheal region.  Mild right hilar lymphadenopathy, possibly also secondary to metastatic disease. 3.  6 mm noncalcified right lower lobe pulmonary nodule.  Pulmonary metastasis cannot be excluded.  Consider PET CT scan for further evaluation  CT ABDOMEN AND PELVIS  Findings:  Upper abdominal lymphadenopathy is seen gastrohepatic ligament measuring 2.9 x 3.8 cm on image 47.  No other pathologically enlarged nodes are seen within the abdomen or pelvis.  A hypovascular lesion is seen in the posterior segment right hepatic lobe measuring 1.5 cm on image 37.  This is suspicious for a liver metastasis.  No other liver lesions are identified.  The gallbladder, pancreas, spleen, adrenal glands,  kidneys are normal in appearance.  No evidence of hydronephrosis.  No pelvic masses or lymphadenopathy identified.  No evidence of inflammatory process or abnormal fluid collections.  No suspicious bone lesions identified.  IMPRESSION:  1.  Metastatic upper abdominal lymphadenopathy in the gastrohepatic ligament. 2.  1.5 cm hypovascular lesion in the posterior segment of the right hepatic lobe, suspicious for liver metastasis.  Consider PET CT scan or abdomen MRI without and with contrast for further evaluation.   Original Report Authenticated By: Myles Rosenthal, M.D.   Ct Abdomen Pelvis W Contrast  11/13/2012   *RADIOLOGY REPORT*  Clinical Data:  Esophageal carcinoma.  CT CHEST, ABDOMEN AND PELVIS WITH CONTRAST  Technique:  Multidetector CT imaging of the chest, abdomen and pelvis was performed following the standard protocol during bolus administration of intravenous contrast.  Contrast: OMNIPAQUE IOHEXOL 300 MG/ML  SOLN  Comparison:   None.  CT CHEST  Findings:  A bulky annular soft tissue mass is seen involving the distal thoracic esophagus which measures approximately 3.2 x 4.0 x 5.9 cm.  Para esophageal lymphadenopathy is seen within the posterior mediastinum both anterior and posterior to the descending thoracic aorta.  Largest lymph node  measures 1.5 cm on image 33. Mediastinal lymphadenopathy also seen in the right paratracheal region measuring 2.0 x 2.8 cm on image 10. A 1.3 cm right hilar lymph node is seen with a small central focus of calcification. This could represent metastatic disease or partially calcified granulomatous disease.  No evidence of pleural or pericardial effusion. A 6 mm noncalcified pulmonary nodule is seen in the right lower lobe on image 29, which raises suspicion for pulmonary metastasis.  No other suspicious pulmonary nodules or masses are identified.  IMPRESSION:  1.  Bulky annular soft tissue mass involving the distal thoracic esophagus, consistent with known primary  esophageal carcinoma. 2.  Metastatic lymphadenopathy in the posterior mediastinum and right paratracheal region.  Mild right hilar lymphadenopathy, possibly also secondary to metastatic disease. 3.  6 mm noncalcified right lower lobe pulmonary nodule.  Pulmonary metastasis cannot be excluded.  Consider PET CT scan for further evaluation  CT ABDOMEN AND PELVIS  Findings:  Upper abdominal lymphadenopathy is seen gastrohepatic ligament measuring 2.9 x 3.8 cm on image 47.  No other pathologically enlarged nodes are seen within the abdomen or pelvis.  A hypovascular lesion is seen in the posterior segment right hepatic lobe measuring 1.5 cm on image 37.  This is suspicious for a liver metastasis.  No other liver lesions are identified.  The gallbladder, pancreas, spleen, adrenal glands, kidneys are normal in appearance.  No evidence of hydronephrosis.  No pelvic masses or lymphadenopathy identified.  No evidence of inflammatory process or abnormal fluid collections.  No suspicious bone lesions identified.  IMPRESSION:  1.  Metastatic upper abdominal lymphadenopathy in the gastrohepatic ligament. 2.  1.5 cm hypovascular lesion in the posterior segment of the right hepatic lobe, suspicious for liver metastasis.  Consider PET CT scan or abdomen MRI without and with contrast for further evaluation.   Original Report Authenticated By: Myles Rosenthal, M.D.   Ir Gastrostomy Tube Mod Sed  11/17/2012   *RADIOLOGY REPORT*  Indication:  Esophageal mass, unable to place gastrostomy tube via endoscopic approach  "PUSH" PERCUTANEOUS GASTOSTOMY TUBE PLACEMENT  Comparison: CT abdomen pelvis - 11/13/2012; abdominal MRI - earlier same day  Medications:  Versed 3 mg IV; Fentanyl 100 mcg IV; Glucagon 1 gm IV; Ancef 2 grams IV; Antibiotics were administered within 1 hour of the procedure.  Contrast volume:  25 mL Omnipaque-300 administered into the gastric lumen  Sedation time: 45 minutes  Fluoroscopy time: 9 minutes, 6 seconds   Complications: None immediate  PROCEDURE/FINDINGS:  Informed written consent was obtained from the patient and the patient's daughter via the use of a Medical translator following explanation of the procedure, risks, benefits and alternatives.  A time out was performed prior to the initiation of the procedure. Ultrasound scanning was performed to demarcate the edge of the left lobe of the liver.  Maximal barrier sterile technique utilized including caps, mask, sterile gowns, sterile gloves, large sterile drape, hand hygiene and Betadine prep.  The left upper quadrant was sterilely prepped and draped.  An oral gastric catheter was inserted into the stomach under fluoroscopy. The left costal margin and air opacified transverse colon were identified and avoided.  Air was injected into the stomach for insufflation and visualization under fluoroscopy.  Under sterile conditions 3 T-tacts were deployed to pexy the stomach to the anterior abdominal wall percutaneously beneath the left subcostal margin after the overlying soft tissues were anesthetized with 1% Lidocaine with epinephrine.  Each needle position was confirmed within the stomach with aspiration  of air and injection of small amount of contrast.  Multiple spot fluoroscopic images were obtained to confirm appropriate intraluminal positioning.  An incision was made between the T tacks and an 18 gauge needle was utilized to access the stomach.  Again, a small amount of air was aspirated and a small amount of contrast was injected confirming appropriate intraluminal positioning.  Over an Amplatz guide wire, the access needle was exchanged for a Kumpe catheter.   With the use of a stiff Glidewire, the Kumpe catheter was manipulated into the descending portion of the duodenum.  Contrast injection confirmed appropriate positioning.  Under intermittent fluoroscopic guidance, Kumpe catheter was exchanged for the telescoping peel away sheath.  This ultimately allowed  placement of e 20-French balloon retention gastrostomy tube.   The balloon was insufflated within the gastric lumen and pulled taut against the ventral wall of the stomach.  Contrast injection confirms appropriate positioning within the stomach. Several spot radiographic images were obtained in various obliquities for documentation.  The patient tolerated procedure well without immediate post procedural complication.  IMPRESSION:  Successful fluoroscopic insertion of an 18-French "push" gastrostomy.   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 Liver lesion on CT.  MRI ABDOMEN WITHOUT AND WITH CONTRAST  Technique:  Multiplanar multisequence MR imaging of the abdomen was performed both before and after the administration of intravenous contrast.  Contrast: 12mL MULTIHANCE GADOBENATE DIMEGLUMINE 529 MG/ML IV SOLN  Comparison:  CTs of the chest, abdomen and pelvis 11/13/2012.  Findings:  Study is mildly motion degraded, especially on the postcontrast images.  The previously demonstrated lesion within the posterior dome of the right hepatic lobe measures 1.4 cm in diameter.  This lesion demonstrates homogeneous low T1 and heterogeneous T2 signal.  There is associated restricted diffusion. Postcontrast, this lesion demonstrates peripheral enhancement which appears continuous.  This is at least moderately suspicious for a metastasis.  No other liver lesions are identified.  The previously demonstrated infiltrative mass involving the distal esophagus appears grossly unchanged.  Adjacent distal para esophageal and celiac lymph nodes are unchanged, worrisome for metastatic disease.  There is a right lower lobe pulmonary nodule which was seen on the CT.  The spleen, adrenal glands, gallbladder, pancreas and kidneys appear normal.  There are no worrisome osseous  findings.  IMPRESSION:  1.  Again demonstrated are an infiltrative mass of the distal esophagus with enlarged distal para esophageal and celiac nodes worrisome for nodal metastases. 2.  The previously demonstrated lesion in the posterior segment of the right hepatic lobe has MR features which are at least moderately suspicious for a metastasis.  Consider tissue sampling. This may be difficult to biopsy percutaneously given its position in the dome of the right hepatic lobe.  Therefore, PET CT might prove helpful. 3.  Right lower lobe pulmonary nodule as seen on CT, also suspicious for a possible metastasis.   Original Report Authenticated By: Carey Bullocks, M.D.    Medications: Scheduled Meds: . feeding supplement  30 mL Per Tube BID  . morphine  30 mg Oral Q12H  . pantoprazole (PROTONIX) IV  40 mg Intravenous Q24H      LOS: 8 days   Melva Faux M.D. [facility] 11/21/2012, 1:29 PM Pager: [phone]  If 7PM-7AM, please contact night-coverage [email or web address] Password [email or web address]

## 2012-11-22 LAB — GLUCOSE, CAPILLARY

## 2012-11-22 MED ORDER — FREE WATER: Status: DC

## 2012-11-22 MED ORDER — FREE WATER: 200.0000 mL | Freq: Every day | Status: AC

## 2012-11-22 MED ORDER — OSMOLITE 1.2 CAL PO LIQD: 355.0000 mL | Freq: Four times a day (QID) | ORAL | Status: DC

## 2012-11-22 MED ORDER — OMEPRAZOLE 2 MG/ML ORAL SUSPENSION: 20.0000 mg | Freq: Every day | ORAL | Status: DC

## 2012-11-22 MED ORDER — FREE WATER: 120.0000 mL | Freq: Four times a day (QID) | Status: DC

## 2012-11-22 MED ORDER — PRO-STAT SUGAR FREE PO LIQD: 30.0000 mL | Freq: Two times a day (BID) | ORAL | Status: DC

## 2012-11-22 MED ORDER — DOCUSATE SODIUM 100 MG PO CAPS: 100.0000 mg | ORAL_CAPSULE | Freq: Two times a day (BID) | ORAL | Status: DC

## 2012-11-22 MED ORDER — OSMOLITE 1.2 CAL PO LIQD
ORAL | Status: AC
  Filled 2012-11-22: qty 1000

## 2012-11-22 MED ORDER — DOCUSATE SODIUM 50 MG/5ML PO LIQD: 100.0000 mg | Freq: Two times a day (BID) | ORAL | Status: DC

## 2012-11-22 MED ORDER — MORPHINE SULFATE 10 MG/5ML PO SOLN: 5.0000 mg | ORAL | Status: DC | PRN

## 2012-11-22 MED ORDER — POLYETHYLENE GLYCOL 3350 17 G PO PACK: 17.0000 g | PACK | Freq: Every day | ORAL | Status: DC | PRN

## 2012-11-22 MED ORDER — MORPHINE SULFATE ER 30 MG PO TBCR: 30.0000 mg | EXTENDED_RELEASE_TABLET | Freq: Two times a day (BID) | ORAL | Status: DC

## 2012-11-22 MED ORDER — BISACODYL 10 MG RE SUPP: 10.0000 mg | RECTAL | Status: DC | PRN

## 2012-11-22 NOTE — Progress Notes (Signed)
   CARE MANAGEMENT NOTE 11/22/2012  Patient:  Douglas Jordan,Douglas Jordan   Account Number:  [id number]  Date Initiated:  11/16/2012  Documentation initiated by:  Ronny Flurry  Subjective/Objective Assessment:     Action/Plan:   Anticipated DC Date:  11/20/2012   Anticipated DC Plan:  HOME W HOME HEALTH SERVICES  In-house referral  Financial Counselor      DC Planning Services  CM consult      Choice offered to / List presented to:     DME arranged  TUBE FEEDING  TUBE FEEDING PUMP      DME agency  [facility]     [facility] arranged  HH-1 RN      [facility] agency  [facility]   Status of service:  Completed, signed off Medicare Important Message given?   (If response is "NO", the following Medicare IM given date fields will be blank) Date Medicare IM given:   Date Additional Medicare IM given:    Discharge Disposition:  HOME W HOME HEALTH SERVICES  Per UR Regulation:    If discussed at Long Length of Stay Meetings, dates discussed:    Comments:  11/22/2012 1200 NCM made [facility] aware of dc home today. They have HH RN visit and supplies ready for this evening. Family instructed to contact [facility] when they arrive home. Isidoro Donning RN CCM Case Mgmt phone [phone]  11-18-12 Follow up appointment at [facility] located at [address] [phone] on Wednesday, 11/25/12 at 11:45 information added to discharge instructions  Ronny Flurry RN BSN [phone]    11-18-12 Face sheet information confirmed with patient's daughter Douglas Jordan ( [phone])  . Patient and his wife live with their son Douglas Jordan ( face sheet information is [employer] ) . Douglas Jordan interprets for his parents.  Douglas Jordan has her own business , she can be available to assist parents and be present for home health visits .  Referral made to [facility] . Kim wanting to know cost of tube feeds and HHRN . Explained [facility] will discuss that with her .   Ronny Flurry RN BSN [phone]    11-18-12 Emailed Fay Records at [facility] to schedule hospital follow up appointment for patient . Ronny Flurry RN BSN [phone]

## 2012-11-22 NOTE — Discharge Summary (Signed)
Physician Discharge Summary  Patient ID: Douglas Jordan MRN: [id number] DOB/AGE: Mar 02, 1930 79 y.o.  Admit date: 11/13/2012 Discharge date: 11/22/2012  Primary Care Physician:  No PCP Per Patient  Discharge Diagnoses:   Dysphagia with esophageal obstruction Metastatic esophageal adenocarcinoma- newly diagnosed G-tube feeding Uncontrolled pain Constipation   Consults:  Gastroenterology                     Oncology                      Pulmonology     Recommendations for Outpatient Follow-up:  1. please note G-tube was placed, patient has been started on clear fluids, home health nurse to follow up onto feeding and teaching to the family 2. port placed on 7/18  Allergies:  No Known Allergies   Discharge Medications:   Medication List         bisacodyl 10 MG suppository  Commonly known as:  DULCOLAX  Place 1 suppository (10 mg total) rectally as needed for constipation.     docusate 50 MG/5ML liquid  Commonly known as:  COLACE  Take 10 mLs (100 mg total) by mouth 2 (two) times daily.     feeding supplement (OSMOLITE 1.2 CAL) Liqd  Place 355 mLs into feeding tube 4 (four) times daily.     feeding supplement Liqd  Place 30 mLs into feeding tube 2 (two) times daily.     free water Soln  Place 200 mLs into feeding tube daily.     free water Soln  Use 60ml flush before and after each bolus feeding     morphine 30 MG 12 hr tablet  Commonly known as:  MS CONTIN  Take 1 tablet (30 mg total) by mouth every 12 (twelve) hours.     morphine 10 MG/5ML solution  Place 2.5 mLs (5 mg total) into feeding tube every 4 (four) hours as needed for pain.     omeprazole 2 mg/mL Susp  Commonly known as:  PRILOSEC  Place 10 mLs (20 mg total) into feeding tube daily.     OVER THE COUNTER MEDICATION  Take 1 tablet by mouth once. OTC med for ulcer     PROBIOTIC DAILY PO  Take 1 tablet by mouth once.         Brief H and P: For complete details please refer to admission H and P,  but in briefTuong Jordan is an 79 y.o. male with benign PMH except for GERD, untreated, presents with dysphagia and odynophagia. He said he has originally feeling solid food stuck about 3 months ago, and this has progressed to him unable to drink even liquid. He has gone from solid to soup, and has lost about 25 lbs over the last 3 months. Before, he was having GERD, with some food regurgitation. He had pain about the xyphoid process when he eats. There has been no black of bloody stool, and he has normal BM today. Evalaution in the ER included a normal Hb of 13.6. Other serology was unremarkable as well. Dr Leone Payor of GI was consulted and hospitalist was asked to accept patient to [facility] for admission.   Hospital Course:  Dysphagia due to esophageal obstruction from  metastatic esophageal adenocarcinoma Patient was admitted and gastroenterology was consulted.  - EGD done on 7/11 showed malignant appearing mass with proximal educated 27 cm from incisors proximal to mid esophagus  - biopsies from EGD. Path confirmed squamous cell carcinoma.  -  MRI of liver ordered: Again demonstrated are an infiltrative mass of the distal esophagus with enlarged distal para esophageal and celiac nodes worrisome for nodal metastases. Recommended PET/CT for liver metastasis . Right lung nodule likely metastasis.  - Oncology was consulted and per Dr. Karel Jarvis recommendations on 11/16/12-Need nutritional support and J-tube placement, Bronch to r/o fistula, IV access (port placement). F/u in the onc clinic once he is D/ced for final plan but if locally advanced, will plan for concurrent chemo-rad first then evaluation for surgical resection. If metastatic diease, he will be receiving systemic chemotherapy.  -IR was consulted and patient underwent G-tube placement for nutritional support. Tube feed started and at goal at discharge. Nutrition consult was also obtained to transition to home regimen - Port placed on 7/18 by interventional  radiology.  - Pulmonology was consulted, Ba esophagus done showed no fistula  Patient has followup appointment with oncology on 11/25/2012 for further management.  Protein calorie malnutrition  - G tube 7/15 and then feeds started 7/16, he is tolerating tube feeds very well. Patient and his family was explained in great detail about tube feeding by the RN staff. [facility].  Pain Uncontrolled: Patient was started on MS Contin and Roxanol. He was given prescription for bowel regimen as well.  Day of Discharge BP 115/67  Pulse 80  Temp(Src) 98.3 F (36.8 C) (Oral)  Resp 16  Ht 5\' 1"  (1.549 m)  Wt 53.207 kg (117 lb 4.8 oz)  BMI 22.18 kg/m2  SpO2 98%  Physical Exam:  General: Alert and awake, oriented x3, not in any acute distress.  CVS: S1-S2 clear, no murmur rubs or gallops  Chest: CTA B. Port +  Abdomen: soft, tender diffusely, normal bowel sounds G-tube +  Extremities: no cyanosis, clubbing or edema noted bilaterally   The results of significant diagnostics from this hospitalization (including imaging, microbiology, ancillary and laboratory) are listed below for reference.    LAB RESULTS: Basic Metabolic Panel:  Recent Labs Lab 11/20/12 0545 11/21/12 0532  NA 137 137  K 4.4 4.6  CL 103 102  CO2 29 31  GLUCOSE 106* 99  BUN 9 13  CREATININE 1.05 1.00  CALCIUM 9.9 9.8  MG 2.0 2.1  PHOS 3.1 3.8   Liver Function Tests: No results found for this basename: AST, ALT, ALKPHOS, BILITOT, PROT, ALBUMIN,  in the last 168 hours  Recent Labs Lab 11/15/12 1547  LIPASE 16   No results found for this basename: AMMONIA,  in the last 168 hours CBC:  Recent Labs Lab 11/18/12 0550  WBC 5.8  HGB 11.8*  HCT 35.0*  MCV 81.6  PLT 207   Cardiac Enzymes: No results found for this basename: CKTOTAL, CKMB, CKMBINDEX, TROPONINI,  in the last 168 hours BNP: No components found with this basename: POCBNP,  CBG:  Recent Labs Lab 11/22/12 0347  11/22/12 0800  GLUCAP 100* 105*    Significant Diagnostic Studies:  Ct Chest W Contrast  11/13/2012   *RADIOLOGY REPORT*  Clinical Data:  Esophageal carcinoma.  CT CHEST, ABDOMEN AND PELVIS WITH CONTRAST  Technique:  Multidetector CT imaging of the chest, abdomen and pelvis was performed following the standard protocol during bolus administration of intravenous contrast.  Contrast: OMNIPAQUE IOHEXOL 300 MG/ML  SOLN  Comparison:   None.  CT CHEST  Findings:  A bulky annular soft tissue mass is seen involving the distal thoracic esophagus which measures approximately 3.2 x 4.0 x 5.9 cm.  Para  esophageal lymphadenopathy is seen within the posterior mediastinum both anterior and posterior to the descending thoracic aorta.  Largest lymph node measures 1.5 cm on image 33. Mediastinal lymphadenopathy also seen in the right paratracheal region measuring 2.0 x 2.8 cm on image 10. A 1.3 cm right hilar lymph node is seen with a small central focus of calcification. This could represent metastatic disease or partially calcified granulomatous disease.  No evidence of pleural or pericardial effusion. A 6 mm noncalcified pulmonary nodule is seen in the right lower lobe on image 29, which raises suspicion for pulmonary metastasis.  No other suspicious pulmonary nodules or masses are identified.  IMPRESSION:  1.  Bulky annular soft tissue mass involving the distal thoracic esophagus, consistent with known primary esophageal carcinoma. 2.  Metastatic lymphadenopathy in the posterior mediastinum and right paratracheal region.  Mild right hilar lymphadenopathy, possibly also secondary to metastatic disease. 3.  6 mm noncalcified right lower lobe pulmonary nodule.  Pulmonary metastasis cannot be excluded.  Consider PET CT scan for further evaluation  CT ABDOMEN AND PELVIS  Findings:  Upper abdominal lymphadenopathy is seen gastrohepatic ligament measuring 2.9 x 3.8 cm on image 47.  No other pathologically enlarged nodes are  seen within the abdomen or pelvis.  A hypovascular lesion is seen in the posterior segment right hepatic lobe measuring 1.5 cm on image 37.  This is suspicious for a liver metastasis.  No other liver lesions are identified.  The gallbladder, pancreas, spleen, adrenal glands, kidneys are normal in appearance.  No evidence of hydronephrosis.  No pelvic masses or lymphadenopathy identified.  No evidence of inflammatory process or abnormal fluid collections.  No suspicious bone lesions identified.  IMPRESSION:  1.  Metastatic upper abdominal lymphadenopathy in the gastrohepatic ligament. 2.  1.5 cm hypovascular lesion in the posterior segment of the right hepatic lobe, suspicious for liver metastasis.  Consider PET CT scan or abdomen MRI without and with contrast for further evaluation.   Original Report Authenticated By: Myles Rosenthal, M.D.   Ct Abdomen Pelvis W Contrast  11/13/2012   *RADIOLOGY REPORT*  Clinical Data:  Esophageal carcinoma.  CT CHEST, ABDOMEN AND PELVIS WITH CONTRAST  Technique:  Multidetector CT imaging of the chest, abdomen and pelvis was performed following the standard protocol during bolus administration of intravenous contrast.  Contrast: OMNIPAQUE IOHEXOL 300 MG/ML  SOLN  Comparison:   None.  CT CHEST  Findings:  A bulky annular soft tissue mass is seen involving the distal thoracic esophagus which measures approximately 3.2 x 4.0 x 5.9 cm.  Para esophageal lymphadenopathy is seen within the posterior mediastinum both anterior and posterior to the descending thoracic aorta.  Largest lymph node measures 1.5 cm on image 33. Mediastinal lymphadenopathy also seen in the right paratracheal region measuring 2.0 x 2.8 cm on image 10. A 1.3 cm right hilar lymph node is seen with a small central focus of calcification. This could represent metastatic disease or partially calcified granulomatous disease.  No evidence of pleural or pericardial effusion. A 6 mm noncalcified pulmonary nodule is seen  in the right lower lobe on image 29, which raises suspicion for pulmonary metastasis.  No other suspicious pulmonary nodules or masses are identified.  IMPRESSION:  1.  Bulky annular soft tissue mass involving the distal thoracic esophagus, consistent with known primary esophageal carcinoma. 2.  Metastatic lymphadenopathy in the posterior mediastinum and right paratracheal region.  Mild right hilar lymphadenopathy, possibly also secondary to metastatic disease. 3.  6 mm noncalcified  right lower lobe pulmonary nodule.  Pulmonary metastasis cannot be excluded.  Consider PET CT scan for further evaluation  CT ABDOMEN AND PELVIS  Findings:  Upper abdominal lymphadenopathy is seen gastrohepatic ligament measuring 2.9 x 3.8 cm on image 47.  No other pathologically enlarged nodes are seen within the abdomen or pelvis.  A hypovascular lesion is seen in the posterior segment right hepatic lobe measuring 1.5 cm on image 37.  This is suspicious for a liver metastasis.  No other liver lesions are identified.  The gallbladder, pancreas, spleen, adrenal glands, kidneys are normal in appearance.  No evidence of hydronephrosis.  No pelvic masses or lymphadenopathy identified.  No evidence of inflammatory process or abnormal fluid collections.  No suspicious bone lesions identified.  IMPRESSION:  1.  Metastatic upper abdominal lymphadenopathy in the gastrohepatic ligament. 2.  1.5 cm hypovascular lesion in the posterior segment of the right hepatic lobe, suspicious for liver metastasis.  Consider PET CT scan or abdomen MRI without and with contrast for further evaluation.   Original Report Authenticated By: Myles Rosenthal, M.D.    Disposition and Follow-up:     Discharge Orders   Future Appointments Provider Department Dept Phone   11/25/2012 11:45 AM [facility] Covering Provider [facility] Joan Flores [phone]   11/25/2012 12:30 PM [facility]  [phone]   11/25/2012 12:45 PM Delcie Roch [facility] [phone]   11/25/2012 1:00 PM [facility] Covering Provider [facility] [place] [phone]   Future Orders Complete By Expires     Discharge instructions  As directed     Comments:      Discharge Diet: New TF regimen will be: 1.5 cans Osmolite1.2 QID (4 times/day)  30 ml Prostat BID mixed with 60ml H2O 60 ml H2O flush before and after each bolus feeding 200 ml H2O flush daily    Increase activity slowly  As directed         DISPOSITION: Home with home health   DISCHARGE FOLLOW-UP Follow-up Information   Follow up with Zachery Dakins, MD On 11/25/2012. (at 11:45AM)    Contact information:   [address] [place] [address] [phone]       Time spent on Discharge: 45 minutes  Signed:   Toyna Erisman M.D. [facility] 11/22/2012, 9:48 AM Pager: [phone]

## 2012-11-22 NOTE — Progress Notes (Signed)
Pt discharged to home accomp by son and wife.  Son instructed to call [facility] once he gets home, # given.

## 2012-11-22 NOTE — Progress Notes (Signed)
Pt and family ready to go home.  Pt's son at bedside to review all discharge instructions.  Copy of instructions given to pt/son as well as all Rx given and explained.  Pt's son and wife demonstrated x3 times correct technique of bolus tube feedings/flushings and giving liquid medicine through the G tube.  Showed them how to change dressing around the G tube as needed.  [facility] is to provide support after pt goes home.  Pt has follow up appts at the [facility] for 11/25/12.  Pt given PAC packet to take.

## 2012-11-25 ENCOUNTER — Inpatient Hospital Stay: Payer: Self-pay

## 2012-11-25 ENCOUNTER — Other Ambulatory Visit: Payer: Self-pay | Admitting: Lab

## 2012-11-25 ENCOUNTER — Ambulatory Visit: Payer: Self-pay

## 2012-11-25 ENCOUNTER — Encounter: Payer: Self-pay | Admitting: Hematology and Oncology

## 2012-11-25 ENCOUNTER — Telehealth: Payer: Self-pay | Admitting: Hematology and Oncology

## 2012-11-25 ENCOUNTER — Ambulatory Visit (HOSPITAL_BASED_OUTPATIENT_CLINIC_OR_DEPARTMENT_OTHER): Payer: Self-pay | Admitting: Hematology and Oncology

## 2012-11-25 VITALS — BP 147/73 | HR 84 | Temp 98.6°F | Resp 18 | Ht 60.0 in | Wt 119.5 lb

## 2012-11-25 DIAGNOSIS — C159 Malignant neoplasm of esophagus, unspecified: Secondary | ICD-10-CM

## 2012-11-25 DIAGNOSIS — C155 Malignant neoplasm of lower third of esophagus: Secondary | ICD-10-CM

## 2012-11-25 NOTE — Progress Notes (Signed)
[facility]  [phone] [phone]   MEDICAL ONCOLOGY - INITIAL CONSULATION    Referral MD  Reason for Referral: Esophageal Cancer  No chief complaint on file.   HPI:  We had the pleasure of seeing Douglas Jordan in our clinic today. Douglas Jordan 79 y.o. [place] male, non English speaking, who presented with 3 month history of progressive dysphagia, initially with solids progressing to liquids, with subsequent 25 lb weight loss. Symptoms were accompanied by significant regurgitation. 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  CT of the chest ion 7/11 reveals bulky annular soft tissue mass involving the distal thoracic esophagus, consistent with known primary esophageal carcinoma.  Lymphadenopathy in the posterior mediastinum and right paratracheal region. Mild right hilar lymphadenopathy, possibly also secondary to metastatic disease. and a 6 mm noncalcified right lower lobe pulmonary nodule. Also seen, Metastatic upper abdominal lymphadenopathy in the gastrohepatic ligament. and a 1.5 cm hypovascular lesion in the posterior segment of the right hepatic lobe, suspicious for liver metastasis.  He is to have a J-tube placement by IR as well as his port..       Past Medical History  Diagnosis Date  . GERD (gastroesophageal reflux disease) No knownn hx but, pt reports symptoms of mid sternal pain at xiphoid that is irriated when eating.  . Esophageal cancer 11/13/2012  :  Past Surgical History  Procedure Laterality Date  . Esophagogastroduodenoscopy (egd) with esophageal dilation N/A 11/13/2012    Procedure: ESOPHAGOGASTRODUODENOSCOPY (EGD) WITH ESOPHAGEAL DILATION;  Surgeon: Rachael Fee, MD;  Location: [facility] ENDOSCOPY;  Service: Endoscopy;  Laterality: N/A;  . Savory dilation N/A 11/13/2012    Procedure: SAVORY DILATION;   Surgeon: Rachael Fee, MD;  Location: [facility] ENDOSCOPY;  Service: Endoscopy;  Laterality: N/A;  :  Current Outpatient Prescriptions  Medication Sig Dispense Refill  . bisacodyl (DULCOLAX) 10 MG suppository Place 1 suppository (10 mg total) rectally as needed for constipation.  50 suppository  3  . docusate (COLACE) 50 MG/5ML liquid Take 10 mLs (100 mg total) by mouth 2 (two) times daily.  480 mL  3  . feeding supplement (PRO-STAT SUGAR FREE 64) LIQD Place 30 mLs into feeding tube 2 (two) times daily.  900 mL  30  . morphine (MS CONTIN) 30 MG 12 hr tablet Take 1 tablet (30 mg total) by mouth every 12 (twelve) hours.  60 tablet  0  . Nutritional Supplements (FEEDING SUPPLEMENT, OSMOLITE 1.2 CAL,) LIQD Place 355 mLs into feeding tube 4 (four) times daily.  1500 mL  30  . omeprazole (PRILOSEC) 2 mg/mL SUSP Place 10 mLs (20 mg total) into feeding tube daily.  100 mL  3  . OVER THE COUNTER MEDICATION Take 1 tablet by mouth once. OTC med for ulcer      . Probiotic Product (PROBIOTIC DAILY PO) Take 1 tablet by mouth once.      . Water For Irrigation, Sterile (FREE WATER) SOLN Place 200 mLs into feeding tube daily.      . Water For Irrigation, Sterile (FREE WATER) SOLN Use 60ml flush before and after each bolus feeding      . morphine 10 MG/5ML solution Place 2.5 mLs (5 mg total) into feeding tube every 4 (four) hours as needed for pain.  30 mL  0   No current facility-administered medications for this visit.     No  Known Allergies:  No family history on file.:  History   Social History  . Marital Status: Married    Spouse Name: N/A    Number of Children: N/A  . Years of Education: N/A   Occupational History  . Not on file.   Social History Main Topics  . Smoking status: Never Smoker   . Smokeless tobacco: Not on file  . Alcohol Use: No  . Drug Use: No  . Sexually Active: Not on file   Other Topics Concern  . Not on file   Social History Narrative  . No narrative on file   :  Pertinent items are noted in HPI.  Exam: Blood pressure 147/73, pulse 84, temperature 98.6 F (37 C), temperature source Oral, resp. rate 18, height 5' (1.524 m), weight 119 lb 8 oz (54.205 kg).   ECOG PERFORMANCE STATUS: 1 - Symptomatic but completely ambulatory  GENERAL: No distress  SKIN:  No rashes or significant lesions  HEAD: Normocephalic, No masses, lesions, tenderness or abnormalities  EYES: Conjunctiva are pink and non-injected  ENT: External ears normal ,lips , buccal mucosa, and tongue normal and mucous membranes are moist  LYMPH: No palpable lymphadenopathy  BREAST:Normal without mass, skin or nipple changes or axillary nodes,  LUNGS: Clear to auscultation, no crackles or wheezes HEART: Regular rate & rhythm, no murmurs, no gallops, S1 normal and S2 normal  ABDOMEN: Abdomen soft, non-tender, normal bowel sounds, no masses or organomegaly and no hepatosplenomegaly, J-tube in place. MSK: No CVA tenderness and no tenderness on percussion of the back or rib cage. EXTREMITIES: No edema, no skin discoloration or tenderness   Lab Results  Component Value Date   WBC 5.8 11/18/2012   HGB 11.8* 11/18/2012   HCT 35.0* 11/18/2012   PLT 207 11/18/2012   GLUCOSE 99 11/21/2012   NA 137 11/21/2012   K 4.6 11/21/2012   CL 102 11/21/2012   CREATININE 1.00 11/21/2012   BUN 13 11/21/2012   CO2 31 11/21/2012    Ct Chest W Contrast  11/13/2012   *RADIOLOGY REPORT*  Clinical Data:  Esophageal carcinoma.  CT CHEST, ABDOMEN AND PELVIS WITH CONTRAST  Technique:  Multidetector CT imaging of the chest, abdomen and pelvis was performed following the standard protocol during bolus administration of intravenous contrast.  Contrast: OMNIPAQUE IOHEXOL 300 MG/ML  SOLN  Comparison:   None.  CT CHEST  Findings:  A bulky annular soft tissue mass is seen involving the distal thoracic esophagus which measures approximately 3.2 x 4.0 x 5.9 cm.  Para esophageal lymphadenopathy is seen within the  posterior mediastinum both anterior and posterior to the descending thoracic aorta.  Largest lymph node measures 1.5 cm on image 33. Mediastinal lymphadenopathy also seen in the right paratracheal region measuring 2.0 x 2.8 cm on image 10. A 1.3 cm right hilar lymph node is seen with a small central focus of calcification. This could represent metastatic disease or partially calcified granulomatous disease.  No evidence of pleural or pericardial effusion. A 6 mm noncalcified pulmonary nodule is seen in the right lower lobe on image 29, which raises suspicion for pulmonary metastasis.  No other suspicious pulmonary nodules or masses are identified.  IMPRESSION:  1.  Bulky annular soft tissue mass involving the distal thoracic esophagus, consistent with known primary esophageal carcinoma. 2.  Metastatic lymphadenopathy in the posterior mediastinum and right paratracheal region.  Mild right hilar lymphadenopathy, possibly also secondary to metastatic disease. 3.  6 mm noncalcified right lower  lobe pulmonary nodule.  Pulmonary metastasis cannot be excluded.  Consider PET CT scan for further evaluation  CT ABDOMEN AND PELVIS  Findings:  Upper abdominal lymphadenopathy is seen gastrohepatic ligament measuring 2.9 x 3.8 cm on image 47.  No other pathologically enlarged nodes are seen within the abdomen or pelvis.  A hypovascular lesion is seen in the posterior segment right hepatic lobe measuring 1.5 cm on image 37.  This is suspicious for a liver metastasis.  No other liver lesions are identified.  The gallbladder, pancreas, spleen, adrenal glands, kidneys are normal in appearance.  No evidence of hydronephrosis.  No pelvic masses or lymphadenopathy identified.  No evidence of inflammatory process or abnormal fluid collections.  No suspicious bone lesions identified.  IMPRESSION:  1.  Metastatic upper abdominal lymphadenopathy in the gastrohepatic ligament. 2.  1.5 cm hypovascular lesion in the posterior segment of the  right hepatic lobe, suspicious for liver metastasis.  Consider PET CT scan or abdomen MRI without and with contrast for further evaluation.   Original Report Authenticated By: Myles Rosenthal, M.D.   Ct Abdomen Pelvis W Contrast  11/13/2012   *RADIOLOGY REPORT*  Clinical Data:  Esophageal carcinoma.  CT CHEST, ABDOMEN AND PELVIS WITH CONTRAST  Technique:  Multidetector CT imaging of the chest, abdomen and pelvis was performed following the standard protocol during bolus administration of intravenous contrast.  Contrast: OMNIPAQUE IOHEXOL 300 MG/ML  SOLN  Comparison:   None.  CT CHEST  Findings:  A bulky annular soft tissue mass is seen involving the distal thoracic esophagus which measures approximately 3.2 x 4.0 x 5.9 cm.  Para esophageal lymphadenopathy is seen within the posterior mediastinum both anterior and posterior to the descending thoracic aorta.  Largest lymph node measures 1.5 cm on image 33. Mediastinal lymphadenopathy also seen in the right paratracheal region measuring 2.0 x 2.8 cm on image 10. A 1.3 cm right hilar lymph node is seen with a small central focus of calcification. This could represent metastatic disease or partially calcified granulomatous disease.  No evidence of pleural or pericardial effusion. A 6 mm noncalcified pulmonary nodule is seen in the right lower lobe on image 29, which raises suspicion for pulmonary metastasis.  No other suspicious pulmonary nodules or masses are identified.  IMPRESSION:  1.  Bulky annular soft tissue mass involving the distal thoracic esophagus, consistent with known primary esophageal carcinoma. 2.  Metastatic lymphadenopathy in the posterior mediastinum and right paratracheal region.  Mild right hilar lymphadenopathy, possibly also secondary to metastatic disease. 3.  6 mm noncalcified right lower lobe pulmonary nodule.  Pulmonary metastasis cannot be excluded.  Consider PET CT scan for further evaluation  CT ABDOMEN AND PELVIS  Findings:  Upper  abdominal lymphadenopathy is seen gastrohepatic ligament measuring 2.9 x 3.8 cm on image 47.  No other pathologically enlarged nodes are seen within the abdomen or pelvis.  A hypovascular lesion is seen in the posterior segment right hepatic lobe measuring 1.5 cm on image 37.  This is suspicious for a liver metastasis.  No other liver lesions are identified.  The gallbladder, pancreas, spleen, adrenal glands, kidneys are normal in appearance.  No evidence of hydronephrosis.  No pelvic masses or lymphadenopathy identified.  No evidence of inflammatory process or abnormal fluid collections.  No suspicious bone lesions identified.  IMPRESSION:  1.  Metastatic upper abdominal lymphadenopathy in the gastrohepatic ligament. 2.  1.5 cm hypovascular lesion in the posterior segment of the right hepatic lobe, suspicious for liver metastasis.  Consider PET CT scan or abdomen MRI without and with contrast for further evaluation.   Original Report Authenticated By: Myles Rosenthal, M.D.   Ir Gastrostomy Tube Mod Sed  11/17/2012   *RADIOLOGY REPORT*  Indication:  Esophageal mass, unable to place gastrostomy tube via endoscopic approach  "PUSH" PERCUTANEOUS GASTOSTOMY TUBE PLACEMENT  Comparison: CT abdomen pelvis - 11/13/2012; abdominal MRI - earlier same day  Medications:  Versed 3 mg IV; Fentanyl 100 mcg IV; Glucagon 1 gm IV; Ancef 2 grams IV; Antibiotics were administered within 1 hour of the procedure.  Contrast volume:  25 mL Omnipaque-300 administered into the gastric lumen  Sedation time: 45 minutes  Fluoroscopy time: 9 minutes, 6 seconds  Complications: None immediate  PROCEDURE/FINDINGS:  Informed written consent was obtained from the patient and the patient's daughter via the use of a Medical translator following explanation of the procedure, risks, benefits and alternatives.  A time out was performed prior to the initiation of the procedure. Ultrasound scanning was performed to demarcate the edge of the left lobe of the  liver.  Maximal barrier sterile technique utilized including caps, mask, sterile gowns, sterile gloves, large sterile drape, hand hygiene and Betadine prep.  The left upper quadrant was sterilely prepped and draped.  An oral gastric catheter was inserted into the stomach under fluoroscopy. The left costal margin and air opacified transverse colon were identified and avoided.  Air was injected into the stomach for insufflation and visualization under fluoroscopy.  Under sterile conditions 3 T-tacts were deployed to pexy the stomach to the anterior abdominal wall percutaneously beneath the left subcostal margin after the overlying soft tissues were anesthetized with 1% Lidocaine with epinephrine.  Each needle position was confirmed within the stomach with aspiration of air and injection of small amount of contrast.  Multiple spot fluoroscopic images were obtained to confirm appropriate intraluminal positioning.  An incision was made between the T tacks and an 18 gauge needle was utilized to access the stomach.  Again, a small amount of air was aspirated and a small amount of contrast was injected confirming appropriate intraluminal positioning.  Over an Amplatz guide wire, the access needle was exchanged for a Kumpe catheter.   With the use of a stiff Glidewire, the Kumpe catheter was manipulated into the descending portion of the duodenum.  Contrast injection confirmed appropriate positioning.  Under intermittent fluoroscopic guidance, Kumpe catheter was exchanged for the telescoping peel away sheath.  This ultimately allowed placement of e 20-French balloon retention gastrostomy tube.   The balloon was insufflated within the gastric lumen and pulled taut against the ventral wall of the stomach.  Contrast injection confirms appropriate positioning within the stomach. Several spot radiographic images were obtained in various obliquities for documentation.  The patient tolerated procedure well without immediate post  procedural complication.  IMPRESSION:  Successful fluoroscopic insertion of an 18-French "push" gastrostomy.   Original Report Authenticated By: Tacey Ruiz, MD   Dg Esophagus  11/21/2012   *RADIOLOGY REPORT*  Clinical Data:Known esophageal carcinoma, evaluate for tracheoesophageal fistula  ESOPHAGUS/BARIUM SWALLOW/TABLET STUDY  Fluoroscopy Time: 0 minutes 51 seconds  Comparison: CT thorax 11/13/2012  Findings: Normal esophageal motility.  There is irregularity of the esophageal lumen and mucosa involving the junction of the mid to distal esophagus.  Contrast remains appropriately contained within the esophagus with no evidence of tracheoesophageal fistula or perforation.  This is a limited study as the patient can only tolerate small boluses.  This is sufficient to exclude tracheoesophageal fistula  that detailed evaluation of the esophagus is otherwise not obtained.  IMPRESSION: No evidence of tracheoesophageal fistula.   Original Report Authenticated By: Esperanza Heir, M.D.   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 Liver lesion on CT.  MRI ABDOMEN WITHOUT AND WITH CONTRAST  Technique:  Multiplanar multisequence MR imaging of the abdomen was performed both before and after the administration of intravenous contrast.  Contrast: 12mL MULTIHANCE GADOBENATE DIMEGLUMINE 529 MG/ML IV SOLN  Comparison:  CTs of the chest, abdomen and pelvis 11/13/2012.  Findings:  Study is mildly motion degraded, especially on the postcontrast images.  The previously demonstrated lesion within the posterior dome of the right hepatic lobe measures 1.4 cm in diameter.  This lesion demonstrates homogeneous low T1 and heterogeneous T2 signal.  There is associated restricted diffusion. Postcontrast, this lesion demonstrates peripheral enhancement which appears continuous.  This is at least moderately suspicious for a metastasis.   No other liver lesions are identified.  The previously demonstrated infiltrative mass involving the distal esophagus appears grossly unchanged.  Adjacent distal para esophageal and celiac lymph nodes are unchanged, worrisome for metastatic disease.  There is a right lower lobe pulmonary nodule which was seen on the CT.  The spleen, adrenal glands, gallbladder, pancreas and kidneys appear normal.  There are no worrisome osseous findings.  IMPRESSION:  1.  Again demonstrated are an infiltrative mass of the distal esophagus with enlarged distal para esophageal and celiac nodes worrisome for nodal metastases. 2.  The previously demonstrated lesion in the posterior segment of the right hepatic lobe has MR features which are at least moderately suspicious for a metastasis.  Consider tissue sampling. This may be difficult to biopsy percutaneously given its position in the dome of the right hepatic lobe.  Therefore, PET CT might prove helpful. 3.  Right lower lobe pulmonary nodule as seen on CT, also suspicious for a possible metastasis.   Original Report Authenticated By: Carey Bullocks, M.D.   Ir Fluoro Guide Cv Line Right  11/20/2012   *RADIOLOGY REPORT*  TUNNELED CENTRAL VENOUS CATHETER WITH SUBCUTANEOUS RESERVOIR (PORTACATH) PLACEMENT WITH ULTRASOUND AND FLUOROSCOPIC  GUIDANCE  Date: 11/20/2012  Clinical History: 79 year old male with recently diagnosed esophageal cancer in need of durable central venous access for chemotherapy.  Sedation: Moderate (conscious) sedation was administered during this procedure.  A total of threemg Versed and 15mg  Fentanyl were administered intravenously.  The patient's vital signs were monitored continuously by radiology nursing throughout the course of the procedure.  Total sedation time: 30 minutes  Fluoroscopy Time: 12 seconds  Procedure:  The right neck and chest was prepped with chlorhexidine, and draped in the usual sterile fashion using maximum barrier technique (cap and mask,  sterile gown, sterile gloves, large sterile sheet, hand hygiene and cutaneous antiseptic).  Antibiotic prophylaxis was provided with 2 g Ancef administered IV one hour prior to skin incision.  Local anesthesia was attained by infiltration with 1% lidocaine with epinephrine.  Ultrasound demonstrated patency of the right internal jugular vein, and this was documented with an image.  Under real-time ultrasound guidance, this vein was accessed with a 21 gauge micropuncture needle and image documentation was performed.  A small dermatotomy was made at the access site with an 11 scalpel.  A 0.018" wire was advanced into the SVC and the access needle exchanged for a 60F micropuncture vascular sheath.  The 0.018" wire was then removed  and a 0.035" wire advanced into the IVC.  An appropriate location for the subcutaneous reservoir was selected below the clavicle and an incision was made through the skin and underlying soft tissues.  The subcutaneous tissues were then dissected using a combination of blunt and sharp surgical technique and a pocket was formed.  A single lumen power injectable portacatheter was then tunneled through the subcutaneous tissues from the pocket to the dermatotomy and the port reservoir placed within the subcutaneous pocket.  The venous access site was then serially dilated and a peel away vascular sheath placed over the wire.  The wire was removed and the port catheter advanced into position under fluoroscopic guidance. The catheter tip is positioned in the superior cavoatrial junction. This was documented with a spot image. The portacatheter was then tested and found to flush and aspirate well.  The port was flushed with saline followed by 100 units/mL heparinized saline.  The pocket was then closed in two layers using first subdermal inverted interrupted absorbable sutures followed by a running subcuticular suture.  The epidermis was then sealed with Dermabond. The dermatotomy at the venous access  site was also closed with a single inverted subdermal suture and the epidermis sealed with Dermabond.  Complications:  None.  The patient tolerated the procedure well.  IMPRESSION:  Successful placement of a right IJapproach PowerPort with ultrasound and fluoroscopic guidance.  The catheter is ready for use.  Signed,  Sterling Big, MD Vascular & Interventional Radiologist [facility]   Original Report Authenticated By: Malachy Moan, M.D.   Ir US Guide Vasc Access Right  11/20/2012   *RADIOLOGY REPORT*  TUNNELED CENTRAL VENOUS CATHETER WITH SUBCUTANEOUS RESERVOIR (PORTACATH) PLACEMENT WITH ULTRASOUND AND FLUOROSCOPIC  GUIDANCE  Date: 11/20/2012  Clinical History: 79 year old male with recently diagnosed esophageal cancer in need of durable central venous access for chemotherapy.  Sedation: Moderate (conscious) sedation was administered during this procedure.  A total of threemg Versed and 15mg  Fentanyl were administered intravenously.  The patient's vital signs were monitored continuously by radiology nursing throughout the course of the procedure.  Total sedation time: 30 minutes  Fluoroscopy Time: 12 seconds  Procedure:  The right neck and chest was prepped with chlorhexidine, and draped in the usual sterile fashion using maximum barrier technique (cap and mask, sterile gown, sterile gloves, large sterile sheet, hand hygiene and cutaneous antiseptic).  Antibiotic prophylaxis was provided with 2 g Ancef administered IV one hour prior to skin incision.  Local anesthesia was attained by infiltration with 1% lidocaine with epinephrine.  Ultrasound demonstrated patency of the right internal jugular vein, and this was documented with an image.  Under real-time ultrasound guidance, this vein was accessed with a 21 gauge micropuncture needle and image documentation was performed.  A small dermatotomy was made at the access site with an 11 scalpel.  A 0.018" wire was advanced into the SVC and the  access needle exchanged for a 66F micropuncture vascular sheath.  The 0.018" wire was then removed and a 0.035" wire advanced into the IVC.  An appropriate location for the subcutaneous reservoir was selected below the clavicle and an incision was made through the skin and underlying soft tissues.  The subcutaneous tissues were then dissected using a combination of blunt and sharp surgical technique and a pocket was formed.  A single lumen power injectable portacatheter was then tunneled through the subcutaneous tissues from the pocket to the dermatotomy and the port reservoir placed within the subcutaneous pocket.  The  venous access site was then serially dilated and a peel away vascular sheath placed over the wire.  The wire was removed and the port catheter advanced into position under fluoroscopic guidance. The catheter tip is positioned in the superior cavoatrial junction. This was documented with a spot image. The portacatheter was then tested and found to flush and aspirate well.  The port was flushed with saline followed by 100 units/mL heparinized saline.  The pocket was then closed in two layers using first subdermal inverted interrupted absorbable sutures followed by a running subcuticular suture.  The epidermis was then sealed with Dermabond. The dermatotomy at the venous access site was also closed with a single inverted subdermal suture and the epidermis sealed with Dermabond.  Complications:  None.  The patient tolerated the procedure well.  IMPRESSION:  Successful placement of a right IJapproach PowerPort with ultrasound and fluoroscopic guidance.  The catheter is ready for use.  Signed,  Sterling Big, MD Vascular & Interventional Radiologist [facility]   Original Report Authenticated By: Malachy Moan, M.D.      Ct Chest W Contrast  11/13/2012   *RADIOLOGY REPORT*  Clinical Data:  Esophageal carcinoma.  CT CHEST, ABDOMEN AND PELVIS WITH CONTRAST  Technique:  Multidetector CT  imaging of the chest, abdomen and pelvis was performed following the standard protocol during bolus administration of intravenous contrast.  Contrast: OMNIPAQUE IOHEXOL 300 MG/ML  SOLN  Comparison:   None.  CT CHEST  Findings:  A bulky annular soft tissue mass is seen involving the distal thoracic esophagus which measures approximately 3.2 x 4.0 x 5.9 cm.  Para esophageal lymphadenopathy is seen within the posterior mediastinum both anterior and posterior to the descending thoracic aorta.  Largest lymph node measures 1.5 cm on image 33. Mediastinal lymphadenopathy also seen in the right paratracheal region measuring 2.0 x 2.8 cm on image 10. A 1.3 cm right hilar lymph node is seen with a small central focus of calcification. This could represent metastatic disease or partially calcified granulomatous disease.  No evidence of pleural or pericardial effusion. A 6 mm noncalcified pulmonary nodule is seen in the right lower lobe on image 29, which raises suspicion for pulmonary metastasis.  No other suspicious pulmonary nodules or masses are identified.  IMPRESSION:  1.  Bulky annular soft tissue mass involving the distal thoracic esophagus, consistent with known primary esophageal carcinoma. 2.  Metastatic lymphadenopathy in the posterior mediastinum and right paratracheal region.  Mild right hilar lymphadenopathy, possibly also secondary to metastatic disease. 3.  6 mm noncalcified right lower lobe pulmonary nodule.  Pulmonary metastasis cannot be excluded.  Consider PET CT scan for further evaluation  CT ABDOMEN AND PELVIS  Findings:  Upper abdominal lymphadenopathy is seen gastrohepatic ligament measuring 2.9 x 3.8 cm on image 47.  No other pathologically enlarged nodes are seen within the abdomen or pelvis.  A hypovascular lesion is seen in the posterior segment right hepatic lobe measuring 1.5 cm on image 37.  This is suspicious for a liver metastasis.  No other liver lesions are identified.  The  gallbladder, pancreas, spleen, adrenal glands, kidneys are normal in appearance.  No evidence of hydronephrosis.  No pelvic masses or lymphadenopathy identified.  No evidence of inflammatory process or abnormal fluid collections.  No suspicious bone lesions identified.  IMPRESSION:  1.  Metastatic upper abdominal lymphadenopathy in the gastrohepatic ligament. 2.  1.5 cm hypovascular lesion in the posterior segment of the right hepatic lobe, suspicious for liver metastasis.  Consider PET CT scan or abdomen MRI without and with contrast for further evaluation.   Original Report Authenticated By: Myles Rosenthal, M.D.   Ct Abdomen Pelvis W Contrast  11/13/2012   *RADIOLOGY REPORT*  Clinical Data:  Esophageal carcinoma.  CT CHEST, ABDOMEN AND PELVIS WITH CONTRAST  Technique:  Multidetector CT imaging of the chest, abdomen and pelvis was performed following the standard protocol during bolus administration of intravenous contrast.  Contrast: OMNIPAQUE IOHEXOL 300 MG/ML  SOLN  Comparison:   None.  CT CHEST  Findings:  A bulky annular soft tissue mass is seen involving the distal thoracic esophagus which measures approximately 3.2 x 4.0 x 5.9 cm.  Para esophageal lymphadenopathy is seen within the posterior mediastinum both anterior and posterior to the descending thoracic aorta.  Largest lymph node measures 1.5 cm on image 33. Mediastinal lymphadenopathy also seen in the right paratracheal region measuring 2.0 x 2.8 cm on image 10. A 1.3 cm right hilar lymph node is seen with a small central focus of calcification. This could represent metastatic disease or partially calcified granulomatous disease.  No evidence of pleural or pericardial effusion. A 6 mm noncalcified pulmonary nodule is seen in the right lower lobe on image 29, which raises suspicion for pulmonary metastasis.  No other suspicious pulmonary nodules or masses are identified.  IMPRESSION:  1.  Bulky annular soft tissue mass involving the distal thoracic  esophagus, consistent with known primary esophageal carcinoma. 2.  Metastatic lymphadenopathy in the posterior mediastinum and right paratracheal region.  Mild right hilar lymphadenopathy, possibly also secondary to metastatic disease. 3.  6 mm noncalcified right lower lobe pulmonary nodule.  Pulmonary metastasis cannot be excluded.  Consider PET CT scan for further evaluation  CT ABDOMEN AND PELVIS  Findings:  Upper abdominal lymphadenopathy is seen gastrohepatic ligament measuring 2.9 x 3.8 cm on image 47.  No other pathologically enlarged nodes are seen within the abdomen or pelvis.  A hypovascular lesion is seen in the posterior segment right hepatic lobe measuring 1.5 cm on image 37.  This is suspicious for a liver metastasis.  No other liver lesions are identified.  The gallbladder, pancreas, spleen, adrenal glands, kidneys are normal in appearance.  No evidence of hydronephrosis.  No pelvic masses or lymphadenopathy identified.  No evidence of inflammatory process or abnormal fluid collections.  No suspicious bone lesions identified.  IMPRESSION:  1.  Metastatic upper abdominal lymphadenopathy in the gastrohepatic ligament. 2.  1.5 cm hypovascular lesion in the posterior segment of the right hepatic lobe, suspicious for liver metastasis.  Consider PET CT scan or abdomen MRI without and with contrast for further evaluation.   Original Report Authenticated By: Myles Rosenthal, M.D.   Ir Gastrostomy Tube Mod Sed  11/17/2012   *RADIOLOGY REPORT*  Indication:  Esophageal mass, unable to place gastrostomy tube via endoscopic approach  "PUSH" PERCUTANEOUS GASTOSTOMY TUBE PLACEMENT  Comparison: CT abdomen pelvis - 11/13/2012; abdominal MRI - earlier same day  Medications:  Versed 3 mg IV; Fentanyl 100 mcg IV; Glucagon 1 gm IV; Ancef 2 grams IV; Antibiotics were administered within 1 hour of the procedure.  Contrast volume:  25 mL Omnipaque-300 administered into the gastric lumen  Sedation time: 45 minutes  Fluoroscopy  time: 9 minutes, 6 seconds  Complications: None immediate  PROCEDURE/FINDINGS:  Informed written consent was obtained from the patient and the patient's daughter via the use of a Medical translator following explanation of the procedure, risks, benefits and alternatives.  A time out  was performed prior to the initiation of the procedure. Ultrasound scanning was performed to demarcate the edge of the left lobe of the liver.  Maximal barrier sterile technique utilized including caps, mask, sterile gowns, sterile gloves, large sterile drape, hand hygiene and Betadine prep.  The left upper quadrant was sterilely prepped and draped.  An oral gastric catheter was inserted into the stomach under fluoroscopy. The left costal margin and air opacified transverse colon were identified and avoided.  Air was injected into the stomach for insufflation and visualization under fluoroscopy.  Under sterile conditions 3 T-tacts were deployed to pexy the stomach to the anterior abdominal wall percutaneously beneath the left subcostal margin after the overlying soft tissues were anesthetized with 1% Lidocaine with epinephrine.  Each needle position was confirmed within the stomach with aspiration of air and injection of small amount of contrast.  Multiple spot fluoroscopic images were obtained to confirm appropriate intraluminal positioning.  An incision was made between the T tacks and an 18 gauge needle was utilized to access the stomach.  Again, a small amount of air was aspirated and a small amount of contrast was injected confirming appropriate intraluminal positioning.  Over an Amplatz guide wire, the access needle was exchanged for a Kumpe catheter.   With the use of a stiff Glidewire, the Kumpe catheter was manipulated into the descending portion of the duodenum.  Contrast injection confirmed appropriate positioning.  Under intermittent fluoroscopic guidance, Kumpe catheter was exchanged for the telescoping peel away sheath.   This ultimately allowed placement of e 20-French balloon retention gastrostomy tube.   The balloon was insufflated within the gastric lumen and pulled taut against the ventral wall of the stomach.  Contrast injection confirms appropriate positioning within the stomach. Several spot radiographic images were obtained in various obliquities for documentation.  The patient tolerated procedure well without immediate post procedural complication.  IMPRESSION:  Successful fluoroscopic insertion of an 18-French "push" gastrostomy.   Original Report Authenticated By: Tacey Ruiz, MD   Dg Esophagus  11/21/2012   *RADIOLOGY REPORT*  Clinical Data:Known esophageal carcinoma, evaluate for tracheoesophageal fistula  ESOPHAGUS/BARIUM SWALLOW/TABLET STUDY  Fluoroscopy Time: 0 minutes 51 seconds  Comparison: CT thorax 11/13/2012  Findings: Normal esophageal motility.  There is irregularity of the esophageal lumen and mucosa involving the junction of the mid to distal esophagus.  Contrast remains appropriately contained within the esophagus with no evidence of tracheoesophageal fistula or perforation.  This is a limited study as the patient can only tolerate small boluses.  This is sufficient to exclude tracheoesophageal fistula that detailed evaluation of the esophagus is otherwise not obtained.  IMPRESSION: No evidence of tracheoesophageal fistula.   Original Report Authenticated By: Esperanza Heir, M.D.   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 Liver lesion on CT.  MRI ABDOMEN WITHOUT AND WITH CONTRAST  Technique:  Multiplanar multisequence MR imaging of the abdomen was performed both before and after the administration of intravenous contrast.  Contrast: 12mL MULTIHANCE GADOBENATE DIMEGLUMINE 529 MG/ML IV SOLN  Comparison:  CTs of the chest, abdomen and pelvis 11/13/2012.  Findings:  Study is mildly motion degraded,  especially on the postcontrast images.  The previously demonstrated lesion within the posterior dome of the right hepatic lobe measures 1.4 cm in diameter.  This lesion demonstrates homogeneous low T1 and heterogeneous T2 signal.  There is associated restricted diffusion. Postcontrast, this lesion demonstrates  peripheral enhancement which appears continuous.  This is at least moderately suspicious for a metastasis.  No other liver lesions are identified.  The previously demonstrated infiltrative mass involving the distal esophagus appears grossly unchanged.  Adjacent distal para esophageal and celiac lymph nodes are unchanged, worrisome for metastatic disease.  There is a right lower lobe pulmonary nodule which was seen on the CT.  The spleen, adrenal glands, gallbladder, pancreas and kidneys appear normal.  There are no worrisome osseous findings.  IMPRESSION:  1.  Again demonstrated are an infiltrative mass of the distal esophagus with enlarged distal para esophageal and celiac nodes worrisome for nodal metastases. 2.  The previously demonstrated lesion in the posterior segment of the right hepatic lobe has MR features which are at least moderately suspicious for a metastasis.  Consider tissue sampling. This may be difficult to biopsy percutaneously given its position in the dome of the right hepatic lobe.  Therefore, PET CT might prove helpful. 3.  Right lower lobe pulmonary nodule as seen on CT, also suspicious for a possible metastasis.   Original Report Authenticated By: Carey Bullocks, M.D.   Ir Fluoro Guide Cv Line Right  11/20/2012   *RADIOLOGY REPORT*  TUNNELED CENTRAL VENOUS CATHETER WITH SUBCUTANEOUS RESERVOIR (PORTACATH) PLACEMENT WITH ULTRASOUND AND FLUOROSCOPIC  GUIDANCE  Date: 11/20/2012  Clinical History: 79 year old male with recently diagnosed esophageal cancer in need of durable central venous access for chemotherapy.  Sedation: Moderate (conscious) sedation was administered during this  procedure.  A total of threemg Versed and 15mg  Fentanyl were administered intravenously.  The patient's vital signs were monitored continuously by radiology nursing throughout the course of the procedure.  Total sedation time: 30 minutes  Fluoroscopy Time: 12 seconds  Procedure:  The right neck and chest was prepped with chlorhexidine, and draped in the usual sterile fashion using maximum barrier technique (cap and mask, sterile gown, sterile gloves, large sterile sheet, hand hygiene and cutaneous antiseptic).  Antibiotic prophylaxis was provided with 2 g Ancef administered IV one hour prior to skin incision.  Local anesthesia was attained by infiltration with 1% lidocaine with epinephrine.  Ultrasound demonstrated patency of the right internal jugular vein, and this was documented with an image.  Under real-time ultrasound guidance, this vein was accessed with a 21 gauge micropuncture needle and image documentation was performed.  A small dermatotomy was made at the access site with an 11 scalpel.  A 0.018" wire was advanced into the SVC and the access needle exchanged for a 35F micropuncture vascular sheath.  The 0.018" wire was then removed and a 0.035" wire advanced into the IVC.  An appropriate location for the subcutaneous reservoir was selected below the clavicle and an incision was made through the skin and underlying soft tissues.  The subcutaneous tissues were then dissected using a combination of blunt and sharp surgical technique and a pocket was formed.  A single lumen power injectable portacatheter was then tunneled through the subcutaneous tissues from the pocket to the dermatotomy and the port reservoir placed within the subcutaneous pocket.  The venous access site was then serially dilated and a peel away vascular sheath placed over the wire.  The wire was removed and the port catheter advanced into position under fluoroscopic guidance. The catheter tip is positioned in the superior cavoatrial  junction. This was documented with a spot image. The portacatheter was then tested and found to flush and aspirate well.  The port was flushed with saline followed by 100 units/mL heparinized saline.  The pocket was then closed in two layers using first subdermal inverted interrupted absorbable sutures followed by a running subcuticular suture.  The epidermis was then sealed with Dermabond. The dermatotomy at the venous access site was also closed with a single inverted subdermal suture and the epidermis sealed with Dermabond.  Complications:  None.  The patient tolerated the procedure well.  IMPRESSION:  Successful placement of a right IJapproach PowerPort with ultrasound and fluoroscopic guidance.  The catheter is ready for use.  Signed,  Sterling Big, MD Vascular & Interventional Radiologist [facility]iology   Original Report Authenticated By: Malachy Moan, M.D.   Ir US Guide Vasc Access Right  11/20/2012   *RADIOLOGY REPORT*  TUNNELED CENTRAL VENOUS CATHETER WITH SUBCUTANEOUS RESERVOIR (PORTACATH) PLACEMENT WITH ULTRASOUND AND FLUOROSCOPIC  GUIDANCE  Date: 11/20/2012  Clinical History: 79 year old male with recently diagnosed esophageal cancer in need of durable central venous access for chemotherapy.  Sedation: Moderate (conscious) sedation was administered during this procedure.  A total of threemg Versed and 15mg  Fentanyl were administered intravenously.  The patient's vital signs were monitored continuously by radiology nursing throughout the course of the procedure.  Total sedation time: 30 minutes  Fluoroscopy Time: 12 seconds  Procedure:  The right neck and chest was prepped with chlorhexidine, and draped in the usual sterile fashion using maximum barrier technique (cap and mask, sterile gown, sterile gloves, large sterile sheet, hand hygiene and cutaneous antiseptic).  Antibiotic prophylaxis was provided with 2 g Ancef administered IV one hour prior to skin incision.  Local anesthesia  was attained by infiltration with 1% lidocaine with epinephrine.  Ultrasound demonstrated patency of the right internal jugular vein, and this was documented with an image.  Under real-time ultrasound guidance, this vein was accessed with a 21 gauge micropuncture needle and image documentation was performed.  A small dermatotomy was made at the access site with an 11 scalpel.  A 0.018" wire was advanced into the SVC and the access needle exchanged for a 91F micropuncture vascular sheath.  The 0.018" wire was then removed and a 0.035" wire advanced into the IVC.  An appropriate location for the subcutaneous reservoir was selected below the clavicle and an incision was made through the skin and underlying soft tissues.  The subcutaneous tissues were then dissected using a combination of blunt and sharp surgical technique and a pocket was formed.  A single lumen power injectable portacatheter was then tunneled through the subcutaneous tissues from the pocket to the dermatotomy and the port reservoir placed within the subcutaneous pocket.  The venous access site was then serially dilated and a peel away vascular sheath placed over the wire.  The wire was removed and the port catheter advanced into position under fluoroscopic guidance. The catheter tip is positioned in the superior cavoatrial junction. This was documented with a spot image. The portacatheter was then tested and found to flush and aspirate well.  The port was flushed with saline followed by 100 units/mL heparinized saline.  The pocket was then closed in two layers using first subdermal inverted interrupted absorbable sutures followed by a running subcuticular suture.  The epidermis was then sealed with Dermabond. The dermatotomy at the venous access site was also closed with a single inverted subdermal suture and the epidermis sealed with Dermabond.  Complications:  None.  The patient tolerated the procedure well.  IMPRESSION:  Successful placement of a  right IJapproach PowerPort with ultrasound and fluoroscopic guidance.  The catheter is ready for use.  Signed,  Sterling Big, MD Vascular & Interventional Radiologist [facility] Radiology   Original Report Authenticated By: Malachy Moan, M.D.    Assessment and Plan:   [name] is 48 old male with locally advanced SCC of the esophagus; however, CT scan and MRI showing single liver lesion consering for metastatic disease.   Wwill be receiving systemic chemotherapy.  Today in clinic with Alejandro Mulling we had a along discussion about his disease, diagnosis, clinical status and clinic stage. We explained the disease for him that we will need further evaluation of the liver to determine if it is malignant or not. Will arrange for PET scan any biopsy of the liver lesion. Additionally we went over the treatment plan and informed patient that If locally advanced, will plan for concurrent chemo-rad first then evaluation for surgical resection. However, if metastatic diease, he be receiving chemotherapy with palliative goal. We also went over the side of effect of treatment.   In summary   -We stressed the need for nutritional support and pain control. -Liver biopsy and PET scan (if liver biopsy is negative)  -Refer him to Radiation and surgical oncology team -Plan for systemic chemo vs chemo-Rad according to the final stage.  -F/u in the clinic in 2 weeks  Patient asked a series of questions which was answered hopefully to his staisfication.  The length of time of the face-to-face encounter was 60 minutes. More than 50% of time was spent counseling and coordination of care.  Zachery Dakins, MD 11/25/2012 5:36 PM

## 2012-11-25 NOTE — Telephone Encounter (Signed)
gv and printed appt shced for pt...faxed referral to [facility]...faxed pt sched to SNOW the interpteter she will cotnact pt to confirm appts.Marland KitchenMarland KitchenMarland Kitchen

## 2012-11-25 NOTE — Progress Notes (Signed)
Checked in new patient with no insurance. He goes for medicaid on 12/09/12. He is with son and I told him to bring card back and/when approved, so they can get billed. Ph and mail only for communication. Didn't ask if living will/POA, the patient doesn't speak english-son interepts.  He has no PCP

## 2012-11-27 ENCOUNTER — Other Ambulatory Visit: Payer: Self-pay | Admitting: Radiology

## 2012-11-30 NOTE — Progress Notes (Signed)
Thoracic Location of Tumor / Histology: Esophagus- Squamous Cell Carcinoma  Patient presented 3 month history of dysphagia, initially to solids progressing to liquids with subsequent loss of 25 lbs.  Regurgitation  Biopsies of Esophagus (if applicable) revealed: Squamous cell carcinoma.  Ct shows "infiltrative mass of the distal esophagus with enlarged distal para esophageal celiac nodes" and "right lower lobe pulmonary nodule."  Tobacco/Marijuana/Snuff/ETOH use: Never smoked, no alcohol, no drugs  Past/Anticipated interventions by cardiothoracic surgery, if any: None  Past/Anticipated interventions by medical oncology, if any: chemotherapy  Signs/Symptoms  Weight changes, if any: 25 lbs  Respiratory complaints, if any: no Hemoptysis, if AVW:[id number] blood coughed up with mucous Pain issues, if any: at peg site, soreness, throat soreness, cannot swallow foods,even water SAFETY ISSUES:  Prior radiation? No  Pacemaker/ICD? No  Possible current pregnancy? No  Is the patient on methotrexate? No  Current Complaints / other details:  CT demonstrates lesion in the posterior segment of the right hepatic lobe. And upper abdominal lymphadenopathy.  J-tube placement

## 2012-12-01 ENCOUNTER — Ambulatory Visit
Admission: RE | Admit: 2012-12-01 | Discharge: 2012-12-01 | Disposition: A | Discharge status: Home / Self Care | Payer: Self-pay | Source: Ambulatory Visit | Attending: Radiation Oncology | Admitting: Radiation Oncology

## 2012-12-01 ENCOUNTER — Institutional Professional Consult (permissible substitution) (INDEPENDENT_AMBULATORY_CARE_PROVIDER_SITE_OTHER): Payer: Self-pay | Admitting: Cardiothoracic Surgery

## 2012-12-01 ENCOUNTER — Encounter (HOSPITAL_COMMUNITY)
Admission: RE | Admit: 2012-12-01 | Discharge: 2012-12-01 | Disposition: A | Discharge status: Home / Self Care | Payer: Self-pay | Source: Ambulatory Visit | Attending: Hematology and Oncology | Admitting: Hematology and Oncology

## 2012-12-01 ENCOUNTER — Encounter: Payer: Self-pay | Admitting: Radiation Oncology

## 2012-12-01 ENCOUNTER — Ambulatory Visit: Payer: Self-pay | Admitting: Nutrition

## 2012-12-01 ENCOUNTER — Telehealth: Payer: Self-pay | Admitting: Hematology and Oncology

## 2012-12-01 ENCOUNTER — Encounter: Payer: Self-pay | Admitting: Cardiothoracic Surgery

## 2012-12-01 VITALS — BP 128/79 | HR 87 | Temp 98.4°F | Resp 20 | Ht 60.0 in | Wt 121.6 lb

## 2012-12-01 VITALS — BP 156/87 | HR 82 | Resp 16 | Ht 62.0 in | Wt 118.0 lb

## 2012-12-01 DIAGNOSIS — K769 Liver disease, unspecified: Secondary | ICD-10-CM

## 2012-12-01 DIAGNOSIS — C155 Malignant neoplasm of lower third of esophagus: Secondary | ICD-10-CM

## 2012-12-01 DIAGNOSIS — R911 Solitary pulmonary nodule: Secondary | ICD-10-CM | POA: Insufficient documentation

## 2012-12-01 DIAGNOSIS — C159 Malignant neoplasm of esophagus, unspecified: Secondary | ICD-10-CM | POA: Insufficient documentation

## 2012-12-01 DIAGNOSIS — Z79899 Other long term (current) drug therapy: Secondary | ICD-10-CM | POA: Insufficient documentation

## 2012-12-01 DIAGNOSIS — R590 Localized enlarged lymph nodes: Secondary | ICD-10-CM

## 2012-12-01 DIAGNOSIS — C778 Secondary and unspecified malignant neoplasm of lymph nodes of multiple regions: Secondary | ICD-10-CM | POA: Insufficient documentation

## 2012-12-01 DIAGNOSIS — K7689 Other specified diseases of liver: Secondary | ICD-10-CM

## 2012-12-01 DIAGNOSIS — R599 Enlarged lymph nodes, unspecified: Secondary | ICD-10-CM

## 2012-12-01 DIAGNOSIS — C787 Secondary malignant neoplasm of liver and intrahepatic bile duct: Secondary | ICD-10-CM | POA: Insufficient documentation

## 2012-12-01 DIAGNOSIS — R1310 Dysphagia, unspecified: Secondary | ICD-10-CM | POA: Insufficient documentation

## 2012-12-01 DIAGNOSIS — C771 Secondary and unspecified malignant neoplasm of intrathoracic lymph nodes: Secondary | ICD-10-CM | POA: Insufficient documentation

## 2012-12-01 LAB — GLUCOSE, CAPILLARY: Glucose-Capillary: 97 mg/dL (ref 70–99)

## 2012-12-01 MED ORDER — MORPHINE SULFATE 10 MG/5ML PO SOLN: 5.0000 mg | ORAL | Status: DC | PRN

## 2012-12-01 MED ORDER — FENTANYL 25 MCG/HR TD PT72: 1.0000 | MEDICATED_PATCH | TRANSDERMAL | Status: DC

## 2012-12-01 MED ORDER — POLYETHYLENE GLYCOL 3350 17 GM/SCOOP PO POWD: 17.0000 g | Freq: Every day | ORAL | Status: DC

## 2012-12-01 MED ORDER — FLUDEOXYGLUCOSE F - 18 (FDG) INJECTION
18.5000 | Freq: Once | INTRAVENOUS | Status: AC | PRN
  Administered 2012-12-01: 18.5 via INTRAVENOUS

## 2012-12-01 NOTE — Progress Notes (Signed)
[address]       [place] [address]             [phone]                    [facility] Medical Record #[id number] Date of Birth: 1929/10/01  Referring: Zachery Dakins, MD Primary Care: No PCP Per Patient  Chief Complaint:    Chief Complaint  Patient presents with  . Esophageal Cancer    Surgical eval,  PET Scan 12/01/12 CT BX 11/13/12  . Lung Lesion    History of Present Illness:     patient is referred by medical oncology for consideration of surgical esophagectomy. The patient is a [place] gentleman 79 years of age. He has had several month history of increasing pain and difficulty swallowing and weight loss. The difficulty at the come to the point where he was unable to take liquids. A feeding gastrostomy tube has been placed. He has not started any definitive treatment at this point.      Current Activity/ Functional Status:  Patient will not be independent with mobility/ambulation, transfers, ADL's, IADL's.  Zubrod Score: At the time of surgery this patient's most appropriate activity status/level should be described as: []  Normal activity, no symptoms []  Symptoms, fully ambulatory []  Symptoms, in bed less than or equal to 50% of the time [x]  Symptoms, in bed greater than 50% of the time but less than 100% []  Bedridden []  Moribund   Past Medical History  Diagnosis Date  . GERD (gastroesophageal reflux disease) No knownn hx but, pt reports symptoms of mid sternal pain at xiphoid that is irriated when eating.  . Esophageal cancer 11/13/2012    Past Surgical History  Procedure Laterality Date  . Esophagogastroduodenoscopy (egd) with esophageal dilation N/A 11/13/2012    Procedure: ESOPHAGOGASTRODUODENOSCOPY (EGD) WITH ESOPHAGEAL DILATION;  Surgeon: Rachael Fee, MD;  Location: [facility] ENDOSCOPY;  Service: Endoscopy;  Laterality: N/A;  . Savory dilation N/A 11/13/2012    Procedure: SAVORY DILATION;  Surgeon: Rachael Fee, MD;   Location: [facility] ENDOSCOPY;  Service: Endoscopy;  Laterality: N/A;    No family history on file.  History   Social History  . Marital Status: Married    Spouse Name: N/A    Number of Children: N/A  . Years of Education: N/A   Occupational History  . Not on file.   Social History Main Topics  . Smoking status: Never Smoker   . Smokeless tobacco: Not on file  . Alcohol Use: No  . Drug Use: No         History  Smoking status  . Never Smoker   Smokeless tobacco  . Not on file    History  Alcohol Use No     No Known Allergies  Current Outpatient Prescriptions  Medication Sig Dispense Refill  . bisacodyl (DULCOLAX) 10 MG suppository Place 1 suppository (10 mg total) rectally as needed for constipation.  50 suppository  3  . docusate (COLACE) 50 MG/5ML liquid Take 10 mLs (100 mg total) by mouth 2 (two) times daily.  480 mL  3  . feeding supplement (PRO-STAT SUGAR FREE 64) LIQD Place 30 mLs into feeding tube 2 (two) times daily.  900 mL  30  . fentaNYL (DURAGESIC - DOSED MCG/HR) 25 MCG/HR Place 1 patch (25 mcg total) onto the skin every 3 (three) days.  5 patch  0  . morphine 10 MG/5ML  solution Place 2.5 mLs (5 mg total) into feeding tube every 4 (four) hours as needed for pain.  500 mL  0  . Nutritional Supplements (FEEDING SUPPLEMENT, OSMOLITE 1.2 CAL,) LIQD Place 355 mLs into feeding tube 4 (four) times daily.  1500 mL  30  . omeprazole (PRILOSEC) 2 mg/mL SUSP Place 10 mLs (20 mg total) into feeding tube daily.  100 mL  3  . OVER THE COUNTER MEDICATION Take 1 tablet by mouth once. OTC med for ulcer      . polyethylene glycol powder (MIRALAX) powder Take 17 g by mouth daily. Follow instructions on container, mix with water and inject in tube. Use twice daily until bowel movement, then daily to prevent constipation.  255 g  5  . Probiotic Product (PROBIOTIC DAILY PO) Take 1 tablet by mouth once.      . Water For Irrigation, Sterile (FREE WATER) SOLN Place 200 mLs into feeding  tube daily.      . Water For Irrigation, Sterile (FREE WATER) SOLN Use 60ml flush before and after each bolus feeding       No current facility-administered medications for this visit.       Review of Systems:     Cardiac Review of Systems: Y or N  Chest Pain [  y  ]  Resting SOB [ n  ] Exertional SOB  [ n ]  Orthopnea [  n]   Pedal Edema [ n  ]    Palpitations [ n ] Syncope  [n  ]   Presyncope [ n ]  General Review of Systems: [Y] = yes [  ]=no Constitional: recent weight change [ y ]; anorexia [  ]; fatigue [  ]; nausea [  ]; night sweats [  ]; fever [  ]; or chills [  ];                                                                                                                                          Dental: poor dentition[y  ]; Last Dentist visit:   Eye : blurred vision [  ]; diplopia [   ]; vision changes [  ];  Amaurosis fugax[  ]; Resp: cough [  ];  wheezing[ n ];  hemoptysis[  ]; shortness of breath[ n ]; paroxysmal nocturnal dyspnea[  ]; dyspnea on exertion[  ]; or orthopnea[  ];  GI:  gallstones[  ], vomiting[  ];  dysphagia[  ]; melena[  ];  hematochezia [  ]; heartburn[  ];   Hx of  Colonoscopy[  ]; GU: kidney stones [  ]; hematuria[  ];   dysuria [  ];  nocturia[  ];  history of     obstruction [  ]; urinary frequency [  ]             Skin: rash, swelling[  ];,  hair loss[  ];  peripheral edema[  ];  or itching[  ]; Musculosketetal: myalgias[  ];  joint swelling[  ];  joint erythema[  ];  joint pain[  ];  back pain[  ];  Heme/Lymph: bruising[  ];  bleeding[  ];  anemia[  ];  Neuro: TIA[  ];  headaches[  ];  stroke[  ];  vertigo[  ];  seizures[  ];   paresthesias[  ];  difficulty walking[  ];  Psych:depression[  ]; anxiety[  ];  Endocrine: diabetes[  ];  thyroid dysfunction[  ];  Immunizations: Flu [? ]; Pneumococcal[?  ];  Other:  Physical Exam: BP 156/87  Pulse 82  Resp 16  Ht 5\' 2"  (1.575 m)  Wt 118 lb (53.524 kg)  BMI 21.58 kg/m2  SpO2 99%  General  appearance: alert, cooperative, appears stated age, cachectic, mild distress and slowed mentation Neurologic: intact Heart: regular rate and rhythm, S1, S2 normal, no murmur, click, rub or gallop Lungs: clear to auscultation bilaterally Abdomen: The patient has no palpable masses in the abdomen he does have a newly placed feeding j gastrostomy tube in place Extremities: extremities normal, atraumatic, no cyanosis or edema and Homans sign is negative, no sign of DVT Wound: A freshly placed right IJ Port-A-Cath is also in place I do not appreciate any cervical or supraclavicular or axillary adenopathy, no is known to be present CT scan  Diagnostic Studies & Laboratory data:     Recent Radiology Findings:  Nm Pet Image Initial (pi) Skull Base To Thigh  12/01/2012   *RADIOLOGY REPORT*  Clinical Data: Initial treatment strategy for esophageal cancer.  NUCLEAR MEDICINE PET SKULL BASE TO THIGH  Fasting Blood Glucose:  97  Technique:  18.5 mCi F-18 FDG was injected intravenously. CT data was obtained and used for attenuation correction and anatomic localization only.  (This was not acquired as a diagnostic CT examination.) Additional exam technical data entered on technologist worksheet.  Comparison:  11/13/2012  Findings:  Neck: No hypermetabolic lymph nodes in the neck.  Chest:  Circumferential tumor involving the distal half of the thoracic esophagus is identified.  This measures approximately 10.3 cm in length originating just below the level of the carina and extending to the level of the GE junction.  The SUV max associated this tumor is equal to 32.7.  Hypermetabolic right paratracheal lymph node is identified.  This measures 1.9 cm and has an SUV max equal to 16.1, image 46.  Abdomen/Pelvis:  There is a low attenuation lesion within the right hepatic lobe measuring 2.2 cm.  Associated increased FDG uptake is identified with an SUV max equal to 8.4, image 84.  Hypermetabolic upper abdominal adenopathy is  identified.  In the gastrohepatic ligament region there is a lymph node mass measuring approximately 4.5 x 3.9 cm, image 102.  This has an SUV max equal to 21.4, image 102.  Hypermetabolic portacaval lymph node is also noted. No evidence for hypermetabolic metastasis to the pelvis.  Skeleton:  No focal hypermetabolic activity to suggest skeletal metastasis.  IMPRESSION:  1.  Examination is positive for intensely hypermetabolic tumor involving the distal esophagus. 2.  Hypermetabolic right paratracheal lymph node metastasis. 3.  Hypermetabolic lymph node metastasis to the gastrohepatic ligament region and portocaval lymph node region. 4.  Hypermetabolic liver metastasis.   Original Report Authenticated By: Signa Kell, M.D.   Ct Chest W Contrast  11/13/2012   *RADIOLOGY REPORT*  Clinical Data:  Esophageal carcinoma.  CT CHEST,  ABDOMEN AND PELVIS WITH CONTRAST  Technique:  Multidetector CT imaging of the chest, abdomen and pelvis was performed following the standard protocol during bolus administration of intravenous contrast.  Contrast: OMNIPAQUE IOHEXOL 300 MG/ML  SOLN  Comparison:   None.  CT CHEST  Findings:  A bulky annular soft tissue mass is seen involving the distal thoracic esophagus which measures approximately 3.2 x 4.0 x 5.9 cm.  Para esophageal lymphadenopathy is seen within the posterior mediastinum both anterior and posterior to the descending thoracic aorta.  Largest lymph node measures 1.5 cm on image 33. Mediastinal lymphadenopathy also seen in the right paratracheal region measuring 2.0 x 2.8 cm on image 10. A 1.3 cm right hilar lymph node is seen with a small central focus of calcification. This could represent metastatic disease or partially calcified granulomatous disease.  No evidence of pleural or pericardial effusion. A 6 mm noncalcified pulmonary nodule is seen in the right lower lobe on image 29, which raises suspicion for pulmonary metastasis.  No other suspicious pulmonary  nodules or masses are identified.  IMPRESSION:  1.  Bulky annular soft tissue mass involving the distal thoracic esophagus, consistent with known primary esophageal carcinoma. 2.  Metastatic lymphadenopathy in the posterior mediastinum and right paratracheal region.  Mild right hilar lymphadenopathy, possibly also secondary to metastatic disease. 3.  6 mm noncalcified right lower lobe pulmonary nodule.  Pulmonary metastasis cannot be excluded.  Consider PET CT scan for further evaluation  CT ABDOMEN AND PELVIS  Findings:  Upper abdominal lymphadenopathy is seen gastrohepatic ligament measuring 2.9 x 3.8 cm on image 47.  No other pathologically enlarged nodes are seen within the abdomen or pelvis.  A hypovascular lesion is seen in the posterior segment right hepatic lobe measuring 1.5 cm on image 37.  This is suspicious for a liver metastasis.  No other liver lesions are identified.  The gallbladder, pancreas, spleen, adrenal glands, kidneys are normal in appearance.  No evidence of hydronephrosis.  No pelvic masses or lymphadenopathy identified.  No evidence of inflammatory process or abnormal fluid collections.  No suspicious bone lesions identified.  IMPRESSION:  1.  Metastatic upper abdominal lymphadenopathy in the gastrohepatic ligament. 2.  1.5 cm hypovascular lesion in the posterior segment of the right hepatic lobe, suspicious for liver metastasis.  Consider PET CT scan or abdomen MRI without and with contrast for further evaluation.   Original Report Authenticated By: Myles Rosenthal, M.D.    Ir Gastrostomy Tube Mod Sed  11/17/2012   *RADIOLOGY REPORT*  Indication:  Esophageal mass, unable to place gastrostomy tube via endoscopic approach  "PUSH" PERCUTANEOUS GASTOSTOMY TUBE PLACEMENT  Comparison: CT abdomen pelvis - 11/13/2012; abdominal MRI - earlier same day  Medications:  Versed 3 mg IV; Fentanyl 100 mcg IV; Glucagon 1 gm IV; Ancef 2 grams IV; Antibiotics were administered within 1 hour of the procedure.   Contrast volume:  25 mL Omnipaque-300 administered into the gastric lumen  Sedation time: 45 minutes  Fluoroscopy time: 9 minutes, 6 seconds  Complications: None immediate  PROCEDURE/FINDINGS:  Informed written consent was obtained from the patient and the patient's daughter via the use of a Medical translator following explanation of the procedure, risks, benefits and alternatives.  A time out was performed prior to the initiation of the procedure. Ultrasound scanning was performed to demarcate the edge of the left lobe of the liver.  Maximal barrier sterile technique utilized including caps, mask, sterile gowns, sterile gloves, large sterile drape, hand hygiene and Betadine  prep.  The left upper quadrant was sterilely prepped and draped.  An oral gastric catheter was inserted into the stomach under fluoroscopy. The left costal margin and air opacified transverse colon were identified and avoided.  Air was injected into the stomach for insufflation and visualization under fluoroscopy.  Under sterile conditions 3 T-tacts were deployed to pexy the stomach to the anterior abdominal wall percutaneously beneath the left subcostal margin after the overlying soft tissues were anesthetized with 1% Lidocaine with epinephrine.  Each needle position was confirmed within the stomach with aspiration of air and injection of small amount of contrast.  Multiple spot fluoroscopic images were obtained to confirm appropriate intraluminal positioning.  An incision was made between the T tacks and an 18 gauge needle was utilized to access the stomach.  Again, a small amount of air was aspirated and a small amount of contrast was injected confirming appropriate intraluminal positioning.  Over an Amplatz guide wire, the access needle was exchanged for a Kumpe catheter.   With the use of a stiff Glidewire, the Kumpe catheter was manipulated into the descending portion of the duodenum.  Contrast injection confirmed appropriate positioning.   Under intermittent fluoroscopic guidance, Kumpe catheter was exchanged for the telescoping peel away sheath.  This ultimately allowed placement of e 20-French balloon retention gastrostomy tube.   The balloon was insufflated within the gastric lumen and pulled taut against the ventral wall of the stomach.  Contrast injection confirms appropriate positioning within the stomach. Several spot radiographic images were obtained in various obliquities for documentation.  The patient tolerated procedure well without immediate post procedural complication.  IMPRESSION:  Successful fluoroscopic insertion of an 18-French "push" gastrostomy.   Original Report Authenticated By: Tacey Ruiz, MD   Dg Esophagus  11/21/2012   *RADIOLOGY REPORT*  Clinical Data:Known esophageal carcinoma, evaluate for tracheoesophageal fistula  ESOPHAGUS/BARIUM SWALLOW/TABLET STUDY  Fluoroscopy Time: 0 minutes 51 seconds  Comparison: CT thorax 11/13/2012  Findings: Normal esophageal motility.  There is irregularity of the esophageal lumen and mucosa involving the junction of the mid to distal esophagus.  Contrast remains appropriately contained within the esophagus with no evidence of tracheoesophageal fistula or perforation.  This is a limited study as the patient can only tolerate small boluses.  This is sufficient to exclude tracheoesophageal fistula that detailed evaluation of the esophagus is otherwise not obtained.  IMPRESSION: No evidence of tracheoesophageal fistula.   Original Report Authenticated By: Esperanza Heir, M.D.   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 Liver lesion on CT.  MRI ABDOMEN WITHOUT AND WITH CONTRAST  Technique:  Multiplanar multisequence MR imaging of the abdomen was performed both before and after the administration of intravenous contrast.  Contrast: 12mL MULTIHANCE GADOBENATE DIMEGLUMINE 529 MG/ML IV  SOLN  Comparison:  CTs of the chest, abdomen and pelvis 11/13/2012.  Findings:  Study is mildly motion degraded, especially on the postcontrast images.  The previously demonstrated lesion within the posterior dome of the right hepatic lobe measures 1.4 cm in diameter.  This lesion demonstrates homogeneous low T1 and heterogeneous T2 signal.  There is associated restricted diffusion. Postcontrast, this lesion demonstrates peripheral enhancement which appears continuous.  This is at least moderately suspicious for a metastasis.  No other liver lesions are identified.  The previously demonstrated infiltrative mass involving the distal esophagus appears grossly unchanged.  Adjacent distal para esophageal and celiac lymph nodes  are unchanged, worrisome for metastatic disease.  There is a right lower lobe pulmonary nodule which was seen on the CT.  The spleen, adrenal glands, gallbladder, pancreas and kidneys appear normal.  There are no worrisome osseous findings.  IMPRESSION:  1.  Again demonstrated are an infiltrative mass of the distal esophagus with enlarged distal para esophageal and celiac nodes worrisome for nodal metastases. 2.  The previously demonstrated lesion in the posterior segment of the right hepatic lobe has MR features which are at least moderately suspicious for a metastasis.  Consider tissue sampling. This may be difficult to biopsy percutaneously given its position in the dome of the right hepatic lobe.  Therefore, PET CT might prove helpful. 3.  Right lower lobe pulmonary nodule as seen on CT, also suspicious for a possible metastasis.   Original Report Authenticated By: Carey Bullocks, M.D.   Ir Fluoro Guide Cv Line Right  11/20/2012   *RADIOLOGY REPORT*  TUNNELED CENTRAL VENOUS CATHETER WITH SUBCUTANEOUS RESERVOIR (PORTACATH) PLACEMENT WITH ULTRASOUND AND FLUOROSCOPIC  GUIDANCE  Date: 11/20/2012  Clinical History: 79 year old male with recently diagnosed esophageal cancer in need of durable  central venous access for chemotherapy.  Sedation: Moderate (conscious) sedation was administered during this procedure.  A total of threemg Versed and 15mg  Fentanyl were administered intravenously.  The patient's vital signs were monitored continuously by radiology nursing throughout the course of the procedure.  Total sedation time: 30 minutes  Fluoroscopy Time: 12 seconds  Procedure:  The right neck and chest was prepped with chlorhexidine, and draped in the usual sterile fashion using maximum barrier technique (cap and mask, sterile gown, sterile gloves, large sterile sheet, hand hygiene and cutaneous antiseptic).  Antibiotic prophylaxis was provided with 2 g Ancef administered IV one hour prior to skin incision.  Local anesthesia was attained by infiltration with 1% lidocaine with epinephrine.  Ultrasound demonstrated patency of the right internal jugular vein, and this was documented with an image.  Under real-time ultrasound guidance, this vein was accessed with a 21 gauge micropuncture needle and image documentation was performed.  A small dermatotomy was made at the access site with an 11 scalpel.  A 0.018" wire was advanced into the SVC and the access needle exchanged for a 32F micropuncture vascular sheath.  The 0.018" wire was then removed and a 0.035" wire advanced into the IVC.  An appropriate location for the subcutaneous reservoir was selected below the clavicle and an incision was made through the skin and underlying soft tissues.  The subcutaneous tissues were then dissected using a combination of blunt and sharp surgical technique and a pocket was formed.  A single lumen power injectable portacatheter was then tunneled through the subcutaneous tissues from the pocket to the dermatotomy and the port reservoir placed within the subcutaneous pocket.  The venous access site was then serially dilated and a peel away vascular sheath placed over the wire.  The wire was removed and the port catheter  advanced into position under fluoroscopic guidance. The catheter tip is positioned in the superior cavoatrial junction. This was documented with a spot image. The portacatheter was then tested and found to flush and aspirate well.  The port was flushed with saline followed by 100 units/mL heparinized saline.  The pocket was then closed in two layers using first subdermal inverted interrupted absorbable sutures followed by a running subcuticular suture.  The epidermis was then sealed with Dermabond. The dermatotomy at the venous access site was also closed with a single inverted  subdermal suture and the epidermis sealed with Dermabond.  Complications:  None.  The patient tolerated the procedure well.  IMPRESSION:  Successful placement of a right IJapproach PowerPort with ultrasound and fluoroscopic guidance.  The catheter is ready for use.  Signed,  Sterling Big, MD Vascular & Interventional Radiologist [facility] Radiology   Original Report Authenticated By: Malachy Moan, M.D.   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 Through the interpreter I have discussed this with the patient's family and the patient. It appears that his support including IV access and feeding jejunostomy tube has been placed. Family notes that he is to start treatment August 1.    Delight Ovens MD      [address] [place] [place] [address] Office [phone]   Beeper [phone]  12/01/2012 4:11 PM

## 2012-12-01 NOTE — Progress Notes (Signed)
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 male who presented with dysphagia and weight loss. He has lost about 25 pounds. He has profound dysphagia with liquids and solids and pills. Recently he had a J-tube placed.  CT CAP with cont on 11-13-12 showed  1. Bulky annular soft tissue mass involving the distal thoracic  esophagus, consistent with known primary esophageal carcinoma.  2. Metastatic lymphadenopathy in the posterior mediastinum and  right paratracheal region. Mild right hilar lymphadenopathy,  possibly also secondary to metastatic disease.  3. 6 mm noncalcified right lower lobe pulmonary nodule. Pulmonary  metastasis cannot be excluded. Consider PET CT scan for further  Evaluation  1. Metastatic upper abdominal lymphadenopathy in the gastrohepatic  ligament.  2. 1.5 cm hypovascular lesion in the posterior segment of the  right hepatic lobe, suspicious for liver metastasis. Consider PET  CT scan or abdomen MRI without and with contrast for further  evaluation.  Gastrointestinal endoscopy on 11/13/2012 demonstrated a malignant appearing mass with the proximal edge located 27 cm from the incisors in the proximal to mid esophagus. This extended distally and became circumferential and obstructing. The gastroscope could not be advanced into the patient's stomach.  Biopsy of the esophagus on 11/13/2012 demonstrated squamous cell carcinoma.  MR liver w wo contrast on 7-15 showed  1. Again demonstrated are an infiltrative mass of the distal  esophagus with enlarged distal para esophageal and celiac nodes  worrisome for nodal metastases.    2. The previously demonstrated lesion in the posterior segment of  the right hepatic lobe has MR features which are at least  moderately suspicious for a metastasis. Consider tissue sampling.  This may be difficult to biopsy percutaneously given its position  in the dome of the right hepatic lobe. Therefore, PET CT might  prove helpful.  3. Right lower lobe pulmonary nodule as seen on CT, also  suspicious for a possible metastasis.  PET scan is pending for later today.  The patient has met with Dr. Karel Jarvis of medical oncology. Dr. Karel Jarvis has plans for systemic therapy. Specific plans are still pending the PET results.  The patient reports that he smoked for limited time in his teenage years. He reports that he drank alcohol until 4 years ago and then he stopped. It's difficult to pin down exactly how much he drank per day.  He denies any prior history of cancer. He reports pain around his J-tube site. He did have pain in the upper abdomen prior to the J-tube placement but the pain has been worse since then. He has a appointment with our nutritionist today. He is instilling nutritional shakes through the tube. He cannot swallow his MS Contin. He has had constipation for about 5 days he cannot tolerate the suppositories of Dulcolax.   PREVIOUS RADIATION THERAPY: No  PAST MEDICAL HISTORY:  has a past medical history of GERD (gastroesophageal reflux disease) (No knownn hx but, pt reports symptoms of mid sternal pain at xiphoid that is irriated when eating.) and Esophageal cancer (11/13/2012).    PAST SURGICAL HISTORY: Past Surgical History  Procedure Laterality Date  . Esophagogastroduodenoscopy (egd) with esophageal dilation N/A  11/13/2012    Procedure: ESOPHAGOGASTRODUODENOSCOPY (EGD) WITH ESOPHAGEAL DILATION;  Surgeon: Rachael Fee, MD;  Location: [facility] ENDOSCOPY;  Service: Endoscopy;  Laterality: N/A;  . Savory dilation N/A 11/13/2012    Procedure: SAVORY DILATION;  Surgeon: Rachael Fee,  MD;  Location[facility] ENDOSCOPY;  Service: Endoscopy;  Laterality: N/A;    FAMILY HISTORY: family history is not on file.  SOCIAL HISTORY:  reports that he has never smoked. He does not have any smokeless tobacco history on file. He reports that he does not drink alcohol or use illicit drugs.  ALLERGIES: Review of patient's allergies indicates no known allergies.  MEDICATIONS:  Current Outpatient Prescriptions  Medication Sig Dispense Refill  . bisacodyl (DULCOLAX) 10 MG suppository Place 1 suppository (10 mg total) rectally as needed for constipation.  50 suppository  3  . docusate (COLACE) 50 MG/5ML liquid Take 10 mLs (100 mg total) by mouth 2 (two) times daily.  480 mL  3  . feeding supplement (PRO-STAT SUGAR FREE 64) LIQD Place 30 mLs into feeding tube 2 (two) times daily.  900 mL  30  . morphine (MS CONTIN) 30 MG 12 hr tablet Take 1 tablet (30 mg total) by mouth every 12 (twelve) hours.  60 tablet  0  . morphine 10 MG/5ML solution Place 2.5 mLs (5 mg total) into feeding tube every 4 (four) hours as needed for pain.  30 mL  0  . Nutritional Supplements (FEEDING SUPPLEMENT, OSMOLITE 1.2 CAL,) LIQD Place 355 mLs into feeding tube 4 (four) times daily.  1500 mL  30  . omeprazole (PRILOSEC) 2 mg/mL SUSP Place 10 mLs (20 mg total) into feeding tube daily.  100 mL  3  . OVER THE COUNTER MEDICATION Take 1 tablet by mouth once. OTC med for ulcer      . Probiotic Product (PROBIOTIC DAILY PO) Take 1 tablet by mouth once.      . Water For Irrigation, Sterile (FREE WATER) SOLN Use 60ml flush before and after each bolus feeding      . Water For Irrigation, Sterile (FREE WATER) SOLN Place 200 mLs into feeding tube daily.       No current facility-administered medications for this encounter.    REVIEW OF SYSTEMS:  As above   PHYSICAL EXAM:  height is 5' (1.524 m) and weight is 121 lb 9.6 oz (55.157 kg). His oral temperature is 98.4 F (36.9 C). His blood pressure is 128/79 and his pulse is 87. His  respiration is 20.   General: Alert and oriented, in no acute distress HEENT: Head is normocephalic. Pupils are equally round and reactive to light. Extraocular movements are intact. Oropharynx is clear. Neck: Neck is supple, no palpable cervical or supraclavicular lymphadenopathy. Heart: Regular in rate and rhythm with no murmurs, rubs, or gallops. Chest: Clear to auscultation bilaterally, with no rhonchi, wheezes, or rales. Abdomen: J-tube in place. No signs of infection at the stoma  Extremities: Skin is dry with signs of poor vascular supply in lower extremities. No cyanosis or edema. Lymphatics: No concerning lymphadenopathy. Skin: No concerning lesions. Musculoskeletal: symmetric strength and muscle tone throughout. Neurologic: Cranial nerves II through XII are grossly intact. No obvious focalities. Speech is fluent. Coordination is intact. Psychiatric: Judgment and insight are intact. Affect is appropriate.   LABORATORY DATA:  Lab Results  Component Value Date   WBC 5.8 11/18/2012   HGB 11.8* 11/18/2012   HCT 35.0* 11/18/2012   MCV 81.6 11/18/2012   PLT 207 11/18/2012  CMP     Component Value Date/Time   NA 137 11/21/2012 0532   K 4.6 11/21/2012 0532   CL 102 11/21/2012 0532   CO2 31 11/21/2012 0532   GLUCOSE 99 11/21/2012 0532   BUN 13 11/21/2012 0532   CREATININE 1.00 11/21/2012 0532   CALCIUM 9.8 11/21/2012 0532   GFRNONAA 68* 11/21/2012 0532   GFRAA 79* 11/21/2012 0532         RADIOGRAPHY: Ct Chest W Contrast  11/13/2012   *RADIOLOGY REPORT*  Clinical Data:  Esophageal carcinoma.  CT CHEST, ABDOMEN AND PELVIS WITH CONTRAST  Technique:  Multidetector CT imaging of the chest, abdomen and pelvis was performed following the standard protocol during bolus administration of intravenous contrast.  Contrast: OMNIPAQUE IOHEXOL 300 MG/ML  SOLN  Comparison:   None.  CT CHEST  Findings:  A bulky annular soft tissue mass is seen involving the distal thoracic esophagus which measures  approximately 3.2 x 4.0 x 5.9 cm.  Para esophageal lymphadenopathy is seen within the posterior mediastinum both anterior and posterior to the descending thoracic aorta.  Largest lymph node measures 1.5 cm on image 33. Mediastinal lymphadenopathy also seen in the right paratracheal region measuring 2.0 x 2.8 cm on image 10. A 1.3 cm right hilar lymph node is seen with a small central focus of calcification. This could represent metastatic disease or partially calcified granulomatous disease.  No evidence of pleural or pericardial effusion. A 6 mm noncalcified pulmonary nodule is seen in the right lower lobe on image 29, which raises suspicion for pulmonary metastasis.  No other suspicious pulmonary nodules or masses are identified.  IMPRESSION:  1.  Bulky annular soft tissue mass involving the distal thoracic esophagus, consistent with known primary esophageal carcinoma. 2.  Metastatic lymphadenopathy in the posterior mediastinum and right paratracheal region.  Mild right hilar lymphadenopathy, possibly also secondary to metastatic disease. 3.  6 mm noncalcified right lower lobe pulmonary nodule.  Pulmonary metastasis cannot be excluded.  Consider PET CT scan for further evaluation  CT ABDOMEN AND PELVIS  Findings:  Upper abdominal lymphadenopathy is seen gastrohepatic ligament measuring 2.9 x 3.8 cm on image 47.  No other pathologically enlarged nodes are seen within the abdomen or pelvis.  A hypovascular lesion is seen in the posterior segment right hepatic lobe measuring 1.5 cm on image 37.  This is suspicious for a liver metastasis.  No other liver lesions are identified.  The gallbladder, pancreas, spleen, adrenal glands, kidneys are normal in appearance.  No evidence of hydronephrosis.  No pelvic masses or lymphadenopathy identified.  No evidence of inflammatory process or abnormal fluid collections.  No suspicious bone lesions identified.  IMPRESSION:  1.  Metastatic upper abdominal lymphadenopathy in the  gastrohepatic ligament. 2.  1.5 cm hypovascular lesion in the posterior segment of the right hepatic lobe, suspicious for liver metastasis.  Consider PET CT scan or abdomen MRI without and with contrast for further evaluation.   Original Report Authenticated By: Myles Rosenthal, M.D.   Ct Abdomen Pelvis W Contrast  11/13/2012   *RADIOLOGY REPORT*  Clinical Data:  Esophageal carcinoma.  CT CHEST, ABDOMEN AND PELVIS WITH CONTRAST  Technique:  Multidetector CT imaging of the chest, abdomen and pelvis was performed following the standard protocol during bolus administration of intravenous contrast.  Contrast: OMNIPAQUE IOHEXOL 300 MG/ML  SOLN  Comparison:   None.  CT CHEST  Findings:  A bulky annular soft tissue mass is seen involving the distal thoracic esophagus which  measures approximately 3.2 x 4.0 x 5.9 cm.  Para esophageal lymphadenopathy is seen within the posterior mediastinum both anterior and posterior to the descending thoracic aorta.  Largest lymph node measures 1.5 cm on image 33. Mediastinal lymphadenopathy also seen in the right paratracheal region measuring 2.0 x 2.8 cm on image 10. A 1.3 cm right hilar lymph node is seen with a small central focus of calcification. This could represent metastatic disease or partially calcified granulomatous disease.  No evidence of pleural or pericardial effusion. A 6 mm noncalcified pulmonary nodule is seen in the right lower lobe on image 29, which raises suspicion for pulmonary metastasis.  No other suspicious pulmonary nodules or masses are identified.  IMPRESSION:  1.  Bulky annular soft tissue mass involving the distal thoracic esophagus, consistent with known primary esophageal carcinoma. 2.  Metastatic lymphadenopathy in the posterior mediastinum and right paratracheal region.  Mild right hilar lymphadenopathy, possibly also secondary to metastatic disease. 3.  6 mm noncalcified right lower lobe pulmonary nodule.  Pulmonary metastasis cannot be excluded.   Consider PET CT scan for further evaluation  CT ABDOMEN AND PELVIS  Findings:  Upper abdominal lymphadenopathy is seen gastrohepatic ligament measuring 2.9 x 3.8 cm on image 47.  No other pathologically enlarged nodes are seen within the abdomen or pelvis.  A hypovascular lesion is seen in the posterior segment right hepatic lobe measuring 1.5 cm on image 37.  This is suspicious for a liver metastasis.  No other liver lesions are identified.  The gallbladder, pancreas, spleen, adrenal glands, kidneys are normal in appearance.  No evidence of hydronephrosis.  No pelvic masses or lymphadenopathy identified.  No evidence of inflammatory process or abnormal fluid collections.  No suspicious bone lesions identified.  IMPRESSION:  1.  Metastatic upper abdominal lymphadenopathy in the gastrohepatic ligament. 2.  1.5 cm hypovascular lesion in the posterior segment of the right hepatic lobe, suspicious for liver metastasis.  Consider PET CT scan or abdomen MRI without and with contrast for further evaluation.   Original Report Authenticated By: Myles Rosenthal, M.D.    Dg Esophagus  11/21/2012   *RADIOLOGY REPORT*  Clinical Data:Known esophageal carcinoma, evaluate for tracheoesophageal fistula  ESOPHAGUS/BARIUM SWALLOW/TABLET STUDY  Fluoroscopy Time: 0 minutes 51 seconds  Comparison: CT thorax 11/13/2012  Findings: Normal esophageal motility.  There is irregularity of the esophageal lumen and mucosa involving the junction of the mid to distal esophagus.  Contrast remains appropriately contained within the esophagus with no evidence of tracheoesophageal fistula or perforation.  This is a limited study as the patient can only tolerate small boluses.  This is sufficient to exclude tracheoesophageal fistula that detailed evaluation of the esophagus is otherwise not obtained.  IMPRESSION: No evidence of tracheoesophageal fistula.   Original Report Authenticated By: Esperanza Heir, M.D.   Mr Liver W Wo Contrast  11/17/2012    *RADIOLOGY REPORT*  Clinical Data:  Esophageal squamous cell cancer with progressive dysphagia and weight loss. Liver lesion on CT.  MRI ABDOMEN WITHOUT AND WITH CONTRAST  Technique:  Multiplanar multisequence MR imaging of the abdomen was performed both before and after the administration of intravenous contrast.  Contrast: 12mL MULTIHANCE GADOBENATE DIMEGLUMINE 529 MG/ML IV SOLN  Comparison:  CTs of the chest, abdomen and pelvis 11/13/2012.  Findings:  Study is mildly motion degraded, especially on the postcontrast images.  The previously demonstrated lesion within the posterior dome of the right hepatic lobe measures 1.4 cm in diameter.  This lesion demonstrates homogeneous low T1 and  heterogeneous T2 signal.  There is associated restricted diffusion. Postcontrast, this lesion demonstrates peripheral enhancement which appears continuous.  This is at least moderately suspicious for a metastasis.  No other liver lesions are identified.  The previously demonstrated infiltrative mass involving the distal esophagus appears grossly unchanged.  Adjacent distal para esophageal and celiac lymph nodes are unchanged, worrisome for metastatic disease.  There is a right lower lobe pulmonary nodule which was seen on the CT.  The spleen, adrenal glands, gallbladder, pancreas and kidneys appear normal.  There are no worrisome osseous findings.  IMPRESSION:  1.  Again demonstrated are an infiltrative mass of the distal esophagus with enlarged distal para esophageal and celiac nodes worrisome for nodal metastases. 2.  The previously demonstrated lesion in the posterior segment of the right hepatic lobe has MR features which are at least moderately suspicious for a metastasis.  Consider tissue sampling. This may be difficult to biopsy percutaneously given its position in the dome of the right hepatic lobe.  Therefore, PET CT might prove helpful. 3.  Right lower lobe pulmonary nodule as seen on CT, also suspicious for a possible  metastasis.   Original Report Authenticated By: Carey Bullocks, M.D.   IMPRESSION/PLAN: This is a lovely 79 year old gentleman with at least stage III squamous cell carcinoma of the esophagus. PET scan is pending. Possible metastatic disease indicated on the scans above.  I spoke to the patient about his goals of care. He is troubled by his dysphagia. It is profound. I think that radiotherapy could play an important role in his care or whether it is palliative or curative. We spoke about the scenario of stage III disease in which concurrent chemoradiation could play a potentially curative role in his treatment, radiotherapy would take place over 5 and half weeks in this scenario. Alternatively, if he is shown to have stage IV disease, radiotherapy for approximately 2 weeks could play a palliative role in possibly improving his dysphagia. He understands that radiotherapy to the distal esophagus and regional lymph nodes can cause esophagitis and fatigue and skin irritation as well as nausea and/or diarrhea. We spoke about more rare side effects including permanent injury to internal organs. Consent form has been signed and placed in his chart. He is enthusiastic to proceed with radiation, whether it it will be with palliative or curative intent. We will tentatively schedule him for simulation sometime in the next week. Family is unsure of their schedule, so I will have my therapists called him later to arrange this.   I changed the patient's MS Contin to Duragesic 25 due to his dysphagia. I refilled his morphine suspension for breakthrough pain. Since he cannot tolerate suppositories and is constipated I recommended MiraLAX twice a day until he has a bowel movement, then once a day for maintenance.  I spent 55 minutes  face to face with the patient and more than 50% of that time was spent in counseling and/or coordination of care.   __________________________________________   Lonie Peak, MD

## 2012-12-01 NOTE — Telephone Encounter (Signed)
Pt/Snow came in today to r/s 8/7 appt to 8/12. Pt/Snow given new schedule.

## 2012-12-01 NOTE — Progress Notes (Signed)
Patient is an 79 year old male diagnosed with esophageal cancer.  Past medical history includes, dysphasia, GERD, and G-tube placement.  Medications include Dulcolax, Colace, Prilosec, probiotic.  Labs were reviewed.  Height: 5 feet. Weight: 121.6 pounds July 29. Usual body weight 137 pounds. BMI: 23.75.  Estimated nutrition needs: 1650-1925 calories, 85-95 g protein, 1.8 L fluid.  Current tube feeding: Osmolite 1.2, 355 mL 4 times a day with 60 mL of free water before and after each bolus feeding plus 200 mL of water once a day.  Tube feeding provides 1710 calories, 79 g protein, 1170 mL free water.  Total free water 1850 mL. daily.  I met with patient, daughter, son-in-law, and interpreter.  Patient confirms weight loss prior to diagnosis: He only swallows water by mouth.  He does have constipation, and has not had a bowel movement for 5 days.  He reports no problems with current tube feeding regimen.  Patient did not confirm whether or not he was giving Protostat for additional protein.  Wife administers tube feeding and she was not present at time of visit.  Patient is unsure of his treatment plan at this time.  Nutrition diagnosis: Inadequate oral intake related to diagnosis of esophageal cancer as evidenced by patient's need for G-tube placement and enteral nutrition support.  Intervention: Patient was educated to continue current bolus tube feedings utilizing Osmolite 1.2 to provide greater than 90% of estimated nutrition needs.  Patient was educated to ensure adequate free water given.  Patient advised on bowel regimen by physician.  Questions were answered.  Contact information provided.  Monitoring, evaluation, goals: Patient will tolerate tube feedings to provide greater than 90% of estimated nutrition needs to minimize weight loss.  Next visit: I encouraged patient and family to contact me with questions.  I will assist him as needed throughout treatment once treatment plan is  decided.

## 2012-12-01 NOTE — Progress Notes (Signed)
Please see the Nurse Progress Note in the MD Initial Consult Encounter for this patient. 

## 2012-12-01 NOTE — Addendum Note (Signed)
Encounter addended by: Kore Madlock Mintz Aras Albarran, RN on: 12/01/2012  5:51 PM<BR>     Documentation filed: Charges VN

## 2012-12-02 ENCOUNTER — Other Ambulatory Visit: Payer: Self-pay | Admitting: Radiology

## 2012-12-03 ENCOUNTER — Ambulatory Visit (HOSPITAL_COMMUNITY): Admission: RE | Admit: 2012-12-03 | Payer: Self-pay | Source: Ambulatory Visit

## 2012-12-03 ENCOUNTER — Ambulatory Visit (HOSPITAL_COMMUNITY)
Admission: RE | Admit: 2012-12-03 | Discharge: 2012-12-03 | Disposition: A | Discharge status: Home / Self Care | Payer: Self-pay | Source: Ambulatory Visit | Attending: Hematology and Oncology | Admitting: Hematology and Oncology

## 2012-12-03 ENCOUNTER — Encounter (HOSPITAL_COMMUNITY): Payer: Self-pay

## 2012-12-03 ENCOUNTER — Other Ambulatory Visit (HOSPITAL_COMMUNITY): Payer: Self-pay | Admitting: Interventional Radiology

## 2012-12-03 ENCOUNTER — Ambulatory Visit (HOSPITAL_COMMUNITY)
Admission: RE | Admit: 2012-12-03 | Discharge: 2012-12-03 | Disposition: A | Discharge status: Home / Self Care | Payer: Self-pay | Source: Ambulatory Visit | Attending: Interventional Radiology | Admitting: Interventional Radiology

## 2012-12-03 DIAGNOSIS — C159 Malignant neoplasm of esophagus, unspecified: Secondary | ICD-10-CM | POA: Insufficient documentation

## 2012-12-03 DIAGNOSIS — K219 Gastro-esophageal reflux disease without esophagitis: Secondary | ICD-10-CM | POA: Insufficient documentation

## 2012-12-03 DIAGNOSIS — C155 Malignant neoplasm of lower third of esophagus: Secondary | ICD-10-CM

## 2012-12-03 DIAGNOSIS — Z9889 Other specified postprocedural states: Secondary | ICD-10-CM

## 2012-12-03 DIAGNOSIS — C787 Secondary malignant neoplasm of liver and intrahepatic bile duct: Secondary | ICD-10-CM | POA: Insufficient documentation

## 2012-12-03 LAB — PROTIME-INR: Prothrombin Time: 12 seconds (ref 11.6–15.2)

## 2012-12-03 LAB — CBC
MCH: 27.3 pg (ref 26.0–34.0)
MCV: 81.2 fL (ref 78.0–100.0)
Platelets: 363 10*3/uL (ref 150–400)
RBC: 4.1 MIL/uL — ABNORMAL LOW (ref 4.22–5.81)
RDW: 14.5 % (ref 11.5–15.5)
WBC: 9.6 10*3/uL (ref 4.0–10.5)

## 2012-12-03 MED ORDER — MIDAZOLAM HCL 2 MG/2ML IJ SOLN
INTRAMUSCULAR | Status: DC | PRN
  Administered 2012-12-03: 0.5 mg via INTRAVENOUS
  Administered 2012-12-03: 1 mg via INTRAVENOUS

## 2012-12-03 MED ORDER — FENTANYL CITRATE 0.05 MG/ML IJ SOLN
INTRAMUSCULAR | Status: DC | PRN
  Administered 2012-12-03: 50 ug via INTRAVENOUS
  Administered 2012-12-03: 25 ug via INTRAVENOUS

## 2012-12-03 MED ORDER — SODIUM CHLORIDE 0.9 % IV SOLN
INTRAVENOUS | Status: DC | PRN
  Administered 2012-12-03: 20 mL/h via INTRAVENOUS

## 2012-12-03 MED ORDER — SODIUM CHLORIDE 0.9 % IV SOLN: INTRAVENOUS | Status: DC

## 2012-12-03 NOTE — Procedures (Signed)
Interventional Radiology Procedure Note  Procedure: US guided biopsy of liver lesion Complications: None Recommendations: - Bedrest x 3 hrs - CXR at 2 hrs to exclude complication - Pathology pending  Signed,  Sterling Big, MD Vascular & Interventional Radiologist [facility] Radiology

## 2012-12-03 NOTE — ED Notes (Addendum)
Patient denies pain and is resting comfortably. Report given to [facility] on Short Stay. Patient awaiting transport.

## 2012-12-03 NOTE — ED Notes (Signed)
Communicating to patient via interpreter who will be present for the case.

## 2012-12-03 NOTE — ED Notes (Signed)
Awaiting short stay bed. Family and interpreter present.

## 2012-12-03 NOTE — H&P (Signed)
Agree with PA note.  Will attempt US guided biopsy.  Lesion is small and very high in the dome of the liver.  Will be challenging to target.  Signed,  Sterling Big, MD Vascular & Interventional Radiology [facility] Radiology

## 2012-12-03 NOTE — ED Notes (Signed)
Patient feels hungry. Osmolyte 1.2 cal. Ordered to be delivered to Short Stay and Short Stay nurse aware.

## 2012-12-03 NOTE — H&P (Signed)
Douglas Jordan is an 79 y.o. male.   Chief Complaint: Liver lesion Hx esophageal ca 7/29 +PET:  Liver lesion Scheduled now for liver biopsy HPI: esoph ca; GERD  Past Medical History  Diagnosis Date  . GERD (gastroesophageal reflux disease) No knownn hx but, pt reports symptoms of mid sternal pain at xiphoid that is irriated when eating.  . Esophageal cancer 11/13/2012    Past Surgical History  Procedure Laterality Date  . Esophagogastroduodenoscopy (egd) with esophageal dilation N/A 11/13/2012    Procedure: ESOPHAGOGASTRODUODENOSCOPY (EGD) WITH ESOPHAGEAL DILATION;  Surgeon: Rachael Fee, MD;  Location: [facility] ENDOSCOPY;  Service: Endoscopy;  Laterality: N/A;  . Savory dilation N/A 11/13/2012    Procedure: SAVORY DILATION;  Surgeon: Rachael Fee, MD;  Location: [facility] ENDOSCOPY;  Service: Endoscopy;  Laterality: N/A;    No family history on file. Social History:  reports that he has never smoked. He does not have any smokeless tobacco history on file. He reports that he does not drink alcohol or use illicit drugs.  Allergies: No Known Allergies   (Not in a hospital admission)  Results for orders placed during the hospital encounter of 12/03/12 (from the past 48 hour(s))  APTT     Status: None   Collection Time    12/03/12  9:30 AM      Result Value Range   aPTT 24  24 - 37 seconds  CBC     Status: Abnormal   Collection Time    12/03/12  9:30 AM      Result Value Range   WBC 9.6  4.0 - 10.5 K/uL   RBC 4.10 (*) 4.22 - 5.81 MIL/uL   Hemoglobin 11.2 (*) 13.0 - 17.0 g/dL   HCT 16.1 (*) 09.6 - 04.5 %   MCV 81.2  78.0 - 100.0 fL   MCH 27.3  26.0 - 34.0 pg   MCHC 33.6  30.0 - 36.0 g/dL   RDW 40.9  81.1 - 91.4 %   Platelets 363  150 - 400 K/uL  PROTIME-INR     Status: None   Collection Time    12/03/12  9:30 AM      Result Value Range   Prothrombin Time 12.0  11.6 - 15.2 seconds   INR 0.90  0.00 - 1.49   Nm Pet Image Initial (pi) Skull Base To Thigh  12/01/2012   *RADIOLOGY  REPORT*  Clinical Data: Initial treatment strategy for esophageal cancer.  NUCLEAR MEDICINE PET SKULL BASE TO THIGH  Fasting Blood Glucose:  97  Technique:  18.5 mCi F-18 FDG was injected intravenously. CT data was obtained and used for attenuation correction and anatomic localization only.  (This was not acquired as a diagnostic CT examination.) Additional exam technical data entered on technologist worksheet.  Comparison:  11/13/2012  Findings:  Neck: No hypermetabolic lymph nodes in the neck.  Chest:  Circumferential tumor involving the distal half of the thoracic esophagus is identified.  This measures approximately 10.3 cm in length originating just below the level of the carina and extending to the level of the GE junction.  The SUV max associated this tumor is equal to 32.7.  Hypermetabolic right paratracheal lymph node is identified.  This measures 1.9 cm and has an SUV max equal to 16.1, image 46.  Abdomen/Pelvis:  There is a low attenuation lesion within the right hepatic lobe measuring 2.2 cm.  Associated increased FDG uptake is identified with an SUV max equal to 8.4, image 84.  Hypermetabolic  upper abdominal adenopathy is identified.  In the gastrohepatic ligament region there is a lymph node mass measuring approximately 4.5 x 3.9 cm, image 102.  This has an SUV max equal to 21.4, image 102.  Hypermetabolic portacaval lymph node is also noted. No evidence for hypermetabolic metastasis to the pelvis.  Skeleton:  No focal hypermetabolic activity to suggest skeletal metastasis.  IMPRESSION:  1.  Examination is positive for intensely hypermetabolic tumor involving the distal esophagus. 2.  Hypermetabolic right paratracheal lymph node metastasis. 3.  Hypermetabolic lymph node metastasis to the gastrohepatic ligament region and portocaval lymph node region. 4.  Hypermetabolic liver metastasis.   Original Report Authenticated By: Signa Kell, M.D.    Review of Systems  Constitutional: Positive for weight  loss. Negative for fever.       Understands little English Speaks none  HENT: Positive for neck pain.   Cardiovascular: Negative for chest pain.  Gastrointestinal: Positive for abdominal pain. Negative for nausea and vomiting.  Neurological: Positive for weakness. Negative for headaches.    Blood pressure 152/78, pulse 82, temperature 97.9 F (36.6 C), temperature source Oral, resp. rate 18, height 5\' 2"  (1.575 m), weight 119 lb (53.978 kg), SpO2 100.00%. Physical Exam  Constitutional: He is oriented to person, place, and time.  Cardiovascular: Normal rate, regular rhythm and normal heart sounds.   No murmur heard. Respiratory: Effort normal and breath sounds normal. He has no wheezes.  GI: Soft. Bowel sounds are normal. There is no tenderness.  Musculoskeletal: Normal range of motion.  Neurological: He is alert and oriented to person, place, and time.  Psychiatric: He has a normal mood and affect. His behavior is normal. Judgment and thought content normal.  Interpreter confirms good understanding     Assessment/Plan Liver lesion + PET Hx esophageal ca Scheduled for liver lesion bx today Pt and dtr in law aware of procedure benefits and risks Through interpreter Agreeable to proceed Consent signed andin chart  Tauren Delbuono A 12/03/2012, 10:51 AM

## 2012-12-04 ENCOUNTER — Ambulatory Visit
Admission: RE | Admit: 2012-12-04 | Discharge: 2012-12-04 | Disposition: A | Discharge status: Home / Self Care | Payer: Self-pay | Source: Ambulatory Visit | Attending: Radiation Oncology | Admitting: Radiation Oncology

## 2012-12-04 DIAGNOSIS — K7689 Other specified diseases of liver: Secondary | ICD-10-CM | POA: Insufficient documentation

## 2012-12-04 DIAGNOSIS — R5383 Other fatigue: Secondary | ICD-10-CM | POA: Insufficient documentation

## 2012-12-04 DIAGNOSIS — R5381 Other malaise: Secondary | ICD-10-CM | POA: Insufficient documentation

## 2012-12-04 DIAGNOSIS — Z510 Encounter for antineoplastic radiation therapy: Secondary | ICD-10-CM | POA: Insufficient documentation

## 2012-12-04 DIAGNOSIS — R1310 Dysphagia, unspecified: Secondary | ICD-10-CM | POA: Insufficient documentation

## 2012-12-04 DIAGNOSIS — C155 Malignant neoplasm of lower third of esophagus: Secondary | ICD-10-CM

## 2012-12-04 DIAGNOSIS — C159 Malignant neoplasm of esophagus, unspecified: Secondary | ICD-10-CM | POA: Insufficient documentation

## 2012-12-04 NOTE — Progress Notes (Addendum)
  Radiation Oncology         [phone] ________________________________  Name: Douglas Jordan MRN: [id number]  Date: 12/04/2012  DOB: 1929/10/15  SIMULATION AND TREATMENT PLANNING NOTE / 4D sim / Spec Tx Proc.  Outpatient  DIAGNOSIS:  Esophageal cancer  NARRATIVE:  The patient was brought to the CT Simulation planning suite.  Identity was confirmed.  All relevant records and images related to the planned course of therapy were reviewed.  The patient freely provided informed written consent to proceed with treatment after reviewing the details related to the planned course of therapy. The consent form was witnessed and verified by the simulation staff.    Then, the patient was set-up in a stable reproducible  supine position for radiation therapy.  CT images were obtained.  Surface markings were placed.  The CT images were loaded into the planning software.    RESPIRATORY MOTION MANAGEMENT SIMULATION  NARRATIVE:  In order to account for effect of respiratory motion on target structures and other organs in the planning and delivery of radiotherapy, this patient underwent respiratory motion management simulation.  To accomplish this, when the patient was brought to the CT simulation planning suite, 4D respiratory motion management CT images were obtained.  The CT images were loaded into the planning software.  Then, using a variety of tools including Cine, MIP, and standard views, the target volume and planning target volumes (PTV) were delineated.  Avoidance structures were contoured.  Treatment planning then occurred.   TREATMENT PLANNING NOTE: The radiation prescription was entered and confirmed.    A total of 4 medically necessary complex treatment devices were fabricated and supervised by me - 3 fields with MLCs for custom blocks against the following normal tissues: heart, lungs, cord.  And, vaclock device.  I have requested : 3D Simulation  I have requested a DVH of the following structures:  heart, lungs, esophagus, cord, target volumes.    The patient will receive 30 Gy in 10 fractions to his esophageal tumor. Treatment is palliative as he has Stage IV disease.  Special Treatment Procedure Note: The patient will be receiving chemotherapy concurrently. Chemotherapy heightens the risk of side effects. I have considered this during the patient's treatment planning process and will monitor the patient accordingly for side effects on a weekly basis. Concurrent chemotherapy increases the complexity of this patient's treatment and therefore this constitutes a special treatment procedure.    -----------------------------------  Lonie Peak, MD

## 2012-12-09 ENCOUNTER — Other Ambulatory Visit: Payer: Self-pay

## 2012-12-09 ENCOUNTER — Telehealth: Payer: Self-pay | Admitting: Medical Oncology

## 2012-12-09 NOTE — Telephone Encounter (Signed)
Pt 's son called asking if we have the results of the liver biopsy. Per Dr. Karel Jarvis the biopsy is positive. I gave this information to the son. He was asking me what stage is the patient. I asked him to discuss this with Dr. Basilio Cairo when he comes in for his radiation. He also asked about his treatments. I told him he will start radiation and then he may get chemotherapy. I explained that Dr. Basilio Cairo can let him know what the treatment plans are.

## 2012-12-10 ENCOUNTER — Ambulatory Visit: Payer: Self-pay

## 2012-12-10 ENCOUNTER — Other Ambulatory Visit: Payer: Self-pay | Admitting: Lab

## 2012-12-11 ENCOUNTER — Ambulatory Visit: Payer: MEDICAID | Admitting: Radiation Oncology

## 2012-12-11 NOTE — Addendum Note (Signed)
Encounter addended by: Bernell List on: 12/11/2012 12:10 PM<BR>     Documentation filed: Charges VN

## 2012-12-14 ENCOUNTER — Ambulatory Visit
Admission: RE | Admit: 2012-12-14 | Discharge: 2012-12-14 | Disposition: A | Discharge status: Home / Self Care | Payer: Self-pay | Source: Ambulatory Visit | Attending: Radiation Oncology | Admitting: Radiation Oncology

## 2012-12-14 ENCOUNTER — Ambulatory Visit
Admission: RE | Admit: 2012-12-14 | Discharge: 2012-12-14 | Disposition: A | Discharge status: Home / Self Care | Payer: MEDICAID | Source: Ambulatory Visit | Attending: Radiation Oncology | Admitting: Radiation Oncology

## 2012-12-14 ENCOUNTER — Encounter: Payer: Self-pay | Admitting: Radiation Oncology

## 2012-12-14 VITALS — BP 133/67 | HR 96 | Temp 98.9°F | Resp 20 | Wt 125.0 lb

## 2012-12-14 DIAGNOSIS — C155 Malignant neoplasm of lower third of esophagus: Secondary | ICD-10-CM

## 2012-12-14 MED ORDER — FENTANYL 25 MCG/HR TD PT72: 1.0000 | MEDICATED_PATCH | TRANSDERMAL | Status: DC

## 2012-12-14 NOTE — Progress Notes (Signed)
Weekly rad txs 1st completed esophagus, patient son interpreting for patient, interpreter left, c/o difficulty swallowing, tongue is coated white, using peg tube, osmolite 6 cand daily  And pr stat 30ml bid, and 60ml free water before and after feedings, wearing fentanyl on chest, needs refill, wants rx changed to [facility] in the [facility],  Peg tube dressing, dry intact, C/o sore ness there as well, energy level fatigued, no c/o nausea, bowels move every 2 days, takes stool softner 9:58 AM

## 2012-12-14 NOTE — Progress Notes (Signed)
Simulation Verification Note Outpatient Esophagus  The patient was brought to the treatment unit and placed in the planned treatment position. The clinical setup was verified. Then port films were obtained and uploaded to the radiation oncology medical record software.  The treatment beams were carefully compared against the planned radiation fields. The position location and shape of the radiation fields was reviewed. They targeted volume of tissue appears to be appropriately covered by the radiation beams. Organs at risk appear to be excluded as planned.  Based on my personal review, I approved the simulation verification. The patient's treatment will proceed as planned.  -----------------------------------  Lonie Peak, MD

## 2012-12-14 NOTE — Progress Notes (Signed)
   Weekly Management Note:  outpatient Current Dose:  3 Gy  Projected Dose: 30 Gy   Narrative:  The patient presents for routine under treatment assessment.  CBCT/MVCT images/Port film x-rays were reviewed.  The chart was checked.   Son, patient and I discussed results from bx:  Diagnosis Liver, needle/core biopsy, lesion - METASTATIC SQUAMOUS CELL CARCINOMA. - SEE COMMENT.   Fatigued and still with profound dysphagia. Hasn't started chemotherapy yet. Med/onc appt tomorrow.  Physical Findings:  weight is 125 lb (56.7 kg). His oral temperature is 98.9 F (37.2 C). His blood pressure is 133/67 and his pulse is 96. His respiration is 20.  NAD  CBC    Component Value Date/Time   WBC 9.6 12/03/2012 0930   RBC 4.10* 12/03/2012 0930   HGB 11.2* 12/03/2012 0930   HCT 33.3* 12/03/2012 0930   PLT 363 12/03/2012 0930   MCV 81.2 12/03/2012 0930   MCH 27.3 12/03/2012 0930   MCHC 33.6 12/03/2012 0930   RDW 14.5 12/03/2012 0930    CMP     Component Value Date/Time   NA 137 11/21/2012 0532   K 4.6 11/21/2012 0532   CL 102 11/21/2012 0532   CO2 31 11/21/2012 0532   GLUCOSE 99 11/21/2012 0532   BUN 13 11/21/2012 0532   CREATININE 1.00 11/21/2012 0532   CALCIUM 9.8 11/21/2012 0532   GFRNONAA 68* 11/21/2012 0532   GFRAA 79* 11/21/2012 0532     Impression:  The patient is tolerating radiotherapy.  Plan:  Continue radiotherapy as planned. Refill for Fentanyl given today. Discussed plan for 10 fractions to esophageal tumor alone - with systemic chemotherapy to control the rest of his disease, hopefully.  Would appreciate any chemosensitization during RT from med/onc, if possible.  Will check in w/ med onc in this respect.   ________________________________   Lonie Peak, M.D.

## 2012-12-15 ENCOUNTER — Telehealth: Payer: Self-pay | Admitting: Oncology

## 2012-12-15 ENCOUNTER — Other Ambulatory Visit (HOSPITAL_BASED_OUTPATIENT_CLINIC_OR_DEPARTMENT_OTHER): Payer: Self-pay

## 2012-12-15 ENCOUNTER — Ambulatory Visit (HOSPITAL_BASED_OUTPATIENT_CLINIC_OR_DEPARTMENT_OTHER): Payer: Self-pay | Admitting: Hematology and Oncology

## 2012-12-15 ENCOUNTER — Ambulatory Visit
Admission: RE | Admit: 2012-12-15 | Discharge: 2012-12-15 | Disposition: A | Discharge status: Home / Self Care | Payer: MEDICAID | Source: Ambulatory Visit | Attending: Radiation Oncology | Admitting: Radiation Oncology

## 2012-12-15 VITALS — BP 139/79 | HR 101 | Temp 96.5°F | Resp 18 | Ht 62.0 in | Wt 123.1 lb

## 2012-12-15 DIAGNOSIS — C155 Malignant neoplasm of lower third of esophagus: Secondary | ICD-10-CM

## 2012-12-15 LAB — COMPREHENSIVE METABOLIC PANEL (CC13)
AST: 14 U/L (ref 5–34)
BUN: 22.6 mg/dL (ref 7.0–26.0)
Calcium: 10.8 mg/dL — ABNORMAL HIGH (ref 8.4–10.4)
Chloride: 100 mEq/L (ref 98–109)
Creatinine: 1.1 mg/dL (ref 0.7–1.3)
Total Bilirubin: 0.39 mg/dL (ref 0.20–1.20)

## 2012-12-15 LAB — CBC WITH DIFFERENTIAL/PLATELET
BASO%: 0.1 % (ref 0.0–2.0)
Basophils Absolute: 0 10*3/uL (ref 0.0–0.1)
EOS%: 0.5 % (ref 0.0–7.0)
HCT: 31 % — ABNORMAL LOW (ref 38.4–49.9)
HGB: 10.2 g/dL — ABNORMAL LOW (ref 13.0–17.1)
LYMPH%: 10.4 % — ABNORMAL LOW (ref 14.0–49.0)
MCH: 25.9 pg — ABNORMAL LOW (ref 27.2–33.4)
MCHC: 32.9 g/dL (ref 32.0–36.0)
MCV: 78.8 fL — ABNORMAL LOW (ref 79.3–98.0)
MONO%: 14.3 % — ABNORMAL HIGH (ref 0.0–14.0)
NEUT%: 74.7 % (ref 39.0–75.0)
Platelets: 271 10*3/uL (ref 140–400)
lymph#: 1.2 10*3/uL (ref 0.9–3.3)

## 2012-12-15 NOTE — Telephone Encounter (Signed)
gave pt appt for lab and MD on 8/27 and chemo class per Vicie Mutters RN

## 2012-12-15 NOTE — Progress Notes (Signed)
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  PRIOR THERAPY: None  CURRENT THERAPY: Palliative radiation to distal esophagus.   Oncological history We saw  [name] in our clinic back in 11/25/2012. [name] 79 y.o. [place] male, non English speaking, who presented initially with 3 month history of progressive dysphagia, initially with solids progressing to liquids, with subsequent 25 lb weight loss. Symptoms were accompanied by significant regurgitation. 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   CT of the chest ion 7/11 revealed bulky annular soft tissue mass involving the distal thoracic esophagus, consistent with known primary esophageal carcinoma. Lymphadenopathy in the posterior mediastinum and right paratracheal region. Mild right hilar lymphadenopathy, possibly also secondary to metastatic disease. and a 6 mm noncalcified right lower lobe pulmonary nodule. Also seen, Metastatic upper abdominal lymphadenopathy in the gastrohepatic ligament.  Additionally, there was a 1.5 cm hypovascular lesion in the posterior segment of the right hepatic lobe, suspicious for liver metastasis.  U/S-guided biopsy of the liver lesion (12/03/2012) came as squamous cell carcinoma, consistent with metastatic disease.   Because the dysphagia and the patient inability to swallow any food, patient was referred to our radiation oncology colleagues and plan was started palliative radiation to the esophagus and then to be followed by systemic chemotherapy.  INTERVAL HISTORY: Douglas Jordan 79 y.o. male returns to the clinic today for for followup and reassessment. As mentioned above, patient underwent  ultrasound-guided biopsy of the liver lesion, which came back positive for metastatic disease, squamous cell carcinoma. In he clinic today, patient the patient is accompanied by his daughter as well as interpreter. Patient started palliative radiation to the esophagus today, plan 10 treatments total, which should be done in 2 weeks about December 25, 2012. Patient stated that his energy level is poor, he still cannot swallow before but is using his J-tube and in fact he gained a few pounds since last visit. He continues to have some chest pain, now he also complaining of shoulder pain and chest. He is taking morphine 10 mg per 5 mL solutions when necessary as well as fentanyl patch.  MEDICAL HISTORY: Past Medical History  Diagnosis Date  . GERD (gastroesophageal reflux disease) No knownn hx but, pt reports symptoms of mid sternal pain at xiphoid that is irriated when eating.  . Esophageal cancer 11/13/2012    ALLERGIES:  has No Known Allergies.  MEDICATIONS:  Current Outpatient Prescriptions  Medication Sig Dispense Refill  . bisacodyl (DULCOLAX) 10 MG suppository Place 1 suppository (10 mg total) rectally as needed for constipation.  50 suppository  3  . docusate (COLACE) 50 MG/5ML liquid Take 10 mLs (100 mg total) by mouth 2 (two) times daily.  480 mL  3  . feeding supplement (PRO-STAT SUGAR FREE 64) LIQD Place 30 mLs into feeding tube 2 (two) times daily.  900 mL  30  . fentaNYL (DURAGESIC - DOSED MCG/HR) 25 MCG/HR patch Place 1 patch (25 mcg total) onto the skin every 3 (three) days.  5 patch  0  . morphine 10 MG/5ML solution Place 2.5 mLs (5 mg total) into feeding tube every 4 (four) hours as needed for pain.  500 mL  0  . Nutritional Supplements (FEEDING SUPPLEMENT, OSMOLITE 1.2 CAL,) LIQD Place 355 mLs into feeding  tube 4 (four) times daily.  1500 mL  30  . omeprazole (PRILOSEC) 2 mg/mL SUSP Place 10 mLs (20 mg total) into feeding tube daily.  100 mL  3  . OVER THE COUNTER MEDICATION Take 1  tablet by mouth once. OTC med for ulcer      . polyethylene glycol powder (MIRALAX) powder Take 17 g by mouth daily. Follow instructions on container, mix with water and inject in tube. Use twice daily until bowel movement, then daily to prevent constipation.  255 g  5  . Probiotic Product (PROBIOTIC DAILY PO) Take 1 tablet by mouth once.      . Water For Irrigation, Sterile (FREE WATER) SOLN Place 200 mLs into feeding tube daily.      . Water For Irrigation, Sterile (FREE WATER) SOLN Use 60ml flush before and after each bolus feeding       No current facility-administered medications for this visit.    SURGICAL HISTORY:  Past Surgical History  Procedure Laterality Date  . Esophagogastroduodenoscopy (egd) with esophageal dilation N/A 11/13/2012    Procedure: ESOPHAGOGASTRODUODENOSCOPY (EGD) WITH ESOPHAGEAL DILATION;  Surgeon: Rachael Fee, MD;  Location: [facility] ENDOSCOPY;  Service: Endoscopy;  Laterality: N/A;  . Savory dilation N/A 11/13/2012    Procedure: SAVORY DILATION;  Surgeon: Rachael Fee, MD;  Location: [facility] ENDOSCOPY;  Service: Endoscopy;  Laterality: N/A;    REVIEW OF SYSTEMS:  Pertinent items are noted in HPI.   PHYSICAL EXAMINATION:  GENERAL: No distress, well nourished.  SKIN:  No rashes or significant lesions  HEAD: Normocephalic, No masses, lesions, tenderness or abnormalities  EYES: Conjunctiva are pink and non-injected  ENT: External ears normal ,lips , buccal mucosa, and tongue normal and mucous membranes are moist  LYMPH: No palpable lymphadenopathy  BREAST:Normal without mass, skin or nipple changes or axillary nodes,  LUNGS: Clear to auscultation, no crackles or wheezes HEART: Regular rate & rhythm, no murmurs, no gallops, S1 normal and S2 normal  ABDOMEN: Abdomen soft, non-tender, normal bowel sounds, no masses or organomegaly and no hepatosplenomegaly. J-Tube in place.  MSK: No CVA tenderness and no tenderness on percussion of the back or rib cage. EXTREMITIES:  No edema, no skin discoloration or tenderness NEURO: Alert & oriented, no focal motor/sensory deficits.   Blood pressure 139/79, pulse 101, temperature 96.5 F (35.8 C), temperature source Oral, resp. rate 18, height 5\' 2"  (1.575 m), weight 123 lb 1.6 oz (55.838 kg).  LABORATORY DATA: Lab Results  Component Value Date   WBC 11.7* 12/15/2012   HGB 10.2* 12/15/2012   HCT 31.0* 12/15/2012   MCV 78.8* 12/15/2012   PLT 271 12/15/2012      Chemistry      Component Value Date/Time   NA 136 12/15/2012 0819   NA 137 11/21/2012 0532   K 4.7 12/15/2012 0819   K 4.6 11/21/2012 0532   CL 102 11/21/2012 0532   CO2 26 12/15/2012 0819   CO2 31 11/21/2012 0532   BUN 22.6 12/15/2012 0819   BUN 13 11/21/2012 0532   CREATININE 1.1 12/15/2012 0819   CREATININE 1.00 11/21/2012 0532      Component Value Date/Time   CALCIUM 10.8* 12/15/2012 0819   CALCIUM 9.8 11/21/2012 0532   ALKPHOS 104 12/15/2012 0819   AST 14 12/15/2012 0819   ALT 10 12/15/2012 0819   BILITOT 0.39 12/15/2012 0819       RADIOGRAPHIC STUDIES: Ir Gastrostomy Tube Mod Sed  11/17/2012   *RADIOLOGY REPORT*  Indication:  Esophageal mass, unable to place gastrostomy tube via endoscopic approach  "PUSH" PERCUTANEOUS GASTOSTOMY TUBE PLACEMENT  Comparison: CT abdomen pelvis - 11/13/2012; abdominal MRI - earlier same day  Medications:  Versed 3 mg IV; Fentanyl 100 mcg IV; Glucagon 1 gm IV; Ancef 2 grams IV; Antibiotics were administered within 1 hour of the procedure.  Contrast volume:  25 mL Omnipaque-300 administered into the gastric lumen  Sedation time: 45 minutes  Fluoroscopy time: 9 minutes, 6 seconds  Complications: None immediate  PROCEDURE/FINDINGS:  Informed written consent was obtained from the patient and the patient's daughter via the use of a Medical translator following explanation of the procedure, risks, benefits and alternatives.  A time out was performed prior to the initiation of the procedure. Ultrasound scanning was performed to  demarcate the edge of the left lobe of the liver.  Maximal barrier sterile technique utilized including caps, mask, sterile gowns, sterile gloves, large sterile drape, hand hygiene and Betadine prep.  The left upper quadrant was sterilely prepped and draped.  An oral gastric catheter was inserted into the stomach under fluoroscopy. The left costal margin and air opacified transverse colon were identified and avoided.  Air was injected into the stomach for insufflation and visualization under fluoroscopy.  Under sterile conditions 3 T-tacts were deployed to pexy the stomach to the anterior abdominal wall percutaneously beneath the left subcostal margin after the overlying soft tissues were anesthetized with 1% Lidocaine with epinephrine.  Each needle position was confirmed within the stomach with aspiration of air and injection of small amount of contrast.  Multiple spot fluoroscopic images were obtained to confirm appropriate intraluminal positioning.  An incision was made between the T tacks and an 18 gauge needle was utilized to access the stomach.  Again, a small amount of air was aspirated and a small amount of contrast was injected confirming appropriate intraluminal positioning.  Over an Amplatz guide wire, the access needle was exchanged for a Kumpe catheter.   With the use of a stiff Glidewire, the Kumpe catheter was manipulated into the descending portion of the duodenum.  Contrast injection confirmed appropriate positioning.  Under intermittent fluoroscopic guidance, Kumpe catheter was exchanged for the telescoping peel away sheath.  This ultimately allowed placement of e 20-French balloon retention gastrostomy tube.   The balloon was insufflated within the gastric lumen and pulled taut against the ventral wall of the stomach.  Contrast injection confirms appropriate positioning within the stomach. Several spot radiographic images were obtained in various obliquities for documentation.  The patient  tolerated procedure well without immediate post procedural complication.  IMPRESSION:  Successful fluoroscopic insertion of an 18-French "push" gastrostomy.   Original Report Authenticated By: Tacey Ruiz, MD   Dg Esophagus  11/21/2012   *RADIOLOGY REPORT*  Clinical Data:Known esophageal carcinoma, evaluate for tracheoesophageal fistula  ESOPHAGUS/BARIUM SWALLOW/TABLET STUDY  Fluoroscopy Time: 0 minutes 51 seconds  Comparison: CT thorax 11/13/2012  Findings: Normal esophageal motility.  There is irregularity of the esophageal lumen and mucosa involving the junction of the mid to distal esophagus.  Contrast remains appropriately contained within the esophagus with no evidence of tracheoesophageal fistula or perforation.  This is a limited study as the patient can only tolerate small boluses.  This is sufficient to exclude tracheoesophageal fistula that detailed evaluation of the esophagus is otherwise not obtained.  IMPRESSION: No evidence of tracheoesophageal fistula.   Original Report Authenticated By: Esperanza Heir, M.D.   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 Liver lesion on CT.  MRI ABDOMEN WITHOUT AND WITH CONTRAST  Technique:  Multiplanar multisequence MR imaging of the abdomen was performed both before and after the administration of intravenous contrast.  Contrast: 12mL MULTIHANCE GADOBENATE DIMEGLUMINE 529 MG/ML IV SOLN  Comparison:  CTs of the chest, abdomen and pelvis 11/13/2012.  Findings:  Study is mildly motion degraded, especially on the postcontrast images.  The previously demonstrated lesion within the posterior dome of the right hepatic lobe measures 1.4 cm in diameter.  This lesion demonstrates homogeneous low T1 and heterogeneous T2 signal.  There is associated restricted diffusion. Postcontrast, this lesion demonstrates peripheral enhancement which appears continuous.  This is  at least moderately suspicious for a metastasis.  No other liver lesions are identified.  The previously demonstrated infiltrative mass involving the distal esophagus appears grossly unchanged.  Adjacent distal para esophageal and celiac lymph nodes are unchanged, worrisome for metastatic disease.  There is a right lower lobe pulmonary nodule which was seen on the CT.  The spleen, adrenal glands, gallbladder, pancreas and kidneys appear normal.  There are no worrisome osseous findings.  IMPRESSION:  1.  Again demonstrated are an infiltrative mass of the distal esophagus with enlarged distal para esophageal and celiac nodes worrisome for nodal metastases. 2.  The previously demonstrated lesion in the posterior segment of the right hepatic lobe has MR features which are at least moderately suspicious for a metastasis.  Consider tissue sampling. This may be difficult to biopsy percutaneously given its position in the dome of the right hepatic lobe.  Therefore, PET CT might prove helpful. 3.  Right lower lobe pulmonary nodule as seen on CT, also suspicious for a possible metastasis.   Original Report Authenticated By: Carey Bullocks, M.D.   Nm Pet Image Initial (pi) Skull Base To Thigh  12/01/2012   *RADIOLOGY REPORT*  Clinical Data: Initial treatment strategy for esophageal cancer.  NUCLEAR MEDICINE PET SKULL BASE TO THIGH  Fasting Blood Glucose:  97  Technique:  18.5 mCi F-18 FDG was injected intravenously. CT data was obtained and used for attenuation correction and anatomic localization only.  (This was not acquired as a diagnostic CT examination.) Additional exam technical data entered on technologist worksheet.  Comparison:  11/13/2012  Findings:  Neck: No hypermetabolic lymph nodes in the neck.  Chest:  Circumferential tumor involving the distal half of the thoracic esophagus is identified.  This measures approximately 10.3 cm in length originating just below the level of the carina and extending to the level  of the GE junction.  The SUV max associated this tumor is equal to 32.7.  Hypermetabolic right paratracheal lymph node is identified.  This measures 1.9 cm and has an SUV max equal to 16.1, image 46.  Abdomen/Pelvis:  There is a low attenuation lesion within the right hepatic lobe measuring 2.2 cm.  Associated increased FDG uptake is identified with an SUV max equal to 8.4, image 84.  Hypermetabolic upper abdominal adenopathy is identified.  In the gastrohepatic ligament region there is a lymph node mass measuring approximately 4.5 x 3.9 cm, image 102.  This has an SUV max equal to 21.4, image 102.  Hypermetabolic portacaval lymph node is also noted. No evidence for hypermetabolic metastasis to the pelvis.  Skeleton:  No focal hypermetabolic activity to suggest skeletal metastasis.  IMPRESSION:  1.  Examination is positive for intensely hypermetabolic tumor involving the distal esophagus. 2.  Hypermetabolic  right paratracheal lymph node metastasis. 3.  Hypermetabolic lymph node metastasis to the gastrohepatic ligament region and portocaval lymph node region. 4.  Hypermetabolic liver metastasis.   Original Report Authenticated By: Signa Kell, M.D.   US Biopsy  12/03/2012   *RADIOLOGY REPORT*  ULTRASOUND-GUIDED CORE BIOPSY  Date: 12/03/2012  Clinical History: Year-old male with a esophageal adenocarcinoma and concern for hepatic metastasis.  Ultrasound-guided biopsy is requested for tissue diagnosis.  Procedures Performed: 1. Ultrasound-guided core biopsy  Interventional Radiologist:  Sterling Big, MD  Sedation: Moderate (conscious) sedation was used.  1.5 mg Versed, 75 mcg Fentanyl were administered intravenously.  The patient's vital signs were monitored continuously by radiology nursing throughout the procedure.  Sedation Time: 17 minutes  PROCEDURE/FINDINGS:   Informed consent was obtained from the patient following explanation of the procedure, risks, benefits and alternatives. The patient  understands, agrees and consents for the procedure. All questions were addressed. A time out was performed.  Maximal barrier sterile technique utilized including caps, mask, sterile gowns, sterile gloves, large sterile drape, hand hygiene, and betadine skin prep.  The right upper quadrant was interrogated with ultrasound.  A hypoechoic lesion is identified high in the right hepatic dome.  A suitable skin entry site was selected and marked.  Local anesthesia was achieved by infiltration 1% lidocaine.  Using direct sonographic guidance, a 15 cm 17 gauge trocar needle was carefully advanced to the margin of the mass.  Multiple 18 gauge core biopsies were then coaxially obtained using the Biopince automated biopsy device.  Biopsy tract was embolized with Gelfoam as the needle was withdrawn.  The patient tolerated the procedure well there was no immediate complication.  IMPRESSION:  Technically successful ultrasound guided core biopsy of a lesion in the right hepatic dome.  Signed,  Sterling Big, MD Vascular & Interventional Radiologist [facility] Radiology   Original Report Authenticated By: Malachy Moan, M.D.   Ir Fluoro Guide Cv Line Right  11/20/2012   *RADIOLOGY REPORT*  TUNNELED CENTRAL VENOUS CATHETER WITH SUBCUTANEOUS RESERVOIR (PORTACATH) PLACEMENT WITH ULTRASOUND AND FLUOROSCOPIC  GUIDANCE  Date: 11/20/2012  Clinical History: 79 year old male with recently diagnosed esophageal cancer in need of durable central venous access for chemotherapy.  Sedation: Moderate (conscious) sedation was administered during this procedure.  A total of threemg Versed and 15mg  Fentanyl were administered intravenously.  The patient's vital signs were monitored continuously by radiology nursing throughout the course of the procedure.  Total sedation time: 30 minutes  Fluoroscopy Time: 12 seconds  Procedure:  The right neck and chest was prepped with chlorhexidine, and draped in the usual sterile fashion using maximum  barrier technique (cap and mask, sterile gown, sterile gloves, large sterile sheet, hand hygiene and cutaneous antiseptic).  Antibiotic prophylaxis was provided with 2 g Ancef administered IV one hour prior to skin incision.  Local anesthesia was attained by infiltration with 1% lidocaine with epinephrine.  Ultrasound demonstrated patency of the right internal jugular vein, and this was documented with an image.  Under real-time ultrasound guidance, this vein was accessed with a 21 gauge micropuncture needle and image documentation was performed.  A small dermatotomy was made at the access site with an 11 scalpel.  A 0.018" wire was advanced into the SVC and the access needle exchanged for a 95F micropuncture vascular sheath.  The 0.018" wire was then removed and a 0.035" wire advanced into the IVC.  An appropriate location for the subcutaneous reservoir was selected below the clavicle and an incision was made  through the skin and underlying soft tissues.  The subcutaneous tissues were then dissected using a combination of blunt and sharp surgical technique and a pocket was formed.  A single lumen power injectable portacatheter was then tunneled through the subcutaneous tissues from the pocket to the dermatotomy and the port reservoir placed within the subcutaneous pocket.  The venous access site was then serially dilated and a peel away vascular sheath placed over the wire.  The wire was removed and the port catheter advanced into position under fluoroscopic guidance. The catheter tip is positioned in the superior cavoatrial junction. This was documented with a spot image. The portacatheter was then tested and found to flush and aspirate well.  The port was flushed with saline followed by 100 units/mL heparinized saline.  The pocket was then closed in two layers using first subdermal inverted interrupted absorbable sutures followed by a running subcuticular suture.  The epidermis was then sealed with Dermabond. The  dermatotomy at the venous access site was also closed with a single inverted subdermal suture and the epidermis sealed with Dermabond.  Complications:  None.  The patient tolerated the procedure well.  IMPRESSION:  Successful placement of a right IJapproach PowerPort with ultrasound and fluoroscopic guidance.  The catheter is ready for use.  Signed,  Sterling Big, MD Vascular & Interventional Radiologist [facility] Radiology   Original Report Authenticated By: Malachy Moan, M.D.   Ir US Guide Vasc Access Right  11/20/2012   *RADIOLOGY REPORT*  TUNNELED CENTRAL VENOUS CATHETER WITH SUBCUTANEOUS RESERVOIR (PORTACATH) PLACEMENT WITH ULTRASOUND AND FLUOROSCOPIC  GUIDANCE  Date: 11/20/2012  Clinical History: 79 year old male with recently diagnosed esophageal cancer in need of durable central venous access for chemotherapy.  Sedation: Moderate (conscious) sedation was administered during this procedure.  A total of threemg Versed and 15mg  Fentanyl were administered intravenously.  The patient's vital signs were monitored continuously by radiology nursing throughout the course of the procedure.  Total sedation time: 30 minutes  Fluoroscopy Time: 12 seconds  Procedure:  The right neck and chest was prepped with chlorhexidine, and draped in the usual sterile fashion using maximum barrier technique (cap and mask, sterile gown, sterile gloves, large sterile sheet, hand hygiene and cutaneous antiseptic).  Antibiotic prophylaxis was provided with 2 g Ancef administered IV one hour prior to skin incision.  Local anesthesia was attained by infiltration with 1% lidocaine with epinephrine.  Ultrasound demonstrated patency of the right internal jugular vein, and this was documented with an image.  Under real-time ultrasound guidance, this vein was accessed with a 21 gauge micropuncture needle and image documentation was performed.  A small dermatotomy was made at the access site with an 11 scalpel.  A 0.018" wire was  advanced into the SVC and the access needle exchanged for a 61F micropuncture vascular sheath.  The 0.018" wire was then removed and a 0.035" wire advanced into the IVC.  An appropriate location for the subcutaneous reservoir was selected below the clavicle and an incision was made through the skin and underlying soft tissues.  The subcutaneous tissues were then dissected using a combination of blunt and sharp surgical technique and a pocket was formed.  A single lumen power injectable portacatheter was then tunneled through the subcutaneous tissues from the pocket to the dermatotomy and the port reservoir placed within the subcutaneous pocket.  The venous access site was then serially dilated and a peel away vascular sheath placed over the wire.  The wire was removed and the port catheter  advanced into position under fluoroscopic guidance. The catheter tip is positioned in the superior cavoatrial junction. This was documented with a spot image. The portacatheter was then tested and found to flush and aspirate well.  The port was flushed with saline followed by 100 units/mL heparinized saline.  The pocket was then closed in two layers using first subdermal inverted interrupted absorbable sutures followed by a running subcuticular suture.  The epidermis was then sealed with Dermabond. The dermatotomy at the venous access site was also closed with a single inverted subdermal suture and the epidermis sealed with Dermabond.  Complications:  None.  The patient tolerated the procedure well.  IMPRESSION:  Successful placement of a right IJapproach PowerPort with ultrasound and fluoroscopic guidance.  The catheter is ready for use.  Signed,  Sterling Big, MD Vascular & Interventional Radiologist [facility] Radiology   Original Report Authenticated By: Malachy Moan, M.D.   Dg Chest Port 1 View  12/03/2012   *RADIOLOGY REPORT*  Clinical Data: Status post biopsy of a mass in the dome of the liver.  Clinical  concern for pneumothorax.  PORTABLE CHEST - 1 VIEW  Comparison: Chest CT dated 11/13/2012.  Findings: No pneumothorax.  Normal sized heart and clear lungs. Right jugular port catheter tip in the inferior aspect of the superior vena cava.  Unremarkable bones. Probable PEG tube.  IMPRESSION: No pneumothorax or other acute abnormality.   Original Report Authenticated By: Beckie Salts, M.D.    ASSESSMENT AND PLAN:   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 Again in the clinic today with [name] and his daughter, we had a along discussion about his disease, diagnosis, clinical status and clinic stage. We also reviewed the pathology results. We explained the disease to him again and we informed and him that since the liver biopsy is positive; this will make his disease as a stage IV metastatic disease; therefore, his disease is not a curable disease and the goal of treatment would be with palliative intent. The treatment Would in the form of systemic chemotherapy was likely carboplatin/Taxol or FOLFOX regimen. However, since he is not able to swallow; he will be undergoing palliative radiotherapy first then systemic chemotherapy will follow. Giving the borderline performance status as well as malnutrition, I am hesitant to use chemotherapy as a radiosensitizer giving the possible added toxicity.  As mentioned above once he finishes his radiotherapy treatment, will plan to start his systemic chemotherapy. Followup appointment is made for patient be seen with Dr. Alcide Evener in 2 weeks. Will also schedule him 14 chemotherapy class. Patient has been already  followed by our dietitian team.  All questions were answered. The patient knows to call the clinic with any problems, questions or concerns. We can certainly see the patient much sooner if necessary.    Zachery Dakins, MD 08/12/20149:54 AM

## 2012-12-16 ENCOUNTER — Ambulatory Visit
Admission: RE | Admit: 2012-12-16 | Discharge: 2012-12-16 | Disposition: A | Discharge status: Home / Self Care | Payer: MEDICAID | Source: Ambulatory Visit | Attending: Radiation Oncology | Admitting: Radiation Oncology

## 2012-12-17 ENCOUNTER — Ambulatory Visit
Admission: RE | Admit: 2012-12-17 | Discharge: 2012-12-17 | Disposition: A | Discharge status: Home / Self Care | Payer: MEDICAID | Source: Ambulatory Visit | Attending: Radiation Oncology | Admitting: Radiation Oncology

## 2012-12-18 ENCOUNTER — Ambulatory Visit
Admission: RE | Admit: 2012-12-18 | Discharge: 2012-12-18 | Disposition: A | Discharge status: Home / Self Care | Payer: Self-pay | Source: Ambulatory Visit | Attending: Radiation Oncology | Admitting: Radiation Oncology

## 2012-12-21 ENCOUNTER — Encounter: Payer: Self-pay | Admitting: Radiation Oncology

## 2012-12-21 ENCOUNTER — Ambulatory Visit
Admission: RE | Admit: 2012-12-21 | Discharge: 2012-12-21 | Disposition: A | Discharge status: Home / Self Care | Payer: Self-pay | Source: Ambulatory Visit | Attending: Radiation Oncology | Admitting: Radiation Oncology

## 2012-12-21 ENCOUNTER — Ambulatory Visit
Admission: RE | Admit: 2012-12-21 | Discharge: 2012-12-21 | Disposition: A | Discharge status: Home / Self Care | Payer: MEDICAID | Source: Ambulatory Visit | Attending: Radiation Oncology | Admitting: Radiation Oncology

## 2012-12-21 ENCOUNTER — Telehealth: Payer: Self-pay | Admitting: *Deleted

## 2012-12-21 VITALS — BP 135/77 | HR 108 | Temp 98.2°F | Resp 16 | Ht 62.0 in | Wt 123.9 lb

## 2012-12-21 DIAGNOSIS — C155 Malignant neoplasm of lower third of esophagus: Secondary | ICD-10-CM

## 2012-12-21 MED ORDER — MORPHINE SULFATE 10 MG/5ML PO SOLN: 5.0000 mg | ORAL | Status: DC | PRN

## 2012-12-21 NOTE — Progress Notes (Signed)
   Weekly Management Note:  outpatient Current Dose:  18 Gy  Projected Dose: 30 Gy   Narrative:  The patient presents for routine under treatment assessment.  CBCT/MVCT images/Port film x-rays were reviewed.  The chart was checked. Swallowing saliva better, but afraid to swallow anything else.  Some soreness in lower/mid chest  Physical Findings:  height is 5\' 2"  (1.575 m) and weight is 123 lb 14.4 oz (56.201 kg). His temperature is 98.2 F (36.8 C). His blood pressure is 135/77 and his pulse is 108. His respiration is 16.  NAD.  CBC    Component Value Date/Time   WBC 11.7* 12/15/2012 0819   WBC 9.6 12/03/2012 0930   RBC 3.93* 12/15/2012 0819   RBC 4.10* 12/03/2012 0930   HGB 10.2* 12/15/2012 0819   HGB 11.2* 12/03/2012 0930   HCT 31.0* 12/15/2012 0819   HCT 33.3* 12/03/2012 0930   PLT 271 12/15/2012 0819   PLT 363 12/03/2012 0930   MCV 78.8* 12/15/2012 0819   MCV 81.2 12/03/2012 0930   MCH 25.9* 12/15/2012 0819   MCH 27.3 12/03/2012 0930   MCHC 32.9 12/15/2012 0819   MCHC 33.6 12/03/2012 0930   RDW 15.0* 12/15/2012 0819   RDW 14.5 12/03/2012 0930   LYMPHSABS 1.2 12/15/2012 0819   MONOABS 1.7* 12/15/2012 0819   EOSABS 0.1 12/15/2012 0819   BASOSABS 0.0 12/15/2012 0819     CMP     Component Value Date/Time   NA 136 12/15/2012 0819   NA 137 11/21/2012 0532   K 4.7 12/15/2012 0819   K 4.6 11/21/2012 0532   CL 102 11/21/2012 0532   CO2 26 12/15/2012 0819   CO2 31 11/21/2012 0532   GLUCOSE 141* 12/15/2012 0819   GLUCOSE 99 11/21/2012 0532   BUN 22.6 12/15/2012 0819   BUN 13 11/21/2012 0532   CREATININE 1.1 12/15/2012 0819   CREATININE 1.00 11/21/2012 0532   CALCIUM 10.8* 12/15/2012 0819   CALCIUM 9.8 11/21/2012 0532   PROT 8.2 12/15/2012 0819   ALBUMIN 3.2* 12/15/2012 0819   AST 14 12/15/2012 0819   ALT 10 12/15/2012 0819   ALKPHOS 104 12/15/2012 0819   BILITOT 0.39 12/15/2012 0819   GFRNONAA 68* 11/21/2012 0532   GFRAA 79* 11/21/2012 0532     Impression:  The patient is tolerating radiotherapy.    Plan:  Continue radiotherapy as planned. Morphine refilled. Will refer to swallowing therapist to assist with attempting restoration of swallowing function once RT complete.  -----------------------------------  Lonie Peak, MD

## 2012-12-21 NOTE — Progress Notes (Signed)
Douglas Jordan has received 8 fractions to his esophagus. He reports that his swallowing is better,but is not eating any solids nor food.  He is only swallowing saliva, because he is afraid to eat solids. His Peg tube is intact without any irritation.  Interpreter present.

## 2012-12-21 NOTE — Telephone Encounter (Signed)
CALLED PATIENT TO INFORM OF APPT. WITH DR. CARL SCHINKE ON 12-28-12- ARRIVAL TIME - 1 PM, SPOKE WITH PATIENT AND HE IS AWARE OF THIS APPT.

## 2012-12-21 NOTE — Progress Notes (Signed)
Patient has not received his medicaid insurance at this time.

## 2012-12-22 ENCOUNTER — Ambulatory Visit
Admission: RE | Admit: 2012-12-22 | Discharge: 2012-12-22 | Disposition: A | Discharge status: Home / Self Care | Payer: MEDICAID | Source: Ambulatory Visit | Attending: Radiation Oncology | Admitting: Radiation Oncology

## 2012-12-23 ENCOUNTER — Ambulatory Visit
Admission: RE | Admit: 2012-12-23 | Discharge: 2012-12-23 | Disposition: A | Discharge status: Home / Self Care | Payer: MEDICAID | Source: Ambulatory Visit | Attending: Radiation Oncology | Admitting: Radiation Oncology

## 2012-12-24 ENCOUNTER — Telehealth: Payer: Self-pay | Admitting: Oncology

## 2012-12-24 ENCOUNTER — Ambulatory Visit
Admission: RE | Admit: 2012-12-24 | Discharge: 2012-12-24 | Disposition: A | Discharge status: Home / Self Care | Payer: MEDICAID | Source: Ambulatory Visit | Attending: Radiation Oncology | Admitting: Radiation Oncology

## 2012-12-24 NOTE — Telephone Encounter (Signed)
talked to pt's daughter in law gave her appt for chemo class on 8/27

## 2012-12-25 ENCOUNTER — Encounter: Payer: Self-pay | Admitting: Radiation Oncology

## 2012-12-25 ENCOUNTER — Ambulatory Visit
Admission: RE | Admit: 2012-12-25 | Discharge: 2012-12-25 | Disposition: A | Discharge status: Home / Self Care | Payer: MEDICAID | Source: Ambulatory Visit | Attending: Radiation Oncology | Admitting: Radiation Oncology

## 2012-12-25 ENCOUNTER — Ambulatory Visit
Admission: RE | Admit: 2012-12-25 | Discharge: 2012-12-25 | Disposition: A | Discharge status: Home / Self Care | Payer: Self-pay | Source: Ambulatory Visit | Attending: Radiation Oncology | Admitting: Radiation Oncology

## 2012-12-25 VITALS — BP 133/75 | HR 115 | Temp 99.1°F | Ht 62.0 in | Wt 124.6 lb

## 2012-12-25 DIAGNOSIS — C155 Malignant neoplasm of lower third of esophagus: Secondary | ICD-10-CM

## 2012-12-25 NOTE — Progress Notes (Addendum)
Douglas Jordan has completed radiation therapy to his Esophagus.  He denies any pain today in the TX field but C/O low back age.  He reports, via an interpreter, that he is swallowing small amounts of liquid, but not eating any food by mouth.  His PEG tube is patent.  He is requesting a hospital bed because of the need to sit upright after feeding, but explained to him that if he just sits up 30 minutes following each feeding, then this is sufficient, and he does not need to sit upright all the time.  Note temp of 99.1 today.

## 2012-12-25 NOTE — Progress Notes (Signed)
   Weekly Management Note  Outpatient  Completed Radiotherapy. Total Dose: 30 Gy   Narrative:  The patient presents for routine under treatment assessment on last day of radiotherapy.  CBCT/MVCT images/Port film x-rays were reviewed.  The chart was checked.  Pain well controlled.  Still all nutrition by PEG. Appt with swallowing therapy next week.  Physical Findings:  height is 5\' 2"  (1.575 m) and weight is 124 lb 9.6 oz (56.518 kg). His temperature is 99.1 F (37.3 C). His blood pressure is 133/75 and his pulse is 115.   NAD, no skin irritation over torso  Impression:  The patient has tolerated radiotherapy.  Plan:  Routine follow-up in one month.   ________________________________   Lonie Peak, M.D.

## 2012-12-28 ENCOUNTER — Ambulatory Visit: Payer: MEDICAID

## 2012-12-28 ENCOUNTER — Ambulatory Visit: Payer: Self-pay

## 2012-12-28 NOTE — Progress Notes (Signed)
  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     Plan: The patient has completed radiation treatment. The patient will return to radiation oncology clinic for routine followup in one month. I advised them to call or return sooner if they have any questions or concerns related to their recovery or treatment.  -----------------------------------  Lonie Peak, MD

## 2012-12-29 ENCOUNTER — Ambulatory Visit: Payer: MEDICAID

## 2012-12-30 ENCOUNTER — Ambulatory Visit: Payer: MEDICAID

## 2012-12-30 ENCOUNTER — Ambulatory Visit (HOSPITAL_BASED_OUTPATIENT_CLINIC_OR_DEPARTMENT_OTHER): Payer: Self-pay | Admitting: Oncology

## 2012-12-30 ENCOUNTER — Telehealth: Payer: Self-pay

## 2012-12-30 ENCOUNTER — Encounter: Payer: Self-pay | Admitting: *Deleted

## 2012-12-30 ENCOUNTER — Other Ambulatory Visit: Payer: Self-pay

## 2012-12-30 ENCOUNTER — Ambulatory Visit (HOSPITAL_COMMUNITY)
Admission: RE | Admit: 2012-12-30 | Discharge: 2012-12-30 | Disposition: A | Discharge status: Home / Self Care | Payer: Self-pay | Source: Ambulatory Visit | Attending: Oncology | Admitting: Oncology

## 2012-12-30 ENCOUNTER — Other Ambulatory Visit: Payer: Self-pay | Admitting: Lab

## 2012-12-30 VITALS — BP 132/54 | HR 118 | Temp 98.8°F | Resp 18 | Ht 62.0 in | Wt 125.0 lb

## 2012-12-30 DIAGNOSIS — M19019 Primary osteoarthritis, unspecified shoulder: Secondary | ICD-10-CM | POA: Insufficient documentation

## 2012-12-30 DIAGNOSIS — J90 Pleural effusion, not elsewhere classified: Secondary | ICD-10-CM | POA: Insufficient documentation

## 2012-12-30 DIAGNOSIS — R0609 Other forms of dyspnea: Secondary | ICD-10-CM

## 2012-12-30 DIAGNOSIS — C155 Malignant neoplasm of lower third of esophagus: Secondary | ICD-10-CM | POA: Insufficient documentation

## 2012-12-30 DIAGNOSIS — R1310 Dysphagia, unspecified: Secondary | ICD-10-CM

## 2012-12-30 DIAGNOSIS — Z931 Gastrostomy status: Secondary | ICD-10-CM | POA: Insufficient documentation

## 2012-12-30 DIAGNOSIS — C787 Secondary malignant neoplasm of liver and intrahepatic bile duct: Secondary | ICD-10-CM

## 2012-12-30 MED ORDER — MORPHINE SULFATE 15 MG PO TABS
15.0000 mg | ORAL_TABLET | Freq: Once | ORAL | Status: AC
  Administered 2012-12-30: 15 mg

## 2012-12-30 NOTE — Addendum Note (Signed)
Encounter addended by: Lonie Peak, MD on: 12/30/2012  5:34 PM<BR>     Documentation filed: Notes Section

## 2012-12-30 NOTE — Progress Notes (Signed)
@  1315 Patient reports pain is better. To radiology for CXR.

## 2012-12-30 NOTE — Progress Notes (Signed)
Interpreter present for office visit today with patient. No family present. Called [facility] and was notified patient was discharged from their services. Unable to locate patient's pharmacy. Interpreter thinks it is [facility] on [address]. Called [facility] and he is not in their system for any location. Left message for daughter to call with pharmacy information.

## 2012-12-30 NOTE — Telephone Encounter (Signed)
gv and printed appt sched anda vs for pt...sent pt to radiology   °

## 2012-12-30 NOTE — Progress Notes (Signed)
   [facility]   INTERVAL HISTORY:   She returns for scheduled visit with an interpreter. He completed esophagus radiation on 12/25/2012. He complains of dysphagia and pain in the subxiphoid region. The pain is improved after taking liquid morphine. He is not eating. He relies on tube feedings for nutrition.  He complains of "weakness "and dyspnea. The dyspnea has been present for several months.  Objective:  Vital signs in last 24 hours:  Blood pressure 132/54, pulse 118, temperature 98.8 F (37.1 C), temperature source Oral, resp. rate 18, height 5\' 2"  (1.575 m), weight 125 lb (56.7 kg), SpO2 100.00%.    HEENT: No thrush or ulcers  Lymphatics: No cervical, supraclavicular, or axillary nodes  Resp: Lungs-diminished breath sounds over the right chest, no respiratory distress  Cardio: Regular rate and rhythm  GI: No hepatomegaly, tender in the subxiphoid region, no mass, gastrostomy tube site without evidence of infection  Vascular: No leg edema   Portacath/PICC-without erythema  Lab Results:  Lab Results  Component Value Date   WBC 11.7* 12/15/2012   HGB 10.2* 12/15/2012   HCT 31.0* 12/15/2012   MCV 78.8* 12/15/2012   PLT 271 12/15/2012  ANC 8.7 Calcium 10.8, albumin 3.2, BUN 22.6, creatinine 1.1   Medications: I have reviewed the patient's current medications.  Assessment/Plan:  1. Metastatic squamous cell carcinoma the esophagus, metastatic disease to the liver confirmed on a biopsy 12/03/2012 -PET scan 12/01/2012 confirmed a hypermetabolic distal esophagus mass, hypermetabolic right paratracheal lymph node, hypermetabolic right liver lesion, and hypermetabolic upper abdominal lymphadenopathy.  2. Dysphagia secondary to #1, status post placement of a gastrostomy tube in radiology on 11/17/2012, he continues tube feedings  3. Odynophagia/upper abdominal pain secondary to the esophagus mass, radiation esophagitis, and adenopathy-he will continue Duragesic and  Roxanol  4. Mildly elevated calcium when he was here on April 2014-we will ask him to return for a chemistry panel within the next few days  5. The dyspnea-etiology unclear. He will be referred for a chest x-ray today.  Disposition:  He completed palliative radiation to the esophagus last week. He continues to have dysphagia and pain. He is not eating or drinking. He does not appear to be a candidate for chemotherapy at present. He received teaching on weekly Taxol/carboplatin.  He will be referred for a chest x-ray today to evaluate the dyspnea.  Mr. Raborn will return for an office visit on 01/14/2013. He was given contact numbers to reach [place] in the interim as needed.   Thornton Papas, MD  12/30/2012  3:02 PM

## 2012-12-31 ENCOUNTER — Ambulatory Visit: Payer: MEDICAID

## 2012-12-31 ENCOUNTER — Telehealth: Payer: Self-pay | Admitting: Oncology

## 2012-12-31 NOTE — Telephone Encounter (Signed)
Talked to pt's son and gave him appt for lab tomorrow 01/01/13, [phone]

## 2013-01-01 ENCOUNTER — Ambulatory Visit: Payer: Self-pay

## 2013-01-01 ENCOUNTER — Other Ambulatory Visit: Payer: Self-pay | Admitting: Lab

## 2013-01-01 ENCOUNTER — Ambulatory Visit: Payer: MEDICAID

## 2013-01-05 ENCOUNTER — Ambulatory Visit: Payer: MEDICAID

## 2013-01-05 ENCOUNTER — Other Ambulatory Visit: Payer: Self-pay

## 2013-01-06 ENCOUNTER — Telehealth: Payer: Self-pay | Admitting: Dietician

## 2013-01-06 ENCOUNTER — Ambulatory Visit: Payer: MEDICAID

## 2013-01-07 ENCOUNTER — Ambulatory Visit: Payer: MEDICAID

## 2013-01-08 ENCOUNTER — Ambulatory Visit: Payer: MEDICAID

## 2013-01-11 ENCOUNTER — Ambulatory Visit: Payer: MEDICAID

## 2013-01-12 ENCOUNTER — Ambulatory Visit: Payer: MEDICAID

## 2013-01-13 ENCOUNTER — Ambulatory Visit: Payer: MEDICAID

## 2013-01-14 ENCOUNTER — Telehealth: Payer: Self-pay | Admitting: Oncology

## 2013-01-14 ENCOUNTER — Encounter: Payer: Self-pay | Admitting: Nurse Practitioner

## 2013-01-14 ENCOUNTER — Ambulatory Visit (HOSPITAL_BASED_OUTPATIENT_CLINIC_OR_DEPARTMENT_OTHER): Payer: Self-pay | Admitting: Lab

## 2013-01-14 ENCOUNTER — Ambulatory Visit: Payer: Self-pay | Admitting: Nutrition

## 2013-01-14 ENCOUNTER — Ambulatory Visit (HOSPITAL_BASED_OUTPATIENT_CLINIC_OR_DEPARTMENT_OTHER): Payer: Self-pay

## 2013-01-14 ENCOUNTER — Telehealth: Payer: Self-pay | Admitting: Nurse Practitioner

## 2013-01-14 ENCOUNTER — Ambulatory Visit: Payer: MEDICAID

## 2013-01-14 ENCOUNTER — Ambulatory Visit (HOSPITAL_BASED_OUTPATIENT_CLINIC_OR_DEPARTMENT_OTHER): Payer: Self-pay | Admitting: Nurse Practitioner

## 2013-01-14 VITALS — BP 157/83 | HR 111 | Temp 98.4°F | Resp 20 | Ht 62.0 in | Wt 122.9 lb

## 2013-01-14 DIAGNOSIS — R0609 Other forms of dyspnea: Secondary | ICD-10-CM

## 2013-01-14 DIAGNOSIS — R1310 Dysphagia, unspecified: Secondary | ICD-10-CM

## 2013-01-14 DIAGNOSIS — C155 Malignant neoplasm of lower third of esophagus: Secondary | ICD-10-CM

## 2013-01-14 DIAGNOSIS — C787 Secondary malignant neoplasm of liver and intrahepatic bile duct: Secondary | ICD-10-CM

## 2013-01-14 DIAGNOSIS — B370 Candidal stomatitis: Secondary | ICD-10-CM

## 2013-01-14 DIAGNOSIS — R1084 Generalized abdominal pain: Secondary | ICD-10-CM

## 2013-01-14 HISTORY — DX: Hypercalcemia: E83.52

## 2013-01-14 LAB — COMPREHENSIVE METABOLIC PANEL (CC13)
ALT: 18 U/L (ref 0–55)
AST: 23 U/L (ref 5–34)
Albumin: 2.8 g/dL — ABNORMAL LOW (ref 3.5–5.0)
Alkaline Phosphatase: 123 U/L (ref 40–150)
BUN: 34.6 mg/dL — ABNORMAL HIGH (ref 7.0–26.0)
CO2: 31 mEq/L — ABNORMAL HIGH (ref 22–29)
Calcium: 12.5 mg/dL — ABNORMAL HIGH (ref 8.4–10.4)
Chloride: 97 mEq/L — ABNORMAL LOW (ref 98–109)
Creatinine: 1.3 mg/dL (ref 0.7–1.3)
Glucose: 138 mg/dl (ref 70–140)
Potassium: 4.4 mEq/L (ref 3.5–5.1)
Sodium: 134 mEq/L — ABNORMAL LOW (ref 136–145)
Total Bilirubin: 0.35 mg/dL (ref 0.20–1.20)
Total Protein: 9.1 g/dL — ABNORMAL HIGH (ref 6.4–8.3)

## 2013-01-14 MED ORDER — FENTANYL 25 MCG/HR TD PT72: 1.0000 | MEDICATED_PATCH | TRANSDERMAL | Status: DC

## 2013-01-14 MED ORDER — SODIUM CHLORIDE 0.9 % IV SOLN
INTRAVENOUS | Status: DC
  Administered 2013-01-14: 17:00:00 via INTRAVENOUS

## 2013-01-14 MED ORDER — SODIUM CHLORIDE 0.9 % IJ SOLN
10.0000 mL | INTRAMUSCULAR | Status: DC | PRN
Start: 2013-01-14 — End: 2013-01-14
  Administered 2013-01-14: 10 mL
  Filled 2013-01-14: qty 10

## 2013-01-14 MED ORDER — CLOTRIMAZOLE 10 MG MT TROC: 10.0000 mg | Freq: Four times a day (QID) | OROMUCOSAL | Status: DC

## 2013-01-14 MED ORDER — FLUCONAZOLE 40 MG/ML PO SUSR: 100.0000 mg | Freq: Every day | ORAL | Status: DC

## 2013-01-14 MED ORDER — HEPARIN SOD (PORK) LOCK FLUSH 100 UNIT/ML IV SOLN
500.0000 [IU] | Freq: Once | INTRAVENOUS | Status: AC | PRN
  Administered 2013-01-14: 500 [IU]
  Filled 2013-01-14: qty 5

## 2013-01-14 MED ORDER — ZOLEDRONIC ACID 4 MG/100ML IV SOLN
4.0000 mg | Freq: Once | INTRAVENOUS | Status: AC
  Administered 2013-01-14: 4 mg via INTRAVENOUS
  Filled 2013-01-14: qty 100

## 2013-01-14 NOTE — Telephone Encounter (Signed)
I contacted Mr. Schertzer son by phone and explained to him regarding the elevated calcium and the need for IV fluids and Zometa. He will bring his father to the office now.

## 2013-01-14 NOTE — Telephone Encounter (Signed)
GV AND PRINTED APPT SCHED AND AVS FOR PT FOR oct...SENT PT BACK TO THE LAB

## 2013-01-14 NOTE — Patient Instructions (Addendum)
Hypercalcemia Hypercalcemia means the calcium in your blood is too high. A level above 10.5 milligrams per deciliter of blood is considered high. Calcium in our blood is important for the control of many things, such as:  Blood clotting.  Conducting of nerve impulses.  Muscle contraction.  Maintaining teeth and bone health.  Other body functions. In the bloodstream, calcium maintains a constant balance with another mineral, phosphate. Calcium is absorbed into the body through the small intestine. This is helped by Vitamin D. Calcium levels are maintained mostly by vitamin D and a hormone (parathyroid hormone). But the kidneys also help. Hypercalcemia can happen when the concentration of calcium is too high for the kidneys to maintain balance. The body maintains a balance between the calcium we eat and the calcium already in our body. If calcium intake is increased or we cannot use calcium properly, there may be problems. Some common sources of calcium are:   Dairy products.  Nuts.  Eggs.  Whole grains.  Legumes.  Green leafy vegetables. CAUSES There are many causes of this condition, but some common ones are:  Hyperparathyroidism. This is an over activity of the parathyroid gland.  Cancers of the breast, kidney, lung, head and neck are common causes of calcium increases.  Medications that cause you to urinate more often (diuretics), nausea, vomiting and diarrhea also increase the calcium in the blood.  Overuse of calcium-containing antacids. SYMPTOMS  Many patients with mild hypercalcemia have no symptoms. For those with symptoms common problems include:  Loss of appetite.  Constipation.  Increased thirst.  Heart rhythm changes.  Abnormal thinking.  Nausea.  Abdominal pain.  Kidney stones.  Mood swings.  Coma and death when severe.  Vomiting.  Increased urination.  High blood pressure.  Confusion. DIAGNOSIS   Your caregiver will do a medical history  and perform a physical exam on you.  Calcium and parathyroid hormone (PTH) may be measured with a blood test. TREATMENT   The treatment depends on the calcium level and what is causing the higher level. Hypercalcemia can be lifethreatening. Fast lowering of the calcium level may be necessary.  With normal kidney function, fluids can be given by vein to clear the excess calcium. Hemodialysis works well to reduce dangerous calcium levels if there is poor kidney function. This is a procedure in which a machine is used to filter out unwanted substances. The blood is then returned to the body.  Drugs, such as diuretics, can be given after adequate fluid intake is established. These medications help the kidneys get rid of extra calcium. Drugs that lessen (inhibit) bone loss are helpful in gaining long-term control. Phosphate pills help lower high calcium levels caused by a low supply of phosphate. Anti-inflammatory agents such as steroids are helpful with some cancers and toxic levels of vitamin D.  Treatment of the underlying cause of the hypercalcemia will also correct the imbalance. Hyperparathyroidism is usually treated by surgical removal of one or more of the parathyroid glands and any tissue, other than the glands themselves, that is producing too much hormone.  The hypercalcemia caused by cancer is difficult to treat without controlling the cancer. Symptoms can be improved with fluids and drug therapy as outlined above. PROGNOSIS   Surgery to remove the parathyroid glands is usually successful. This also depends on the amount of damage to the kidneys and whether or not it can be treated.  Mild hypercalcemia can be controlled with good fluid intake and the use of effective medications.    Hypercalcemia often develops as a late complication of cancer. The expected outlook is poor without effective anticancer therapy. PREVENTION   If you are at risk for developing hypercalcemia, be familiar with  early symptoms. Report these to your caregiver.  Good fluid intake (up to four quarts of liquid a day if possible) is helpful.  Try to control nausea and vomiting, and treat fevers to avoid dehydration.  Lowering the amount of calcium in your diet is not necessary. High blood calcium reduces absorption of calcium in the intestine.  Stay as active as possible. SEEK IMMEDIATE MEDICAL CARE IF:   You develop chest pain, sweating, or shortness of breath.  You get confused, feel faint or pass out.  You develop severe nausea and vomiting. MAKE SURE YOU:   Understand these instructions.  Will watch your condition.  Will get help right away if you are not doing well or get worse. Document Released: 07/06/2004 Document Revised: 07/15/2011 Document Reviewed: 04/17/2010 [employer] Patient Information 2014 [employer].  

## 2013-01-14 NOTE — Progress Notes (Signed)
I met with the patient and his interpreter and his son-in-law today.  Reviewing nutrition information.  Patient is only taking water by mouth.  He has not tried any other liquids or soft foods.  Patient denies pain with swallowing.  He only tolerates one can of Osmolite 1.2 via PEG, 4 times a day.  Patient reports he gets full and wants to throw up if he uses more tube feeding.  Patient's weight decreased slightly to 122.9 pounds from 125 pounds.  Weight is stable overall.  Patient is approximately 3 weeks out from radiation therapy.  Nutrition diagnosis: Inadequate oral intake continues.  Intervention: Patient was educated to begin taking more liquids by mouth including water, milk and ice cream.  These are foods he enjoys.  I also educated patient and son-in-law on the importance of beginning soft, moist foods in small amounts throughout the day.  Patient is to continue Osmolite 1.2 via feeding tube.  He will continue one can 4 times a day in attempt to workup to 1-1/2 cans 4 times a day if oral intake is inadequate.  Patient verbalizes that he is forgetful and wishes this information to be reinforced with his family.  Therefore, I have met with patient's son-in-law in detail to describe diet advancement.  Monitoring, evaluation, goals: Patient is tolerating tube feedings and has had weight stabilization.  He will begin to add and liquids and soft foods.  Next visit: Patient was encouraged to have his family contact me with questions.  They have my contact information.  No followup scheduled at this time, however, I will monitor patient's treatment plan and attempt to see him when he comes back for chemotherapy.

## 2013-01-14 NOTE — Progress Notes (Addendum)
OFFICE PROGRESS NOTE  Interval history:  Douglas Jordan is an 79 year old man with metastatic esophagus cancer. He completed radiation to the esophagus on 12/25/2012. He is seen today for scheduled followup. He is accompanied by an interpreter.  He continues tube feedings. Other than attempting to drink some water earlier today he has had no oral intake. He was able to swallow the water without difficulty/pain. He feels like his tongue has a thick coating. He complains of pain surrounding the feeding tube as well as generalized abdominal pain. The abdominal pain worsens when he coughs. Liquid morphine adequately relieves the pain. He continues to feel weak. He is spending the majority of the day in bed. He denies shortness of breath. Bowels moving regularly. No urinary complaints.   Objective: Blood pressure 157/83, pulse 111, temperature 98.4 F (36.9 C), temperature source Oral, resp. rate 20, height 5\' 2"  (1.575 m), weight 122 lb 14.4 oz (55.747 kg).  White coating over tongue. Mucous membranes appear moist. Skin turgor is normal. Breath sounds globally diminished. No wheezes or rales. Regular cardiac rhythm. Port-A-Cath site is without erythema. Abdomen is soft, tender over the right upper quadrant. No hepatomegaly. Gastrostomy tube site is without erythema or drainage. No leg edema. Calves nontender.  Lab Results: Lab Results  Component Value Date   WBC 11.7* 12/15/2012   HGB 10.2* 12/15/2012   HCT 31.0* 12/15/2012   MCV 78.8* 12/15/2012   PLT 271 12/15/2012    Chemistry:    Chemistry      Component Value Date/Time   NA 134* 01/14/2013 1221   NA 137 11/21/2012 0532   K 4.4 01/14/2013 1221   K 4.6 11/21/2012 0532   CL 102 11/21/2012 0532   CO2 31* 01/14/2013 1221   CO2 31 11/21/2012 0532   BUN 34.6* 01/14/2013 1221   BUN 13 11/21/2012 0532   CREATININE 1.3 01/14/2013 1221   CREATININE 1.00 11/21/2012 0532      Component Value Date/Time   CALCIUM 12.5* 01/14/2013 1221   CALCIUM 9.8 11/21/2012  0532   ALKPHOS 123 01/14/2013 1221   AST 23 01/14/2013 1221   ALT 18 01/14/2013 1221   BILITOT 0.35 01/14/2013 1221       Studies/Results: Dg Chest 2 View  12/30/2012   *RADIOLOGY REPORT*  Clinical Data: Dyspnea.  Evaluate for pleural effusion or pneumothorax.Esophageal cancer.  CHEST - 2 VIEW  Comparison: 12/03/2012  Findings: Gastrostomy tube.  A right-sided Port-A-Cath which terminates at the low SVC.  Right glenohumeral joint osteoarthritis. Midline trachea.  Borderline cardiomegaly. Right paratracheal soft tissue fullness likely relates to hypermetabolic adenopathy on 12/01/2012.  A tiny effusion is identified on the lateral view.  Favored to be right-sided on the frontal.  Right hemidiaphragm elevation with mild volume loss at the right lung base.  Left lung clear. Remote left rib fractures.  IMPRESSION: A small unilateral pleural effusion, favored to be right-sided.  Right hemidiaphragm elevation with adjacent volume loss/atelectasis.  Right paratracheal soft tissue fullness, likely corresponding to adenopathy on the prior PET.   Original Report Authenticated By: Jeronimo Greaves, M.D.    Medications: I have reviewed the patient's current medications.  Assessment/Plan:  1. Metastatic squamous cell carcinoma the esophagus, metastatic disease to the liver confirmed on a biopsy 12/03/2012.  -PET scan 12/01/2012 confirmed a hypermetabolic distal esophagus mass, hypermetabolic right paratracheal lymph node, hypermetabolic right liver lesion, and hypermetabolic upper abdominal lymphadenopathy. -completed palliative radiation 12/25/2012.  2. Dysphagia secondary to #1, status post placement of a gastrostomy tube  in radiology on 11/17/2012, he continues tube feedings.  3. Odynophagia/upper abdominal pain secondary to the esophagus mass, radiation esophagitis, and adenopathy. He continues to have abdominal pain.  4. Hypercalcemia of malignancy-Mildly elevated calcium when he was here 12/15/2012. Further  elevated today. 5. Dyspnea-etiology unclear. Chest x-ray 12/30/2012 showed a small unilateral pleural effusion favored to be right-sided; right hemidiaphragm elevation with adjacent volume loss/atelectasis; right paratracheal soft tissue fullness. He denies dyspnea at today's visit.  Disposition-he continues to have a poor performance status and does not appear to be a candidate for chemotherapy at present. He met with the [facility] dietitian at today's visit and will begin oral intake as tolerated.  He continues to have abdominal pain. He is on a Duragesic patch and takes morphine as needed.  When here on 12/15/2012 the calcium returned mildly elevated. He did not return for repeat labs as scheduled. The calcium is further elevated today. The creatinine is also increased. He had already left the office when we received these results. We will attempt to contact him and bring him back to the office today for Zometa and a liter of IV fluids with repeat labs on 01/15/2013.  He will return for a followup visit on 02/04/2013. As noted above we will obtain a repeat calcium on 01/15/2013.  Patient seen with Dr. Truett Perna.  Lonna Cobb ANP/GNP-BC   This was a shared visit with Lonna Cobb. Douglas Jordan was interviewed and examined. He is not eating and continues to have abdominal pain. We encouraged him to begin a diet. He met with the nutritionist today.  Douglas Jordan has developed significant hypercalcemia. We will ask him to return for intravenous hydration and Zometa today. We will repeat a calcium level on 01/15/2013. He will be scheduled for an office visit in approximately 3 weeks.  I asked the interpreter she requests that his family return for the next office visit. We will discuss systemic chemotherapy options.  Mancel Bale, M.D.

## 2013-01-15 ENCOUNTER — Ambulatory Visit: Payer: MEDICAID

## 2013-01-15 ENCOUNTER — Telehealth: Payer: Self-pay | Admitting: Oncology

## 2013-01-15 ENCOUNTER — Other Ambulatory Visit: Payer: Self-pay | Admitting: *Deleted

## 2013-01-15 ENCOUNTER — Other Ambulatory Visit: Payer: Self-pay | Admitting: Nurse Practitioner

## 2013-01-15 DIAGNOSIS — C155 Malignant neoplasm of lower third of esophagus: Secondary | ICD-10-CM

## 2013-01-15 MED ORDER — OMEPRAZOLE 2 MG/ML ORAL SUSPENSION: 20.0000 mg | Freq: Every day | ORAL | Status: DC

## 2013-01-15 NOTE — Telephone Encounter (Signed)
Talked to pt's son and gave him appt for 9/15 lab only

## 2013-01-18 ENCOUNTER — Other Ambulatory Visit (HOSPITAL_BASED_OUTPATIENT_CLINIC_OR_DEPARTMENT_OTHER): Payer: Self-pay | Admitting: Lab

## 2013-01-18 ENCOUNTER — Other Ambulatory Visit: Payer: Self-pay | Admitting: Nurse Practitioner

## 2013-01-18 DIAGNOSIS — C155 Malignant neoplasm of lower third of esophagus: Secondary | ICD-10-CM

## 2013-01-18 LAB — COMPREHENSIVE METABOLIC PANEL (CC13)
ALT: 37 U/L (ref 0–55)
AST: 42 U/L — ABNORMAL HIGH (ref 5–34)
Calcium: 10.1 mg/dL (ref 8.4–10.4)
Chloride: 101 mEq/L (ref 98–109)
Creatinine: 1.5 mg/dL — ABNORMAL HIGH (ref 0.7–1.3)
Sodium: 134 mEq/L — ABNORMAL LOW (ref 136–145)
Total Bilirubin: 0.3 mg/dL (ref 0.20–1.20)
Total Protein: 8.7 g/dL — ABNORMAL HIGH (ref 6.4–8.3)

## 2013-01-19 ENCOUNTER — Ambulatory Visit: Payer: MEDICAID

## 2013-01-20 ENCOUNTER — Telehealth: Payer: Self-pay | Admitting: Oncology

## 2013-01-20 ENCOUNTER — Ambulatory Visit: Payer: MEDICAID

## 2013-01-20 NOTE — Telephone Encounter (Signed)
S/w the pt's grandson and he is aware of the lab appt on 12/25/2012@2 :15pm

## 2013-01-21 ENCOUNTER — Ambulatory Visit: Payer: MEDICAID

## 2013-01-25 ENCOUNTER — Other Ambulatory Visit (HOSPITAL_BASED_OUTPATIENT_CLINIC_OR_DEPARTMENT_OTHER): Payer: Self-pay | Admitting: Lab

## 2013-01-25 DIAGNOSIS — C155 Malignant neoplasm of lower third of esophagus: Secondary | ICD-10-CM

## 2013-01-25 LAB — COMPREHENSIVE METABOLIC PANEL (CC13)
CO2: 24 mEq/L (ref 22–29)
Creatinine: 1.2 mg/dL (ref 0.7–1.3)
Glucose: 143 mg/dl — ABNORMAL HIGH (ref 70–140)
Total Bilirubin: 0.36 mg/dL (ref 0.20–1.20)

## 2013-01-28 ENCOUNTER — Encounter: Payer: Self-pay | Admitting: Radiation Oncology

## 2013-01-29 ENCOUNTER — Ambulatory Visit
Admission: RE | Admit: 2013-01-29 | Discharge: 2013-01-29 | Disposition: A | Discharge status: Home / Self Care | Payer: Self-pay | Source: Ambulatory Visit | Attending: Radiation Oncology | Admitting: Radiation Oncology

## 2013-01-29 ENCOUNTER — Encounter: Payer: Self-pay | Admitting: Radiation Oncology

## 2013-01-29 VITALS — BP 167/80 | HR 118 | Temp 98.1°F | Ht 62.0 in | Wt 122.3 lb

## 2013-01-29 DIAGNOSIS — C155 Malignant neoplasm of lower third of esophagus: Secondary | ICD-10-CM

## 2013-01-29 DIAGNOSIS — R300 Dysuria: Secondary | ICD-10-CM

## 2013-01-29 HISTORY — DX: Personal history of irradiation: Z92.3

## 2013-01-29 LAB — URINALYSIS, MICROSCOPIC - CHCC
Bilirubin (Urine): NEGATIVE
Blood: NEGATIVE
Glucose: NEGATIVE mg/dL
Specific Gravity, Urine: 1.02 (ref 1.003–1.035)
WBC, UA: NEGATIVE (ref 0–2)

## 2013-01-29 MED ORDER — FENTANYL 25 MCG/HR TD PT72: 1.0000 | MEDICATED_PATCH | TRANSDERMAL | Status: DC

## 2013-01-29 MED ORDER — METOCLOPRAMIDE HCL 5 MG/5ML PO SOLN: ORAL | Status: DC

## 2013-01-29 NOTE — Progress Notes (Signed)
Obtained urine for U/A and culture as ordered by Dr. Basilio Cairo

## 2013-01-29 NOTE — Progress Notes (Signed)
Douglas Jordan here today for assessment following radiation therapy for esophageal cancer which completed on 12/25/12.  He reports that he is trying to eat ,but small amounts, but at times it feels as if food is stuck in his throat and causes him to have hiccups.  He is c/o ineffective bowel evacuation despite taking Miralax BID, but is unable to swallow  Dulcolax.  His abdomen is slightly firm to touch and states he gets full faster when he instills his enteral nutrition.  He c/o soreness at Peg site and there is drainage at the site - yellowish in color.   He also reports that it is difficult to initiate urination and he is voiding small amounts of dark yellow urine and experiences  "burning",  but without any odor.

## 2013-01-29 NOTE — Progress Notes (Signed)
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 He reports that he is trying to eat but only tolerates water.  He c/o ineffective bowel evacuation despite taking Miralax BID, and colace.  He is reluctant to use enemas.  He also has been coping with hypercalcemia. He has received Zometa and IVF in med/onc but no chemotherapy thus far. He states he gets full faster when he instills his enteral nutrition. Feels like the shakes sit in his stomach. He c/o abdominal soreness, worse with constipation.  PEG site soreness is stable.  No significant esophagitis. He also reports that it is difficult to initiate urination and he is voiding small amounts of dark yellow urine and experiences "burning", but without any odor.                               ALLERGIES:  has No Known Allergies.  Meds: Current Outpatient Prescriptions  Medication Sig Dispense Refill  . bisacodyl (DULCOLAX) 10 MG suppository Place 1 suppository (10 mg total) rectally as needed for constipation.  50 suppository  3  . docusate (COLACE) 50 MG/5ML liquid Take 10 mLs (100 mg total) by mouth 2 (two) times daily.  480 mL  3  . feeding supplement (PRO-STAT SUGAR FREE 64) LIQD Place 30 mLs into feeding tube 2 (two) times daily.  900 mL  30  . fentaNYL (DURAGESIC - DOSED MCG/HR) 25 MCG/HR patch Place 1 patch (25 mcg total) onto the skin every 3 (three) days.  10 patch  0  . morphine 10 MG/5ML solution Place 2.5 mL (5 mg total) into feeding tube every 4 (four) hours as needed for pain.  500 mL  0  . Nutritional Supplements  (FEEDING SUPPLEMENT, OSMOLITE 1.2 CAL,) LIQD Place 355 mLs into feeding tube 4 (four) times daily.  1500 mL  30  . omeprazole (PRILOSEC) 2 mg/mL SUSP Place 10 mLs (20 mg total) into feeding tube daily.  100 mL  3  . polyethylene glycol powder (MIRALAX) powder Take 17 g by mouth daily. Follow instructions on container, mix with water and inject in tube. Use twice daily until bowel movement, then daily to prevent constipation.  255 g  5  . Probiotic Product (PROBIOTIC DAILY PO) Take 1 tablet by mouth once.      . Water For Irrigation, Sterile (FREE WATER) SOLN Place 200 mLs into feeding tube daily.      . clotrimazole (MYCELEX) 10 MG troche Take 1 tablet (10 mg total) by mouth 4 (four) times daily. Take for 5 days  20 tablet  0  . metoCLOPramide (REGLAN) 5 MG/5ML solution Instill 5mL in PEG - 30 min before feedings or meals - up to QID  240 mL  2   No current facility-administered medications for this encounter.    Physical Findings: The patient is in no acute distress. Patient is alert and oriented.  height is 5\' 2"  (1.575 m) and weight is 122 lb 4.8 oz (55.475 kg). His temperature is 98.1 F (36.7 C). His blood pressure is 167/80 and his  pulse is 118. .   Mild dryness of skin over torso/back.  PEG site - minimal discharge, no    Lab Findings: Lab Results  Component Value Date   WBC 11.7* 12/15/2012   HGB 10.2* 12/15/2012   HCT 31.0* 12/15/2012   MCV 78.8* 12/15/2012   PLT 271 12/15/2012    Radiographic Findings: Dg Chest 2 View  12/30/2012   *RADIOLOGY REPORT*  Clinical Data: Dyspnea.  Evaluate for pleural effusion or pneumothorax.Esophageal cancer.  CHEST - 2 VIEW  Comparison: 12/03/2012  Findings: Gastrostomy tube.  A right-sided Port-A-Cath which terminates at the low SVC.  Right glenohumeral joint osteoarthritis. Midline trachea.  Borderline cardiomegaly. Right paratracheal soft tissue fullness likely relates to hypermetabolic adenopathy on 12/01/2012.  A tiny effusion is identified on  the lateral view.  Favored to be right-sided on the frontal.  Right hemidiaphragm elevation with mild volume loss at the right lung base.  Left lung clear. Remote left rib fractures.  IMPRESSION: A small unilateral pleural effusion, favored to be right-sided.  Right hemidiaphragm elevation with adjacent volume loss/atelectasis.  Right paratracheal soft tissue fullness, likely corresponding to adenopathy on the prior PET.   Original Report Authenticated By: Jeronimo Greaves, M.D.    Impression/Plan: One of his main complaints is constipation and early satiety. It seems that he may benefit from Reglan so we will give this a try. I'm giving him a Rx for a liquid form of this to instill 30 minutes before meals or feedings through his PEG tube. We talked about extrapyramidal side effects which could occur and which should prompt him to discontinue the medication if they occur. I also suggested trying glycerin suppositories to address  his constipation, these may be easier to tolerate than enemas. He will continue his Colace. Hyper calcemia could be playing a role in symptoms.   UA obtained today- negative.   Refill provided today for duragesic. I will see him back on a when necessary basis.    I spent 20 minutes face to face with the patient and more than 50% of that time was spent in counseling and/or coordination of care. _____________________________________   Lonie Peak, MD

## 2013-01-29 NOTE — Progress Notes (Signed)
Covered peg tube insertion site with drain sponge.  Secured with paper tape.

## 2013-01-30 LAB — URINE CULTURE

## 2013-02-01 ENCOUNTER — Other Ambulatory Visit: Payer: Self-pay | Admitting: Nurse Practitioner

## 2013-02-01 DIAGNOSIS — C155 Malignant neoplasm of lower third of esophagus: Secondary | ICD-10-CM

## 2013-02-03 ENCOUNTER — Telehealth: Payer: Self-pay | Admitting: *Deleted

## 2013-02-03 ENCOUNTER — Telehealth: Payer: Self-pay | Admitting: Oncology

## 2013-02-03 NOTE — Telephone Encounter (Signed)
Per staff message and POF I have scheduled appts.  JMW  

## 2013-02-03 NOTE — Telephone Encounter (Signed)
interpreter LELE # [phone] called and advised pt son on appt time for tomorrow

## 2013-02-04 ENCOUNTER — Ambulatory Visit (HOSPITAL_BASED_OUTPATIENT_CLINIC_OR_DEPARTMENT_OTHER): Payer: Self-pay

## 2013-02-04 ENCOUNTER — Other Ambulatory Visit: Payer: Self-pay | Admitting: Nurse Practitioner

## 2013-02-04 ENCOUNTER — Other Ambulatory Visit (HOSPITAL_BASED_OUTPATIENT_CLINIC_OR_DEPARTMENT_OTHER): Payer: Self-pay | Admitting: Lab

## 2013-02-04 ENCOUNTER — Ambulatory Visit (HOSPITAL_COMMUNITY)
Admission: RE | Admit: 2013-02-04 | Discharge: 2013-02-04 | Disposition: A | Discharge status: Home / Self Care | Payer: Self-pay | Source: Ambulatory Visit | Attending: Oncology | Admitting: Oncology

## 2013-02-04 ENCOUNTER — Other Ambulatory Visit: Payer: Self-pay | Admitting: *Deleted

## 2013-02-04 ENCOUNTER — Ambulatory Visit (HOSPITAL_BASED_OUTPATIENT_CLINIC_OR_DEPARTMENT_OTHER): Payer: Self-pay | Admitting: Oncology

## 2013-02-04 ENCOUNTER — Ambulatory Visit: Payer: Self-pay

## 2013-02-04 VITALS — BP 160/74 | HR 123 | Temp 98.3°F | Resp 20 | Ht 62.0 in | Wt 123.2 lb

## 2013-02-04 DIAGNOSIS — C155 Malignant neoplasm of lower third of esophagus: Secondary | ICD-10-CM

## 2013-02-04 DIAGNOSIS — C771 Secondary and unspecified malignant neoplasm of intrathoracic lymph nodes: Secondary | ICD-10-CM | POA: Insufficient documentation

## 2013-02-04 DIAGNOSIS — R109 Unspecified abdominal pain: Secondary | ICD-10-CM

## 2013-02-04 DIAGNOSIS — C787 Secondary malignant neoplasm of liver and intrahepatic bile duct: Secondary | ICD-10-CM

## 2013-02-04 DIAGNOSIS — C159 Malignant neoplasm of esophagus, unspecified: Secondary | ICD-10-CM | POA: Insufficient documentation

## 2013-02-04 DIAGNOSIS — R1310 Dysphagia, unspecified: Secondary | ICD-10-CM

## 2013-02-04 DIAGNOSIS — R188 Other ascites: Secondary | ICD-10-CM | POA: Insufficient documentation

## 2013-02-04 DIAGNOSIS — R918 Other nonspecific abnormal finding of lung field: Secondary | ICD-10-CM | POA: Insufficient documentation

## 2013-02-04 DIAGNOSIS — J90 Pleural effusion, not elsewhere classified: Secondary | ICD-10-CM | POA: Insufficient documentation

## 2013-02-04 DIAGNOSIS — Z931 Gastrostomy status: Secondary | ICD-10-CM | POA: Insufficient documentation

## 2013-02-04 LAB — COMPREHENSIVE METABOLIC PANEL (CC13)
AST: 19 U/L (ref 5–34)
Albumin: 2.7 g/dL — ABNORMAL LOW (ref 3.5–5.0)
Alkaline Phosphatase: 155 U/L — ABNORMAL HIGH (ref 40–150)
Potassium: 4 mEq/L (ref 3.5–5.1)
Sodium: 130 mEq/L — ABNORMAL LOW (ref 136–145)
Total Protein: 8.7 g/dL — ABNORMAL HIGH (ref 6.4–8.3)

## 2013-02-04 MED ORDER — FENTANYL 25 MCG/HR TD PT72: 1.0000 | MEDICATED_PATCH | TRANSDERMAL | Status: DC

## 2013-02-04 MED ORDER — DEXAMETHASONE 2 MG PO TABS: 10.0000 mg | ORAL_TABLET | ORAL | Status: DC

## 2013-02-04 MED ORDER — SODIUM CHLORIDE 0.9 % IV SOLN
Freq: Once | INTRAVENOUS | Status: AC
  Administered 2013-02-04: 13:00:00 via INTRAVENOUS

## 2013-02-04 MED ORDER — IOHEXOL 300 MG/ML  SOLN
100.0000 mL | Freq: Once | INTRAMUSCULAR | Status: AC | PRN
  Administered 2013-02-04: 100 mL via INTRAVENOUS

## 2013-02-04 MED ORDER — MORPHINE SULFATE 10 MG/5ML PO SOLN: 10.0000 mg | ORAL | Status: DC | PRN

## 2013-02-04 MED ORDER — ONDANSETRON 8 MG PO TBDP: 8.0000 mg | ORAL_TABLET | Freq: Three times a day (TID) | ORAL | Status: DC | PRN

## 2013-02-04 MED ORDER — ZOLEDRONIC ACID 4 MG/100ML IV SOLN
4.0000 mg | Freq: Once | INTRAVENOUS | Status: AC
  Administered 2013-02-04: 4 mg via INTRAVENOUS
  Filled 2013-02-04: qty 100

## 2013-02-04 NOTE — Patient Instructions (Signed)
Zoledronic Acid injection (Hypercalcemia, Oncology) What is this medicine? ZOLEDRONIC ACID (ZOE le dron ik AS id) lowers the amount of calcium loss from bone. It is used to treat too much calcium in your blood from cancer. It is also used to prevent complications of cancer that has spread to the bone. This medicine may be used for other purposes; ask your health care provider or pharmacist if you have questions. What should I tell my health care provider before I take this medicine? They need to know if you have any of these conditions: -aspirin-sensitive asthma -dental disease -kidney disease -an unusual or allergic reaction to zoledronic acid, other medicines, foods, dyes, or preservatives -pregnant or trying to get pregnant -breast-feeding How should I use this medicine? This medicine is for infusion into a vein. It is given by a health care professional in a hospital or clinic setting. Talk to your pediatrician regarding the use of this medicine in children. Special care may be needed. Overdosage: If you think you have taken too much of this medicine contact a poison control center or emergency room at once. NOTE: This medicine is only for you. Do not share this medicine with others. What if I miss a dose? It is important not to miss your dose. Call your doctor or health care professional if you are unable to keep an appointment. What may interact with this medicine? -certain antibiotics given by injection -NSAIDs, medicines for pain and inflammation, like ibuprofen or naproxen -some diuretics like bumetanide, furosemide -teriparatide -thalidomide This list may not describe all possible interactions. Give your health care provider a list of all the medicines, herbs, non-prescription drugs, or dietary supplements you use. Also tell them if you smoke, drink alcohol, or use illegal drugs. Some items may interact with your medicine. What should I watch for while using this medicine? Visit  your doctor or health care professional for regular checkups. It may be some time before you see the benefit from this medicine. Do not stop taking your medicine unless your doctor tells you to. Your doctor may order blood tests or other tests to see how you are doing. Women should inform their doctor if they wish to become pregnant or think they might be pregnant. There is a potential for serious side effects to an unborn child. Talk to your health care professional or pharmacist for more information. You should make sure that you get enough calcium and vitamin D while you are taking this medicine. Discuss the foods you eat and the vitamins you take with your health care professional. Some people who take this medicine have severe bone, joint, and/or muscle pain. This medicine may also increase your risk for a broken thigh bone. Tell your doctor right away if you have pain in your upper leg or groin. Tell your doctor if you have any pain that does not go away or that gets worse. What side effects may I notice from receiving this medicine? Side effects that you should report to your doctor or health care professional as soon as possible: -allergic reactions like skin rash, itching or hives, swelling of the face, lips, or tongue -anxiety, confusion, or depression -breathing problems -changes in vision -feeling faint or lightheaded, falls -jaw burning, cramping, pain -muscle cramps, stiffness, or weakness -trouble passing urine or change in the amount of urine Side effects that usually do not require medical attention (report to your doctor or health care professional if they continue or are bothersome): -bone, joint, or muscle pain -  fever -hair loss -irritation at site where injected -loss of appetite -nausea, vomiting -stomach upset -tired This list may not describe all possible side effects. Call your doctor for medical advice about side effects. You may report side effects to FDA at  1-800-FDA-1088. Where should I keep my medicine? This drug is given in a hospital or clinic and will not be stored at home. NOTE: This sheet is a summary. It may not cover all possible information. If you have questions about this medicine, talk to your doctor, pharmacist, or health care provider.  2013, [employer]. (10/19/2010 9:06:58 AM)  

## 2013-02-04 NOTE — Progress Notes (Signed)
[facility]    OFFICE PROGRESS NOTE   INTERVAL HISTORY:   Douglas Jordan returns for scheduled followup with metastatic esophagus cancer. He presents with his daughter and 2 interpreter's. When he was here last month the calcium returned elevated. He was treated with Zometa on 01/14/2013. The calcium was better on 01/18/2013.  He complains of abdominal pain. He is concerned the pain is related to the gastrostomy tube. The pain is partially relieved with Roxanol. He has not been confused. He is eating very little. Douglas Jordan would like to have the feeding tube removed.   Objective:  Vital signs in last 24 hours:  Blood pressure 160/74, pulse 123, temperature 98.3 F (36.8 C), temperature source Oral, resp. rate 20, height 5\' 2"  (1.575 m), weight 123 lb 3.2 oz (55.883 kg).    HEENT: No thrush or ulcers Resp: Lungs clear bilaterally Cardio: Regular rate and rhythm GI: Left upper quadrant feeding tube site without evidence of infection. The liver edge is palpable medially in the right upper abdomen with associated tenderness, no apparent ascites Vascular: No leg edema Neuro: Alert, follows commands    Portacath/PICC-without erythema  Lab Results:  Lab Results  Component Value Date   WBC 11.7* 12/15/2012   HGB 10.2* 12/15/2012   HCT 31.0* 12/15/2012   MCV 78.8* 12/15/2012   PLT 271 12/15/2012   ANC 8.7  BUN 21.7, creatinine 1.2, calcium 12.6, alkaline phosphatase 155, albumin 2.7  Medications: I have reviewed the patient's current medications.  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  -PET scan 12/01/2012 confirmed a hypermetabolic distal esophagus mass, hypermetabolic right paratracheal lymph node, hypermetabolic right liver lesion, and hypermetabolic upper abdominal lymphadenopathy.  -completed palliative radiation 12/25/2012.  2. Dysphagia secondary to #1, status post  placement of a gastrostomy tube in radiology on 11/17/2012, he continues tube feedings.  3. Odynophagia/upper abdominal pain secondary to the esophagus mass, radiation esophagitis, ? Liver metastases and adenopathy. He continues to have abdominal pain.  4. Hypercalcemia of malignancy-status post Synetta Fail 01/14/2013, the calcium is elevated again today 5. Dyspnea-etiology unclear. Chest x-ray 12/30/2012 showed a small unilateral pleural effusion favored to be right-sided; right hemidiaphragm elevation with adjacent volume loss/atelectasis; right paratracheal soft tissue fullness. He did not complain of dyspnea today.  Disposition:  He has metastatic esophagus cancer. I discussed the diagnosis and treatment options with Douglas Jordan and his daughter today. He appears to understand the diagnosis. We discussed supportive care versus a trial of Taxol/carboplatin chemotherapy. He understands no therapy will be curative. He indicated that he does wish to be treated with chemotherapy.  He will be referred for a restaging CT evaluation today. I suspect the pain is not related to the gastrostomy tube. The tube will remain in place for now. He will continue a Duragesic patch. We increased the Roxanol dose for breakthrough pain.  He will receive intravenous fluids and Zometa today.  A first cycle of Taxol/carboplatin will be scheduled for 02/09/2013. Chemotherapy will be delivered every 3 weeks. I reviewed the specific toxicities associated with this regimen including the chance for nausea/vomiting, mucositis, diarrhea, alopecia, and hematologic toxicity. We discussed the allergic reaction, neuropathy, and bone pain associated with Taxol. He attended a chemotherapy teaching class today.  He will be scheduled for an office visit 02/18/2013. Approximately 45 minutes were spent with the patient and his family today. The majority of the time was used for counseling and coordination of care.  Thornton Papas, MD  02/04/2013   9:58 AM

## 2013-02-04 NOTE — Progress Notes (Signed)
One on one teaching done with pt, daughter in law, & two interpreters.  Discussed Taxol, Carboplatin & S.E. Of nausea, vomiting, hair loss, muscle & joint pain, allergic reactions, loss of appetite, neutropenia but not limited to these.  Gave written, verbal, & video information through interpreters.  Pt does not speak english.  Daughter in law with him speaks some english.  Pt expressed understanding through interpreter.  Pt support information also reviewed & given calendar of events & how to contact these.  Reviewed infusion room policies & gave tour of infusion room.

## 2013-02-04 NOTE — Progress Notes (Signed)
CT contrast given through G tube. 1 bottle at 1330 and 1 bottle at 1430. Pt tolerated well. Line flushed with 30 mls NS post contrast.

## 2013-02-07 ENCOUNTER — Other Ambulatory Visit: Payer: Self-pay | Admitting: Oncology

## 2013-02-08 ENCOUNTER — Telehealth: Payer: Self-pay | Admitting: Oncology

## 2013-02-08 ENCOUNTER — Other Ambulatory Visit: Payer: Self-pay

## 2013-02-08 ENCOUNTER — Other Ambulatory Visit: Payer: Self-pay | Admitting: Oncology

## 2013-02-08 NOTE — Telephone Encounter (Signed)
Gave pt appt for lab and MD for 10/7 and whole month of October 2014 °

## 2013-02-09 ENCOUNTER — Encounter: Payer: Self-pay | Admitting: Oncology

## 2013-02-09 ENCOUNTER — Ambulatory Visit (HOSPITAL_BASED_OUTPATIENT_CLINIC_OR_DEPARTMENT_OTHER): Payer: Self-pay

## 2013-02-09 ENCOUNTER — Other Ambulatory Visit (HOSPITAL_BASED_OUTPATIENT_CLINIC_OR_DEPARTMENT_OTHER): Payer: Self-pay | Admitting: Lab

## 2013-02-09 ENCOUNTER — Other Ambulatory Visit: Payer: Self-pay | Admitting: *Deleted

## 2013-02-09 VITALS — BP 157/88 | HR 111 | Temp 97.6°F | Resp 18

## 2013-02-09 DIAGNOSIS — C787 Secondary malignant neoplasm of liver and intrahepatic bile duct: Secondary | ICD-10-CM

## 2013-02-09 DIAGNOSIS — C155 Malignant neoplasm of lower third of esophagus: Secondary | ICD-10-CM

## 2013-02-09 DIAGNOSIS — Z5111 Encounter for antineoplastic chemotherapy: Secondary | ICD-10-CM

## 2013-02-09 LAB — COMPREHENSIVE METABOLIC PANEL (CC13)
AST: 22 U/L (ref 5–34)
Alkaline Phosphatase: 148 U/L (ref 40–150)
BUN: 25.9 mg/dL (ref 7.0–26.0)
Calcium: 13 mg/dL — ABNORMAL HIGH (ref 8.4–10.4)
Chloride: 101 mEq/L (ref 98–109)
Creatinine: 1.2 mg/dL (ref 0.7–1.3)

## 2013-02-09 MED ORDER — SODIUM CHLORIDE 0.9 % IV SOLN
316.0000 mg | Freq: Once | INTRAVENOUS | Status: AC
  Administered 2013-02-09: 320 mg via INTRAVENOUS
  Filled 2013-02-09: qty 32

## 2013-02-09 MED ORDER — ONDANSETRON 16 MG/50ML IVPB (CHCC)
16.0000 mg | Freq: Once | INTRAVENOUS | Status: AC
  Administered 2013-02-09: 16 mg via INTRAVENOUS

## 2013-02-09 MED ORDER — DIPHENHYDRAMINE HCL 50 MG/ML IJ SOLN
25.0000 mg | Freq: Once | INTRAMUSCULAR | Status: AC
Start: 2013-02-09 — End: 2013-02-09
  Administered 2013-02-09: 25 mg via INTRAVENOUS

## 2013-02-09 MED ORDER — SODIUM CHLORIDE 0.9 % IJ SOLN
10.0000 mL | INTRAMUSCULAR | Status: DC | PRN
  Administered 2013-02-09: 10 mL
  Filled 2013-02-09: qty 10

## 2013-02-09 MED ORDER — FAMOTIDINE IN NACL 20-0.9 MG/50ML-% IV SOLN
INTRAVENOUS | Status: AC
  Filled 2013-02-09: qty 50

## 2013-02-09 MED ORDER — DEXAMETHASONE SODIUM PHOSPHATE 20 MG/5ML IJ SOLN
INTRAMUSCULAR | Status: AC
  Filled 2013-02-09: qty 5

## 2013-02-09 MED ORDER — SODIUM CHLORIDE 0.9 % IV SOLN
Freq: Once | INTRAVENOUS | Status: AC
  Administered 2013-02-09: 10:00:00 via INTRAVENOUS

## 2013-02-09 MED ORDER — PACLITAXEL CHEMO INJECTION 300 MG/50ML
175.0000 mg/m2 | Freq: Once | INTRAVENOUS | Status: AC
  Administered 2013-02-09: 276 mg via INTRAVENOUS
  Filled 2013-02-09: qty 46

## 2013-02-09 MED ORDER — DIPHENHYDRAMINE HCL 50 MG/ML IJ SOLN
INTRAMUSCULAR | Status: AC
  Filled 2013-02-09: qty 1

## 2013-02-09 MED ORDER — FAMOTIDINE IN NACL 20-0.9 MG/50ML-% IV SOLN
20.0000 mg | Freq: Once | INTRAVENOUS | Status: AC
Start: 2013-02-09 — End: 2013-02-09
  Administered 2013-02-09: 20 mg via INTRAVENOUS

## 2013-02-09 MED ORDER — LIDOCAINE-PRILOCAINE 2.5-2.5 % EX CREA: TOPICAL_CREAM | CUTANEOUS | Status: DC

## 2013-02-09 MED ORDER — DEXAMETHASONE SODIUM PHOSPHATE 20 MG/5ML IJ SOLN
20.0000 mg | Freq: Once | INTRAMUSCULAR | Status: AC
  Administered 2013-02-09: 20 mg via INTRAVENOUS

## 2013-02-09 MED ORDER — ONDANSETRON 16 MG/50ML IVPB (CHCC)
INTRAVENOUS | Status: AC
  Filled 2013-02-09: qty 16

## 2013-02-09 MED ORDER — HEPARIN SOD (PORK) LOCK FLUSH 100 UNIT/ML IV SOLN
500.0000 [IU] | Freq: Once | INTRAVENOUS | Status: AC | PRN
  Administered 2013-02-09: 500 [IU]
  Filled 2013-02-09: qty 5

## 2013-02-09 NOTE — Progress Notes (Signed)
Checked in new patient. He speaks no english. Son with him. No ins and I advised them today will be self pay and make sure to check status of medicare and medicaid. No pcp, but was referred by emerg room [facility]. Sent to [facility] for pcp info

## 2013-02-09 NOTE — Patient Instructions (Addendum)
[facility] Discharge Instructions for Patients Receiving Chemotherapy  Today you received the following chemotherapy agents Taxol/Carboplatin To help prevent nausea and vomiting after your treatment, we encourage you to take your nausea medication as prescribed.  If you develop nausea and vomiting that is not controlled by your nausea medication, call the clinic.   BELOW ARE SYMPTOMS THAT SHOULD BE REPORTED IMMEDIATELY:  *FEVER GREATER THAN 100.5 F  *CHILLS WITH OR WITHOUT FEVER  NAUSEA AND VOMITING THAT IS NOT CONTROLLED WITH YOUR NAUSEA MEDICATION  *UNUSUAL SHORTNESS OF BREATH  *UNUSUAL BRUISING OR BLEEDING  TENDERNESS IN MOUTH AND THROAT WITH OR WITHOUT PRESENCE OF ULCERS  *URINARY PROBLEMS  *BOWEL PROBLEMS  UNUSUAL RASH Items with * indicate a potential emergency and should be followed up as soon as possible.  Feel free to call the clinic you have any questions or concerns. The clinic phone number is [phone].    

## 2013-02-09 NOTE — Progress Notes (Signed)
Taxol and carboplatin are not replaceable drugs °

## 2013-02-09 NOTE — Progress Notes (Signed)
I [place]) was walking from the restroom and seen the patient's interpreter helping the patient up from the squatted position. Patient placed in a chair, patient assessed,vtial signs stable, Dr. Truett Perna notified. Patient placed in wheelchair and taken back to his infusion chair. Simple dressing applied to patient's left knee. Patient's family member notifed. Patient instructed to let us know if he feels any discomfort or anything concerning.

## 2013-02-10 ENCOUNTER — Other Ambulatory Visit: Payer: Self-pay | Admitting: *Deleted

## 2013-02-10 ENCOUNTER — Telehealth: Payer: Self-pay | Admitting: *Deleted

## 2013-02-10 ENCOUNTER — Telehealth: Payer: Self-pay | Admitting: Oncology

## 2013-02-10 ENCOUNTER — Ambulatory Visit (HOSPITAL_BASED_OUTPATIENT_CLINIC_OR_DEPARTMENT_OTHER): Payer: Self-pay

## 2013-02-10 VITALS — BP 148/69 | HR 110 | Temp 97.6°F

## 2013-02-10 DIAGNOSIS — C155 Malignant neoplasm of lower third of esophagus: Secondary | ICD-10-CM

## 2013-02-10 DIAGNOSIS — Z5189 Encounter for other specified aftercare: Secondary | ICD-10-CM

## 2013-02-10 DIAGNOSIS — C787 Secondary malignant neoplasm of liver and intrahepatic bile duct: Secondary | ICD-10-CM

## 2013-02-10 MED ORDER — PEGFILGRASTIM INJECTION 6 MG/0.6ML
6.0000 mg | Freq: Once | SUBCUTANEOUS | Status: AC
  Administered 2013-02-10: 6 mg via SUBCUTANEOUS
  Filled 2013-02-10: qty 0.6

## 2013-02-10 NOTE — Telephone Encounter (Signed)
Mr Tantillo here for Neulasta injection following 1st taxol/carbo chemotherapy.  Daughter-in-law with patient, she speaks [place].  States that he isn't having any nausea or vomiting.  Did have some diarrhea last night, approx 3 times.  Suggested for him to use Imodium if it continued and to let us know if it doesn't go away.  Is getting fluids in his PEG tube.  All questions answered.  Knows to call if he has any problems or concerns.

## 2013-02-10 NOTE — Telephone Encounter (Signed)
Gave pt's daughter  appt for lab tomorrow

## 2013-02-10 NOTE — Telephone Encounter (Signed)
Called pt's wife via [employer] [phone]. Instructed her to bring pt to office. She reports she can bring pt in tomorrow at 12. Per wife, pt's main complaint is abdominal pain. Recommended she bring pt in today, she stated via interpreter that she is overwhelmed and can not get him to [facility] today. Orders entered for repeat CMET 10/9 and appt for possible [facility] infusion made, per Dr. Truett Perna.

## 2013-02-10 NOTE — Telephone Encounter (Signed)
Message copied by Caleb Popp on Wed Feb 10, 2013 12:34 PM ------      Message from: Thornton Papas B      Created: Tue Feb 09, 2013  6:35 PM       Ca++ still high, repeat stat cmet 10/8 and if high give 90mg  pamidronate ------

## 2013-02-10 NOTE — Patient Instructions (Signed)

## 2013-02-10 NOTE — Telephone Encounter (Signed)
Left VM for son to call triage regarding his father's condition and any questions he may have.

## 2013-02-11 ENCOUNTER — Other Ambulatory Visit (HOSPITAL_BASED_OUTPATIENT_CLINIC_OR_DEPARTMENT_OTHER): Payer: Self-pay | Admitting: Lab

## 2013-02-11 ENCOUNTER — Ambulatory Visit (HOSPITAL_BASED_OUTPATIENT_CLINIC_OR_DEPARTMENT_OTHER): Payer: Self-pay

## 2013-02-11 VITALS — BP 138/67 | HR 125 | Temp 97.7°F

## 2013-02-11 DIAGNOSIS — C155 Malignant neoplasm of lower third of esophagus: Secondary | ICD-10-CM

## 2013-02-11 LAB — COMPREHENSIVE METABOLIC PANEL (CC13)
ALT: 38 U/L (ref 0–55)
AST: 44 U/L — ABNORMAL HIGH (ref 5–34)
Albumin: 2.6 g/dL — ABNORMAL LOW (ref 3.5–5.0)
Alkaline Phosphatase: 130 U/L (ref 40–150)
BUN: 31.2 mg/dL — ABNORMAL HIGH (ref 7.0–26.0)
Potassium: 3.5 mEq/L (ref 3.5–5.1)
Sodium: 131 mEq/L — ABNORMAL LOW (ref 136–145)
Total Bilirubin: 0.41 mg/dL (ref 0.20–1.20)

## 2013-02-11 MED ORDER — HEPARIN SOD (PORK) LOCK FLUSH 100 UNIT/ML IV SOLN
500.0000 [IU] | Freq: Once | INTRAVENOUS | Status: AC
Start: 2013-02-11 — End: 2013-02-11
  Administered 2013-02-11: 500 [IU] via INTRAVENOUS
  Filled 2013-02-11: qty 5

## 2013-02-11 MED ORDER — SODIUM CHLORIDE 0.9 % IJ SOLN
10.0000 mL | INTRAMUSCULAR | Status: DC | PRN
  Administered 2013-02-11: 10 mL via INTRAVENOUS
  Filled 2013-02-11: qty 10

## 2013-02-11 MED ORDER — ZOLEDRONIC ACID 4 MG/100ML IV SOLN
4.0000 mg | Freq: Once | INTRAVENOUS | Status: AC
  Administered 2013-02-11: 4 mg via INTRAVENOUS
  Filled 2013-02-11: qty 100

## 2013-02-11 MED ORDER — SODIUM CHLORIDE 0.9 % IV SOLN
INTRAVENOUS | Status: AC
  Administered 2013-02-11: 13:00:00 via INTRAVENOUS

## 2013-02-11 NOTE — Progress Notes (Signed)
Patient c/o hiccups that started 2 days ago.  He has just a little pain at the site of his feeding tube.  Tube has just a little yellow drainage on the gauze.  It does have a slight odor.  No redness at insertion site.  Interpreter here to aid in assessment. Discussed with Dr. Truett Perna.  Patient will proceed with rest of fluids and get zometa in addition to lower calcium.  Discussed increasing fluid intake to help with high calcium.

## 2013-02-16 ENCOUNTER — Ambulatory Visit: Payer: Self-pay

## 2013-02-18 ENCOUNTER — Telehealth: Payer: Self-pay | Admitting: *Deleted

## 2013-02-18 ENCOUNTER — Ambulatory Visit (HOSPITAL_BASED_OUTPATIENT_CLINIC_OR_DEPARTMENT_OTHER): Payer: Self-pay

## 2013-02-18 ENCOUNTER — Telehealth: Payer: Self-pay | Admitting: Oncology

## 2013-02-18 ENCOUNTER — Ambulatory Visit (HOSPITAL_BASED_OUTPATIENT_CLINIC_OR_DEPARTMENT_OTHER): Payer: Self-pay | Admitting: Nurse Practitioner

## 2013-02-18 ENCOUNTER — Other Ambulatory Visit (HOSPITAL_BASED_OUTPATIENT_CLINIC_OR_DEPARTMENT_OTHER): Payer: Self-pay | Admitting: Lab

## 2013-02-18 VITALS — BP 167/75 | HR 118 | Temp 97.8°F | Resp 20 | Ht 62.0 in | Wt 117.9 lb

## 2013-02-18 DIAGNOSIS — C155 Malignant neoplasm of lower third of esophagus: Secondary | ICD-10-CM

## 2013-02-18 DIAGNOSIS — C787 Secondary malignant neoplasm of liver and intrahepatic bile duct: Secondary | ICD-10-CM

## 2013-02-18 DIAGNOSIS — R1310 Dysphagia, unspecified: Secondary | ICD-10-CM

## 2013-02-18 DIAGNOSIS — R0609 Other forms of dyspnea: Secondary | ICD-10-CM

## 2013-02-18 DIAGNOSIS — R109 Unspecified abdominal pain: Secondary | ICD-10-CM

## 2013-02-18 LAB — CBC WITH DIFFERENTIAL/PLATELET
Basophils Absolute: 0.1 10*3/uL (ref 0.0–0.1)
Eosinophils Absolute: 0 10*3/uL (ref 0.0–0.5)
HCT: 28.2 % — ABNORMAL LOW (ref 38.4–49.9)
HGB: 8.7 g/dL — ABNORMAL LOW (ref 13.0–17.1)
LYMPH%: 5.3 % — ABNORMAL LOW (ref 14.0–49.0)
MCH: 22.4 pg — ABNORMAL LOW (ref 27.2–33.4)
MCV: 72.6 fL — ABNORMAL LOW (ref 79.3–98.0)
MONO#: 2.7 10*3/uL — ABNORMAL HIGH (ref 0.1–0.9)
MONO%: 9.2 % (ref 0.0–14.0)
NEUT#: 24.6 10*3/uL — ABNORMAL HIGH (ref 1.5–6.5)
NEUT%: 85.1 % — ABNORMAL HIGH (ref 39.0–75.0)
Platelets: 246 10*3/uL (ref 140–400)
RBC: 3.88 10*6/uL — ABNORMAL LOW (ref 4.20–5.82)

## 2013-02-18 LAB — COMPREHENSIVE METABOLIC PANEL (CC13)
ALT: 20 U/L (ref 0–55)
Alkaline Phosphatase: 224 U/L — ABNORMAL HIGH (ref 40–150)
Anion Gap: 9 mEq/L (ref 3–11)
BUN: 22 mg/dL (ref 7.0–26.0)
CO2: 24 mEq/L (ref 22–29)
Calcium: 15.3 mg/dL (ref 8.4–10.4)
Glucose: 162 mg/dl — ABNORMAL HIGH (ref 70–140)
Total Bilirubin: 0.37 mg/dL (ref 0.20–1.20)
Total Protein: 8.7 g/dL — ABNORMAL HIGH (ref 6.4–8.3)

## 2013-02-18 MED ORDER — HEPARIN SOD (PORK) LOCK FLUSH 100 UNIT/ML IV SOLN
500.0000 [IU] | Freq: Once | INTRAVENOUS | Status: AC
  Administered 2013-02-18: 500 [IU] via INTRAVENOUS
  Filled 2013-02-18: qty 5

## 2013-02-18 MED ORDER — CALCITONIN (SALMON) 200 UNIT/ML IJ SOLN
200.0000 [IU] | Freq: Once | INTRAMUSCULAR | Status: AC
  Administered 2013-02-18: 200 [IU] via INTRAMUSCULAR
  Filled 2013-02-18: qty 1

## 2013-02-18 MED ORDER — DENOSUMAB 120 MG/1.7ML ~~LOC~~ SOLN
120.0000 mg | Freq: Once | SUBCUTANEOUS | Status: AC
  Administered 2013-02-18: 120 mg via SUBCUTANEOUS
  Filled 2013-02-18: qty 1.7

## 2013-02-18 MED ORDER — SODIUM CHLORIDE 0.9 % IJ SOLN
10.0000 mL | INTRAMUSCULAR | Status: DC | PRN
  Administered 2013-02-18: 10 mL via INTRAVENOUS
  Filled 2013-02-18: qty 10

## 2013-02-18 MED ORDER — SODIUM CHLORIDE 0.9 % IV SOLN
1000.0000 mL | Freq: Once | INTRAVENOUS | Status: AC
  Administered 2013-02-18: 1000 mL via INTRAVENOUS

## 2013-02-18 NOTE — Patient Instructions (Signed)
Hypercalcemia Hypercalcemia means the calcium in your blood is too high. A level above 10.5 milligrams per deciliter of blood is considered high. Calcium in our blood is important for the control of many things, such as:  Blood clotting.  Conducting of nerve impulses.  Muscle contraction.  Maintaining teeth and bone health.  Other body functions. In the bloodstream, calcium maintains a constant balance with another mineral, phosphate. Calcium is absorbed into the body through the small intestine. This is helped by Vitamin D. Calcium levels are maintained mostly by vitamin D and a hormone (parathyroid hormone). But the kidneys also help. Hypercalcemia can happen when the concentration of calcium is too high for the kidneys to maintain balance. The body maintains a balance between the calcium we eat and the calcium already in our body. If calcium intake is increased or we cannot use calcium properly, there may be problems. Some common sources of calcium are:   Dairy products.  Nuts.  Eggs.  Whole grains.  Legumes.  Green leafy vegetables. CAUSES There are many causes of this condition, but some common ones are:  Hyperparathyroidism. This is an over activity of the parathyroid gland.  Cancers of the breast, kidney, lung, head and neck are common causes of calcium increases.  Medications that cause you to urinate more often (diuretics), nausea, vomiting and diarrhea also increase the calcium in the blood.  Overuse of calcium-containing antacids. SYMPTOMS  Many patients with mild hypercalcemia have no symptoms. For those with symptoms common problems include:  Loss of appetite.  Constipation.  Increased thirst.  Heart rhythm changes.  Abnormal thinking.  Nausea.  Abdominal pain.  Kidney stones.  Mood swings.  Coma and death when severe.  Vomiting.  Increased urination.  High blood pressure.  Confusion. DIAGNOSIS   Your caregiver will do a medical history  and perform a physical exam on you.  Calcium and parathyroid hormone (PTH) may be measured with a blood test. TREATMENT   The treatment depends on the calcium level and what is causing the higher level. Hypercalcemia can be lifethreatening. Fast lowering of the calcium level may be necessary.  With normal kidney function, fluids can be given by vein to clear the excess calcium. Hemodialysis works well to reduce dangerous calcium levels if there is poor kidney function. This is a procedure in which a machine is used to filter out unwanted substances. The blood is then returned to the body.  Drugs, such as diuretics, can be given after adequate fluid intake is established. These medications help the kidneys get rid of extra calcium. Drugs that lessen (inhibit) bone loss are helpful in gaining long-term control. Phosphate pills help lower high calcium levels caused by a low supply of phosphate. Anti-inflammatory agents such as steroids are helpful with some cancers and toxic levels of vitamin D.  Treatment of the underlying cause of the hypercalcemia will also correct the imbalance. Hyperparathyroidism is usually treated by surgical removal of one or more of the parathyroid glands and any tissue, other than the glands themselves, that is producing too much hormone.  The hypercalcemia caused by cancer is difficult to treat without controlling the cancer. Symptoms can be improved with fluids and drug therapy as outlined above. PROGNOSIS   Surgery to remove the parathyroid glands is usually successful. This also depends on the amount of damage to the kidneys and whether or not it can be treated.  Mild hypercalcemia can be controlled with good fluid intake and the use of effective medications.  Hypercalcemia often develops as a late complication of cancer. The expected outlook is poor without effective anticancer therapy. PREVENTION   If you are at risk for developing hypercalcemia, be familiar with  early symptoms. Report these to your caregiver.  Good fluid intake (up to four quarts of liquid a day if possible) is helpful.  Try to control nausea and vomiting, and treat fevers to avoid dehydration.  Lowering the amount of calcium in your diet is not necessary. High blood calcium reduces absorption of calcium in the intestine.  Stay as active as possible. SEEK IMMEDIATE MEDICAL CARE IF:   You develop chest pain, sweating, or shortness of breath.  You get confused, feel faint or pass out.  You develop severe nausea and vomiting. MAKE SURE YOU:   Understand these instructions.  Will watch your condition.  Will get help right away if you are not doing well or get worse. Document Released: 07/06/2004 Document Revised: 07/15/2011 Document Reviewed: 04/17/2010 [facility] Patient Information 2014 [place], [place].    Denosumab injection What is this medicine? DENOSUMAB slows bone breakdown. It is used to treat osteoporosis in women after menopause and in men. This medicine is also used to prevent bone fractures and other bone problems caused by cancer bone metastases. This medicine may be used for other purposes; ask your health care provider or pharmacist if you have questions. What should I tell my health care provider before I take this medicine? They need to know if you have any of these conditions: -dental disease -eczema -infection or history of infections -kidney disease or on dialysis -low blood calcium or vitamin D -malabsorption syndrome -scheduled to have surgery or tooth extraction -taking medicine that contains denosumab -thyroid or parathyroid disease -an unusual reaction to denosumab, other medicines, foods, dyes, or preservatives -pregnant or trying to get pregnant -breast-feeding How should I use this medicine? This medicine is for injection under the skin. It is given by a health care professional in a hospital or clinic setting. If you are getting Prolia,  a special MedGuide will be given to you by the pharmacist with each prescription and refill. Be sure to read this information carefully each time. Talk to your pediatrician regarding the use of this medicine in children. Special care may be needed. Overdosage: If you think you've taken too much of this medicine contact a poison control center or emergency room at once. Overdosage: If you think you have taken too much of this medicine contact a poison control center or emergency room at once. NOTE: This medicine is only for you. Do not share this medicine with others. What if I miss a dose? It is important not to miss your dose. Call your doctor or health care professional if you are unable to keep an appointment. What may interact with this medicine? Do not take this medicine with any of the following medications: -other medicines containing denosumab This medicine may also interact with the following medications: -medicines that suppress the immune system -medicines that treat cancer -steroid medicines like prednisone or cortisone This list may not describe all possible interactions. Give your health care provider a list of all the medicines, herbs, non-prescription drugs, or dietary supplements you use. Also tell them if you smoke, drink alcohol, or use illegal drugs. Some items may interact with your medicine. What should I watch for while using this medicine? Visit your doctor or health care professional for regular checks on your progress. Your doctor or health care professional may order blood tests  and other tests to see how you are doing. Call your doctor or health care professional if you get a cold or other infection while receiving this medicine. Do not treat yourself. This medicine may decrease your body's ability to fight infection. You should make sure you get enough calcium and vitamin D while you are taking this medicine, unless your doctor tells you not to. Discuss the foods you  eat and the vitamins you take with your health care professional. See your dentist regularly. Brush and floss your teeth as directed. Before you have any dental work done, tell your dentist you are receiving this medicine. What side effects may I notice from receiving this medicine? Side effects that you should report to your doctor or health care professional as soon as possible: -allergic reactions like skin rash, itching or hives, swelling of the face, lips, or tongue -breathing problems -chest pain -fast, irregular heartbeat -feeling faint or lightheaded, falls -fever, chills, or any other sign of infection -muscle spasms, tightening, or twitches -numbness or tingling -skin blisters or bumps, or is dry, peels, or red -slow healing or unexplained pain in the mouth or jaw -unusual bleeding or bruising Side effects that usually do not require medical attention (Report these to your doctor or health care professional if they continue or are bothersome.): -muscle pain -stomach upset, gas This list may not describe all possible side effects. Call your doctor for medical advice about side effects. You may report side effects to FDA at 1-800-FDA-1088. Where should I keep my medicine? This medicine is only given in a clinic, doctor's office, or other health care setting and will not be stored at home. NOTE: This sheet is a summary. It may not cover all possible information. If you have questions about this medicine, talk to your doctor, pharmacist, or health care provider.  2013, [employer]. (01/29/2011 3:40:41 PM)

## 2013-02-18 NOTE — Telephone Encounter (Signed)
Gave pt appt for lab tomorrow °

## 2013-02-18 NOTE — Telephone Encounter (Signed)
Per staff message and POF I have scheduled appts.  JMW  

## 2013-02-18 NOTE — Telephone Encounter (Signed)
Gave pt appt for lab, IVF,chemo and Md for October 2014

## 2013-02-18 NOTE — Progress Notes (Addendum)
OFFICE PROGRESS NOTE  Interval history:  Douglas Jordan is an 79 year old man seen for followup of metastatic esophagus cancer. He completed cycle 1 Taxol/carboplatin 02/09/2013. He received Neulasta support.  He received Zometa 4 mg IV on 02/04/2013 and 02/11/2013 for hypercalcemia.  He is accompanied to today's visit by an interpreter and his daughter. He is more fatigued. He has intermittent nausea/vomiting. No mouth sores. He denies diarrhea or constipation. He continues tube feedings. The tube feedings are his main source of nutrition. He occasionally takes food by mouth. He continues to have pain around the tube. His daughter has noted some drainage. He has had no fever. Both the interpreter and his daughter think he has been intermittently confused over the past several weeks.   Objective: Blood pressure 167/75, pulse 118, temperature 97.8 F (36.6 C), temperature source Oral, resp. rate 20, height 5\' 2"  (1.575 m), weight 117 lb 14.4 oz (53.479 kg).  Oropharynx is without thrush or ulceration. Mucous membranes appear moist. Inspiratory rales at both lung bases. No respiratory distress. Regular cardiac rhythm. Abdomen is soft, distended. Bowel sounds active. The feeding tube site is without evidence of infection. Legs are without edema. He is alert. He follows commands. Skin turgor is normal.  Lab Results: Lab Results  Component Value Date   WBC 28.9* 02/18/2013   HGB 8.7* 02/18/2013   HCT 28.2* 02/18/2013   MCV 72.6* 02/18/2013   PLT 246 02/18/2013    Chemistry:    Chemistry      Component Value Date/Time   NA 136 02/18/2013 0939   NA 137 11/21/2012 0532   K 3.7 02/18/2013 0939   K 4.6 11/21/2012 0532   CL 102 11/21/2012 0532   CO2 24 02/18/2013 0939   CO2 31 11/21/2012 0532   BUN 22.0 02/18/2013 0939   BUN 13 11/21/2012 0532   CREATININE 1.2 02/18/2013 0939   CREATININE 1.00 11/21/2012 0532      Component Value Date/Time   CALCIUM 15.3 Repeated and Verified* 02/18/2013 0939    CALCIUM 9.8 11/21/2012 0532   ALKPHOS 224* 02/18/2013 0939   AST 42* 02/18/2013 0939   ALT 20 02/18/2013 0939   BILITOT 0.37 02/18/2013 0939       Studies/Results: Ct Chest W Contrast  02/04/2013   CLINICAL DATA:  restaging esophageal cancer  EXAM: CT CHEST, ABDOMEN WITH CONTRAST  TECHNIQUE: Multidetector CT imaging of the chest, abdomen was performed following the standard protocol during bolus administration of intravenous contrast.  CONTRAST:  OMNIPAQUE IOHEXOL 300 MG/ML  SOLN  COMPARISON:  CT 11/13/2012  FINDINGS: CT CHEST FINDINGS  There are bilateral pleural effusions identified, left-greater-than-right. These are new from previous exam. There is an enhancing nodule within the posterior, medial left lung pleura measuring 1.6 cm, image 33/ series 2. This is compared with 1.5 cm previously. Enhancing pleural nodule just anterior to the descending aorta measures 1.5 cm, image 35/ series 2. Previously 1.4 cm. The left pleural effusion has increased in volume from the previous exam. New right pleural effusion.  New nodule within the right lower lobe measures 1.2 cm, image 32/series 5. Right middle lobe nodule measures 1.2 cm, image 27/ series 5. This is compared with 0.6 cm previously. New left upper lobe nodule is identified measuring 3 mm, image 27/ series 5.  Normal appearance of the heart size. No pericardial effusion. Enlarged right paratracheal lymph node measures 1.9 cm, image 10/ series 2. This is compared with 1.6 cm previously. Pre-vascular lymph node within the superior  mediastinum measures 1.4 cm, image 11/ series 2. This is new from previous exam. Thickened distal esophagus now measures 2.7 x 2.6 cm, image 35/ series 2. This previously measured 4.0 x1.4 cm. Review of the visualized osseous structures is negative for aggressive lytic or sclerotic bone lesion.  CT ABDOMEN FINDINGS  There has been marked increase in size of tumor within the gastrohepatic ligament. This now invades the  caudate lobe of liver and inferior aspect of the left hepatic lobe. The tumor currently measures 8.2 cm, image 49/ series 2. Previously 3.8 cm. Gallbladder appears normal. No significant biliary dilatation. The pancreas is unremarkable. The adrenal glands are both normal.  Both kidneys are unremarkable. There has been interval development of extensive peritoneal disease within the upper abdomen. New tumor deposit along the falciform ligament measures 3.7 cm, image 49/series 2. Tumor deposit along the inferior margin of the right hepatic lobe measures 2.5 cm, image 56/ series 2. Tumor within the left upper quadrant of the abdomen between the spleen and posterior wall of stomach measures 3.6 cm, image 51/ series 2. There is also extensive peritoneal tumor encasing the spleen. There is mild abdominal ascites present. The patient has a gastrostomy tube. The bowel loops within the upper stress set the visualized bowel loops within the upper abdomen appear nonobstructed.  The visualized osseous structures are negative for aggressive lytic or sclerotic bone lesion.  IMPRESSION: CT CHEST IMPRESSION  1. Interval development of bilateral pleural effusions left greater than right. Stable to slight increase in size of peripherally enhancing pleural nodules within the left hemi thorax.  2. Increase in size of pulmonary nodularity in the right lung.  3. Progression of mediastinal lymph node metastasis.  4. Primary esophageal tumor is decrease in size from previous exam.  CT ABDOMEN AND PELVIS IMPRESSION  1. Marked progression of tumor within the upper abdomen. The previously described gastrohepatic ligament lymph node has markedly increased in size from previous exam and now invades the caudate lobe of liver and left hepatic lobe.  2. Significant progression of peritoneal tumor within the upper abdomen.   Electronically Signed   By: Signa Kell M.D.   On: 02/04/2013 17:08   Ct Abdomen W Contrast  02/04/2013   CLINICAL DATA:   restaging esophageal cancer  EXAM: CT CHEST, ABDOMEN WITH CONTRAST  TECHNIQUE: Multidetector CT imaging of the chest, abdomen was performed following the standard protocol during bolus administration of intravenous contrast.  CONTRAST:  OMNIPAQUE IOHEXOL 300 MG/ML  SOLN  COMPARISON:  CT 11/13/2012  FINDINGS: CT CHEST FINDINGS  There are bilateral pleural effusions identified, left-greater-than-right. These are new from previous exam. There is an enhancing nodule within the posterior, medial left lung pleura measuring 1.6 cm, image 33/ series 2. This is compared with 1.5 cm previously. Enhancing pleural nodule just anterior to the descending aorta measures 1.5 cm, image 35/ series 2. Previously 1.4 cm. The left pleural effusion has increased in volume from the previous exam. New right pleural effusion.  New nodule within the right lower lobe measures 1.2 cm, image 32/series 5. Right middle lobe nodule measures 1.2 cm, image 27/ series 5. This is compared with 0.6 cm previously. New left upper lobe nodule is identified measuring 3 mm, image 27/ series 5.  Normal appearance of the heart size. No pericardial effusion. Enlarged right paratracheal lymph node measures 1.9 cm, image 10/ series 2. This is compared with 1.6 cm previously. Pre-vascular lymph node within the superior mediastinum measures 1.4 cm, image  11/ series 2. This is new from previous exam. Thickened distal esophagus now measures 2.7 x 2.6 cm, image 35/ series 2. This previously measured 4.0 x1.4 cm. Review of the visualized osseous structures is negative for aggressive lytic or sclerotic bone lesion.  CT ABDOMEN FINDINGS  There has been marked increase in size of tumor within the gastrohepatic ligament. This now invades the caudate lobe of liver and inferior aspect of the left hepatic lobe. The tumor currently measures 8.2 cm, image 49/ series 2. Previously 3.8 cm. Gallbladder appears normal. No significant biliary dilatation. The pancreas is  unremarkable. The adrenal glands are both normal.  Both kidneys are unremarkable. There has been interval development of extensive peritoneal disease within the upper abdomen. New tumor deposit along the falciform ligament measures 3.7 cm, image 49/series 2. Tumor deposit along the inferior margin of the right hepatic lobe measures 2.5 cm, image 56/ series 2. Tumor within the left upper quadrant of the abdomen between the spleen and posterior wall of stomach measures 3.6 cm, image 51/ series 2. There is also extensive peritoneal tumor encasing the spleen. There is mild abdominal ascites present. The patient has a gastrostomy tube. The bowel loops within the upper stress set the visualized bowel loops within the upper abdomen appear nonobstructed.  The visualized osseous structures are negative for aggressive lytic or sclerotic bone lesion.  IMPRESSION: CT CHEST IMPRESSION  1. Interval development of bilateral pleural effusions left greater than right. Stable to slight increase in size of peripherally enhancing pleural nodules within the left hemi thorax.  2. Increase in size of pulmonary nodularity in the right lung.  3. Progression of mediastinal lymph node metastasis.  4. Primary esophageal tumor is decrease in size from previous exam.  CT ABDOMEN AND PELVIS IMPRESSION  1. Marked progression of tumor within the upper abdomen. The previously described gastrohepatic ligament lymph node has markedly increased in size from previous exam and now invades the caudate lobe of liver and left hepatic lobe.  2. Significant progression of peritoneal tumor within the upper abdomen.   Electronically Signed   By: Signa Kell M.D.   On: 02/04/2013 17:08    Medications: I have reviewed the patient's current medications.  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  -PET scan 12/01/2012 confirmed a hypermetabolic distal esophagus  mass, hypermetabolic right paratracheal lymph node, hypermetabolic right liver lesion, and hypermetabolic upper abdominal lymphadenopathy.  -completed palliative radiation 12/25/2012.  2. Dysphagia secondary to #1, status post placement of a gastrostomy tube in radiology on 11/17/2012, he continues tube feedings.  3. Odynophagia/upper abdominal pain secondary to the esophagus mass, radiation esophagitis, ? Liver metastases and adenopathy. He continues to have abdominal pain.  4. Hypercalcemia of malignancy-status post Zometa 01/14/2013, 02/04/2013 and 02/11/2013. The calcium is further elevated today  5. Dyspnea-etiology unclear. Chest x-ray 12/30/2012 showed a small unilateral pleural effusion favored to be right-sided; right hemidiaphragm elevation with adjacent volume loss/atelectasis; right paratracheal soft tissue fullness. He did not complain of dyspnea today.  Disposition-he has completed one cycle of carboplatin/Taxol. He has persistent hypercalcemia despite Zometa. Dr. Truett Perna recommends calcitonin, Rivka Barbara and IV fluids today. He will return for a repeat chemistry panel, calcitonin and IV fluids on 02/19/2013. He will return for a followup visit on 02/22/2013. His family understands to contact the office in the interim with any changes in his condition. We specifically discussed lethargy.  Patient seen with Dr. Truett Perna.  30  minutes were spent face-to-face at today's visit with greater than 50% of that time involved in counseling/coordination of care.   Lonna Cobb ANP/GNP-BC   This was a shared visit with Lonna Cobb.  he has refractory hypercalcemia. The hypercalcemia has not responded to Zometa or chemotherapy. I discussed this difficult situation with the patient and his family in the presence of an interpreter. He will receive additional treatment for hypercalcemia today. He will return on  02/19/2013 for a repeat calcium level and treatment..   We will consider switching to a  different systemic therapy regimen or hospice care if the calcium does not improve over the next few days.   Mancel Bale, M.D.

## 2013-02-19 ENCOUNTER — Other Ambulatory Visit: Payer: Self-pay | Admitting: Nurse Practitioner

## 2013-02-19 ENCOUNTER — Other Ambulatory Visit: Payer: Self-pay | Admitting: *Deleted

## 2013-02-19 ENCOUNTER — Other Ambulatory Visit (HOSPITAL_BASED_OUTPATIENT_CLINIC_OR_DEPARTMENT_OTHER): Payer: Self-pay | Admitting: Lab

## 2013-02-19 ENCOUNTER — Ambulatory Visit (HOSPITAL_BASED_OUTPATIENT_CLINIC_OR_DEPARTMENT_OTHER): Payer: Self-pay

## 2013-02-19 VITALS — BP 151/84 | HR 95 | Temp 96.0°F | Resp 9

## 2013-02-19 DIAGNOSIS — E876 Hypokalemia: Secondary | ICD-10-CM

## 2013-02-19 DIAGNOSIS — C155 Malignant neoplasm of lower third of esophagus: Secondary | ICD-10-CM

## 2013-02-19 LAB — CBC WITH DIFFERENTIAL/PLATELET
BASO%: 0.6 % (ref 0.0–2.0)
EOS%: 0 % (ref 0.0–7.0)
Eosinophils Absolute: 0 10*3/uL (ref 0.0–0.5)
HCT: 27.6 % — ABNORMAL LOW (ref 38.4–49.9)
HGB: 8.5 g/dL — ABNORMAL LOW (ref 13.0–17.1)
MCH: 22.3 pg — ABNORMAL LOW (ref 27.2–33.4)
MCHC: 30.8 g/dL — ABNORMAL LOW (ref 32.0–36.0)
MCV: 72.4 fL — ABNORMAL LOW (ref 79.3–98.0)
NEUT%: 91.5 % — ABNORMAL HIGH (ref 39.0–75.0)
Platelets: 241 10*3/uL (ref 140–400)
RDW: 17.7 % — ABNORMAL HIGH (ref 11.0–14.6)
lymph#: 1.2 10*3/uL (ref 0.9–3.3)

## 2013-02-19 LAB — COMPREHENSIVE METABOLIC PANEL (CC13)
ALT: 22 U/L (ref 0–55)
AST: 35 U/L — ABNORMAL HIGH (ref 5–34)
Alkaline Phosphatase: 209 U/L — ABNORMAL HIGH (ref 40–150)
Anion Gap: 11 mEq/L (ref 3–11)
Calcium: 13.8 mg/dL (ref 8.4–10.4)
Chloride: 105 mEq/L (ref 98–109)
Creatinine: 1.1 mg/dL (ref 0.7–1.3)
Total Bilirubin: 0.32 mg/dL (ref 0.20–1.20)
Total Protein: 8.3 g/dL (ref 6.4–8.3)

## 2013-02-19 MED ORDER — SODIUM CHLORIDE 0.9 % IJ SOLN
10.0000 mL | Freq: Once | INTRAMUSCULAR | Status: AC
  Administered 2013-02-19: 10 mL
  Filled 2013-02-19: qty 10

## 2013-02-19 MED ORDER — POTASSIUM CHLORIDE CRYS ER 20 MEQ PO TBCR: 20.0000 meq | EXTENDED_RELEASE_TABLET | Freq: Every day | ORAL | Status: DC

## 2013-02-19 MED ORDER — CALCITONIN (SALMON) 200 UNIT/ML IJ SOLN
200.0000 [IU] | Freq: Once | INTRAMUSCULAR | Status: AC
  Administered 2013-02-19: 200 [IU] via INTRAMUSCULAR
  Filled 2013-02-19: qty 1

## 2013-02-19 MED ORDER — SODIUM CHLORIDE 0.9 % IV SOLN
Freq: Once | INTRAVENOUS | Status: AC
  Administered 2013-02-19: 10:00:00 via INTRAVENOUS

## 2013-02-19 MED ORDER — HEPARIN SOD (PORK) LOCK FLUSH 100 UNIT/ML IV SOLN
500.0000 [IU] | Freq: Once | INTRAVENOUS | Status: AC
  Administered 2013-02-19: 500 [IU] via INTRAVENOUS
  Filled 2013-02-19: qty 5

## 2013-02-19 NOTE — Patient Instructions (Signed)
Dehydration, Adult Dehydration is when you lose more fluids from the body than you take in. Vital organs like the kidneys, brain, and heart cannot function without a proper amount of fluids and salt. Any loss of fluids from the body can cause dehydration.  CAUSES   Vomiting.  Diarrhea.  Excessive sweating.  Excessive urine output.  Fever. SYMPTOMS  Mild dehydration  Thirst.  Dry lips.  Slightly dry mouth. Moderate dehydration  Very dry mouth.  Sunken eyes.  Skin does not bounce back quickly when lightly pinched and released.  Dark urine and decreased urine production.  Decreased tear production.  Headache. Severe dehydration  Very dry mouth.  Extreme thirst.  Rapid, weak pulse (more than 100 beats per minute at rest).  Cold hands and feet.  Not able to sweat in spite of heat and temperature.  Rapid breathing.  Blue lips.  Confusion and lethargy.  Difficulty being awakened.  Minimal urine production.  No tears. DIAGNOSIS  Your caregiver will diagnose dehydration based on your symptoms and your exam. Blood and urine tests will help confirm the diagnosis. The diagnostic evaluation should also identify the cause of dehydration. TREATMENT  Treatment of mild or moderate dehydration can often be done at home by increasing the amount of fluids that you drink. It is best to drink small amounts of fluid more often. Drinking too much at one time can make vomiting worse. Refer to the home care instructions below. Severe dehydration needs to be treated at the hospital where you will probably be given intravenous (IV) fluids that contain water and electrolytes. HOME CARE INSTRUCTIONS   Ask your caregiver about specific rehydration instructions.  Drink enough fluids to keep your urine clear or pale yellow.  Drink small amounts frequently if you have nausea and vomiting.  Eat as you normally do.  Avoid:  Foods or drinks high in sugar.  Carbonated  drinks.  Juice.  Extremely hot or cold fluids.  Drinks with caffeine.  Fatty, greasy foods.  Alcohol.  Tobacco.  Overeating.  Gelatin desserts.  Wash your hands well to avoid spreading bacteria and viruses.  Only take over-the-counter or prescription medicines for pain, discomfort, or fever as directed by your caregiver.  Ask your caregiver if you should continue all prescribed and over-the-counter medicines.  Keep all follow-up appointments with your caregiver. SEEK MEDICAL CARE IF:  You have abdominal pain and it increases or stays in one area (localizes).  You have a rash, stiff neck, or severe headache.  You are irritable, sleepy, or difficult to awaken.  You are weak, dizzy, or extremely thirsty. SEEK IMMEDIATE MEDICAL CARE IF:   You are unable to keep fluids down or you get worse despite treatment.  You have frequent episodes of vomiting or diarrhea.  You have blood or green matter (bile) in your vomit.  You have blood in your stool or your stool looks black and tarry.  You have not urinated in 6 to 8 hours, or you have only urinated a small amount of very dark urine.  You have a fever.  You faint. MAKE SURE YOU:   Understand these instructions.  Will watch your condition.  Will get help right away if you are not doing well or get worse. Document Released: 04/22/2005 Document Revised: 07/15/2011 Document Reviewed: 12/10/2010 [employer] Patient Information 2014 [employer].  

## 2013-02-19 NOTE — Progress Notes (Signed)
Instructed daughter to stop by [employer] to pick up Kdur med as per Misty Stanley, NP's instructions.  Reminded daughter again through interpreter to pick up Kdur at pharmacy.  Daughter voiced understanding.

## 2013-02-19 NOTE — Progress Notes (Signed)
Per Dr. Truett Perna, would like patient to receive another liter of NS on Saturday. POF made and orders placed as S&H.

## 2013-02-20 ENCOUNTER — Ambulatory Visit (HOSPITAL_BASED_OUTPATIENT_CLINIC_OR_DEPARTMENT_OTHER): Payer: Self-pay

## 2013-02-20 VITALS — BP 161/91 | HR 118 | Temp 97.9°F | Resp 20

## 2013-02-20 DIAGNOSIS — C155 Malignant neoplasm of lower third of esophagus: Secondary | ICD-10-CM

## 2013-02-20 DIAGNOSIS — Z5189 Encounter for other specified aftercare: Secondary | ICD-10-CM

## 2013-02-20 MED ORDER — SODIUM CHLORIDE 0.9 % IV SOLN
Freq: Once | INTRAVENOUS | Status: AC
  Administered 2013-02-20: 10:00:00 via INTRAVENOUS

## 2013-02-20 NOTE — Patient Instructions (Signed)
Dehydration, Adult Dehydration is when you lose more fluids from the body than you take in. Vital organs like the kidneys, brain, and heart cannot function without a proper amount of fluids and salt. Any loss of fluids from the body can cause dehydration.  CAUSES   Vomiting.  Diarrhea.  Excessive sweating.  Excessive urine output.  Fever. SYMPTOMS  Mild dehydration  Thirst.  Dry lips.  Slightly dry mouth. Moderate dehydration  Very dry mouth.  Sunken eyes.  Skin does not bounce back quickly when lightly pinched and released.  Dark urine and decreased urine production.  Decreased tear production.  Headache. Severe dehydration  Very dry mouth.  Extreme thirst.  Rapid, weak pulse (more than 100 beats per minute at rest).  Cold hands and feet.  Not able to sweat in spite of heat and temperature.  Rapid breathing.  Blue lips.  Confusion and lethargy.  Difficulty being awakened.  Minimal urine production.  No tears. DIAGNOSIS  Your caregiver will diagnose dehydration based on your symptoms and your exam. Blood and urine tests will help confirm the diagnosis. The diagnostic evaluation should also identify the cause of dehydration. TREATMENT  Treatment of mild or moderate dehydration can often be done at home by increasing the amount of fluids that you drink. It is best to drink small amounts of fluid more often. Drinking too much at one time can make vomiting worse. Refer to the home care instructions below. Severe dehydration needs to be treated at the hospital where you will probably be given intravenous (IV) fluids that contain water and electrolytes. HOME CARE INSTRUCTIONS   Ask your caregiver about specific rehydration instructions.  Drink enough fluids to keep your urine clear or pale yellow.  Drink small amounts frequently if you have nausea and vomiting.  Eat as you normally do.  Avoid:  Foods or drinks high in sugar.  Carbonated  drinks.  Juice.  Extremely hot or cold fluids.  Drinks with caffeine.  Fatty, greasy foods.  Alcohol.  Tobacco.  Overeating.  Gelatin desserts.  Wash your hands well to avoid spreading bacteria and viruses.  Only take over-the-counter or prescription medicines for pain, discomfort, or fever as directed by your caregiver.  Ask your caregiver if you should continue all prescribed and over-the-counter medicines.  Keep all follow-up appointments with your caregiver. SEEK MEDICAL CARE IF:  You have abdominal pain and it increases or stays in one area (localizes).  You have a rash, stiff neck, or severe headache.  You are irritable, sleepy, or difficult to awaken.  You are weak, dizzy, or extremely thirsty. SEEK IMMEDIATE MEDICAL CARE IF:   You are unable to keep fluids down or you get worse despite treatment.  You have frequent episodes of vomiting or diarrhea.  You have blood or green matter (bile) in your vomit.  You have blood in your stool or your stool looks black and tarry.  You have not urinated in 6 to 8 hours, or you have only urinated a small amount of very dark urine.  You have a fever.  You faint. MAKE SURE YOU:   Understand these instructions.  Will watch your condition.  Will get help right away if you are not doing well or get worse. Document Released: 04/22/2005 Document Revised: 07/15/2011 Document Reviewed: 12/10/2010 [employer] Patient Information 2014 [employer].  

## 2013-02-20 NOTE — Progress Notes (Signed)
Per Dr. Welton Flakes - ok to proceed with fluids with BP.  See [facility]

## 2013-02-22 ENCOUNTER — Other Ambulatory Visit (HOSPITAL_BASED_OUTPATIENT_CLINIC_OR_DEPARTMENT_OTHER): Payer: Self-pay

## 2013-02-22 ENCOUNTER — Ambulatory Visit (HOSPITAL_BASED_OUTPATIENT_CLINIC_OR_DEPARTMENT_OTHER): Payer: Self-pay | Admitting: Nurse Practitioner

## 2013-02-22 ENCOUNTER — Telehealth: Payer: Self-pay | Admitting: Oncology

## 2013-02-22 VITALS — BP 150/85 | HR 118 | Temp 98.7°F | Resp 20 | Ht 62.0 in | Wt 120.5 lb

## 2013-02-22 DIAGNOSIS — R0609 Other forms of dyspnea: Secondary | ICD-10-CM

## 2013-02-22 DIAGNOSIS — C787 Secondary malignant neoplasm of liver and intrahepatic bile duct: Secondary | ICD-10-CM

## 2013-02-22 DIAGNOSIS — C155 Malignant neoplasm of lower third of esophagus: Secondary | ICD-10-CM

## 2013-02-22 DIAGNOSIS — R599 Enlarged lymph nodes, unspecified: Secondary | ICD-10-CM

## 2013-02-22 DIAGNOSIS — R109 Unspecified abdominal pain: Secondary | ICD-10-CM

## 2013-02-22 LAB — COMPREHENSIVE METABOLIC PANEL (CC13)
Alkaline Phosphatase: 229 U/L — ABNORMAL HIGH (ref 40–150)
Anion Gap: 10 mEq/L (ref 3–11)
BUN: 25.9 mg/dL (ref 7.0–26.0)
Glucose: 120 mg/dl (ref 70–140)
Total Bilirubin: 0.34 mg/dL (ref 0.20–1.20)

## 2013-02-22 NOTE — Telephone Encounter (Signed)
gv and printed appt sched and av sfo rpt for OCT and NOV.... °

## 2013-02-22 NOTE — Progress Notes (Addendum)
OFFICE PROGRESS NOTE  Interval history:  Douglas Jordan returns as scheduled. The calcium returned elevated at 15.3 on 02/18/2013. He received IV fluids, calcitonin and Xgeva. On 02/19/2013 the calcium returned at 13.8. He received calcitonin and additional IV fluids. He also returned for IV fluids on 02/20/2013.    He is accompanied to today's visit by an interpreter. He is feeling somewhat better. He continues to have abdominal pain. He had an episode of nausea/vomiting yesterday. Main source of nutrition continues to be tube feedings. He was able to drink some soup over the weekend. Bowels are moving regularly. He denies constipation. The interpreter notes he is intermittently confused. He has intermittent blurred vision typically occurring at nighttime.   Objective: Blood pressure 150/85, pulse 118, temperature 98.7 F (37.1 C), temperature source Oral, resp. rate 20, height 5\' 2"  (1.575 m), weight 120 lb 8 oz (54.658 kg).  Pupils equal round and reactive to light. Extraocular movements intact. Oropharynx is without thrush or ulceration. Breath sounds diminished at the left lung base. Rales at the right lung base. No respiratory distress. Regular cardiac rhythm. Port-A-Cath site is without erythema. Abdomen is soft, distended. Palpable mass medial right upper abdomen. Tube feeding site is without evidence of infection. No edema. He is alert.  Lab Results: Lab Results  Component Value Date   WBC 31.4* 02/19/2013   HGB 8.5* 02/19/2013   HCT 27.6* 02/19/2013   MCV 72.4* 02/19/2013   PLT 241 02/19/2013    Chemistry:    Chemistry      Component Value Date/Time   NA 137 02/22/2013 1417   NA 137 11/21/2012 0532   K 3.9 02/22/2013 1417   K 4.6 11/21/2012 0532   CL 102 11/21/2012 0532   CO2 22 02/22/2013 1417   CO2 31 11/21/2012 0532   BUN 25.9 02/22/2013 1417   BUN 13 11/21/2012 0532   CREATININE 1.0 02/22/2013 1417   CREATININE 1.00 11/21/2012 0532      Component Value Date/Time   CALCIUM  14.2* 02/22/2013 1417   CALCIUM 9.8 11/21/2012 0532   ALKPHOS 229* 02/22/2013 1417   AST 31 02/22/2013 1417   ALT 18 02/22/2013 1417   BILITOT 0.34 02/22/2013 1417       Studies/Results: Ct Chest W Contrast  02/04/2013   CLINICAL DATA:  restaging esophageal cancer  EXAM: CT CHEST, ABDOMEN WITH CONTRAST  TECHNIQUE: Multidetector CT imaging of the chest, abdomen was performed following the standard protocol during bolus administration of intravenous contrast.  CONTRAST:  OMNIPAQUE IOHEXOL 300 MG/ML  SOLN  COMPARISON:  CT 11/13/2012  FINDINGS: CT CHEST FINDINGS  There are bilateral pleural effusions identified, left-greater-than-right. These are new from previous exam. There is an enhancing nodule within the posterior, medial left lung pleura measuring 1.6 cm, image 33/ series 2. This is compared with 1.5 cm previously. Enhancing pleural nodule just anterior to the descending aorta measures 1.5 cm, image 35/ series 2. Previously 1.4 cm. The left pleural effusion has increased in volume from the previous exam. New right pleural effusion.  New nodule within the right lower lobe measures 1.2 cm, image 32/series 5. Right middle lobe nodule measures 1.2 cm, image 27/ series 5. This is compared with 0.6 cm previously. New left upper lobe nodule is identified measuring 3 mm, image 27/ series 5.  Normal appearance of the heart size. No pericardial effusion. Enlarged right paratracheal lymph node measures 1.9 cm, image 10/ series 2. This is compared with 1.6 cm previously. Pre-vascular lymph node  within the superior mediastinum measures 1.4 cm, image 11/ series 2. This is new from previous exam. Thickened distal esophagus now measures 2.7 x 2.6 cm, image 35/ series 2. This previously measured 4.0 x1.4 cm. Review of the visualized osseous structures is negative for aggressive lytic or sclerotic bone lesion.  CT ABDOMEN FINDINGS  There has been marked increase in size of tumor within the gastrohepatic ligament.  This now invades the caudate lobe of liver and inferior aspect of the left hepatic lobe. The tumor currently measures 8.2 cm, image 49/ series 2. Previously 3.8 cm. Gallbladder appears normal. No significant biliary dilatation. The pancreas is unremarkable. The adrenal glands are both normal.  Both kidneys are unremarkable. There has been interval development of extensive peritoneal disease within the upper abdomen. New tumor deposit along the falciform ligament measures 3.7 cm, image 49/series 2. Tumor deposit along the inferior margin of the right hepatic lobe measures 2.5 cm, image 56/ series 2. Tumor within the left upper quadrant of the abdomen between the spleen and posterior wall of stomach measures 3.6 cm, image 51/ series 2. There is also extensive peritoneal tumor encasing the spleen. There is mild abdominal ascites present. The patient has a gastrostomy tube. The bowel loops within the upper stress set the visualized bowel loops within the upper abdomen appear nonobstructed.  The visualized osseous structures are negative for aggressive lytic or sclerotic bone lesion.  IMPRESSION: CT CHEST IMPRESSION  1. Interval development of bilateral pleural effusions left greater than right. Stable to slight increase in size of peripherally enhancing pleural nodules within the left hemi thorax.  2. Increase in size of pulmonary nodularity in the right lung.  3. Progression of mediastinal lymph node metastasis.  4. Primary esophageal tumor is decrease in size from previous exam.  CT ABDOMEN AND PELVIS IMPRESSION  1. Marked progression of tumor within the upper abdomen. The previously described gastrohepatic ligament lymph node has markedly increased in size from previous exam and now invades the caudate lobe of liver and left hepatic lobe.  2. Significant progression of peritoneal tumor within the upper abdomen.   Electronically Signed   By: Signa Kell M.D.   On: 02/04/2013 17:08   Ct Abdomen W  Contrast  02/04/2013   CLINICAL DATA:  restaging esophageal cancer  EXAM: CT CHEST, ABDOMEN WITH CONTRAST  TECHNIQUE: Multidetector CT imaging of the chest, abdomen was performed following the standard protocol during bolus administration of intravenous contrast.  CONTRAST:  OMNIPAQUE IOHEXOL 300 MG/ML  SOLN  COMPARISON:  CT 11/13/2012  FINDINGS: CT CHEST FINDINGS  There are bilateral pleural effusions identified, left-greater-than-right. These are new from previous exam. There is an enhancing nodule within the posterior, medial left lung pleura measuring 1.6 cm, image 33/ series 2. This is compared with 1.5 cm previously. Enhancing pleural nodule just anterior to the descending aorta measures 1.5 cm, image 35/ series 2. Previously 1.4 cm. The left pleural effusion has increased in volume from the previous exam. New right pleural effusion.  New nodule within the right lower lobe measures 1.2 cm, image 32/series 5. Right middle lobe nodule measures 1.2 cm, image 27/ series 5. This is compared with 0.6 cm previously. New left upper lobe nodule is identified measuring 3 mm, image 27/ series 5.  Normal appearance of the heart size. No pericardial effusion. Enlarged right paratracheal lymph node measures 1.9 cm, image 10/ series 2. This is compared with 1.6 cm previously. Pre-vascular lymph node within the superior mediastinum measures  1.4 cm, image 11/ series 2. This is new from previous exam. Thickened distal esophagus now measures 2.7 x 2.6 cm, image 35/ series 2. This previously measured 4.0 x1.4 cm. Review of the visualized osseous structures is negative for aggressive lytic or sclerotic bone lesion.  CT ABDOMEN FINDINGS  There has been marked increase in size of tumor within the gastrohepatic ligament. This now invades the caudate lobe of liver and inferior aspect of the left hepatic lobe. The tumor currently measures 8.2 cm, image 49/ series 2. Previously 3.8 cm. Gallbladder appears normal. No significant  biliary dilatation. The pancreas is unremarkable. The adrenal glands are both normal.  Both kidneys are unremarkable. There has been interval development of extensive peritoneal disease within the upper abdomen. New tumor deposit along the falciform ligament measures 3.7 cm, image 49/series 2. Tumor deposit along the inferior margin of the right hepatic lobe measures 2.5 cm, image 56/ series 2. Tumor within the left upper quadrant of the abdomen between the spleen and posterior wall of stomach measures 3.6 cm, image 51/ series 2. There is also extensive peritoneal tumor encasing the spleen. There is mild abdominal ascites present. The patient has a gastrostomy tube. The bowel loops within the upper stress set the visualized bowel loops within the upper abdomen appear nonobstructed.  The visualized osseous structures are negative for aggressive lytic or sclerotic bone lesion.  IMPRESSION: CT CHEST IMPRESSION  1. Interval development of bilateral pleural effusions left greater than right. Stable to slight increase in size of peripherally enhancing pleural nodules within the left hemi thorax.  2. Increase in size of pulmonary nodularity in the right lung.  3. Progression of mediastinal lymph node metastasis.  4. Primary esophageal tumor is decrease in size from previous exam.  CT ABDOMEN AND PELVIS IMPRESSION  1. Marked progression of tumor within the upper abdomen. The previously described gastrohepatic ligament lymph node has markedly increased in size from previous exam and now invades the caudate lobe of liver and left hepatic lobe.  2. Significant progression of peritoneal tumor within the upper abdomen.   Electronically Signed   By: Signa Kell M.D.   On: 02/04/2013 17:08    Medications: I have reviewed the patient's current medications.  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  -PET scan 12/01/2012  confirmed a hypermetabolic distal esophagus mass, hypermetabolic right paratracheal lymph node, hypermetabolic right liver lesion, and hypermetabolic upper abdominal lymphadenopathy.  -completed palliative radiation 12/25/2012.  -Status post cycle 1 Taxol/carboplatin 02/09/2013. 2. Dysphagia secondary to #1, status post placement of a gastrostomy tube in radiology on 11/17/2012, he continues tube feedings.  3. Odynophagia/upper abdominal pain secondary to the esophagus mass, radiation esophagitis, ? Liver metastases and adenopathy. He continues to have abdominal pain.  4. Hypercalcemia of malignancy-status post Zometa 01/14/2013, 02/04/2013 and 02/11/2013. Status post calcitonin, IV fluids and Xgeva on 02/18/2013. Status post calcitonin and IV fluids on 02/19/2013. Status post IV fluids on 02/20/2013. 5. Dyspnea-etiology unclear. Chest x-ray 12/30/2012 showed a small unilateral pleural effusion favored to be right-sided; right hemidiaphragm elevation with adjacent volume loss/atelectasis; right paratracheal soft tissue fullness. He did not complain of dyspnea today.  Disposition-Mr. Ley's status appears unchanged. He has persistent malignant hypercalcemia. Dr. Truett Perna recommends changing chemotherapy to the FOLFOX regimen. Potential toxicities were reviewed including myelosuppression, nausea, allergic reaction. We discussed potential toxicities associated of 5-fluorouracil including mouth sores, diarrhea, skin hyperpigmentation, hand-foot syndrome, skin rash. We discussed the  neurotoxicity associated with oxaliplatin. He will return to begin cycle 1 on 03/01/2013.  He continues to have hypercalcemia. The calcium level is stable as compared to 3 days ago. We will have him return for a repeat chemistry panel on 02/25/2013.  We will see him in followup with cycle 2 FOLFOX. He understands to contact the office in the interim with any problems.  30 minutes were spent face-to-face at today's visit with  greater than 50% of that time involved in counseling/coordination of care.  Patient seen with Dr. Truett Perna.   Lonna Cobb ANP/GNP-BC  This was a shared visit with Lonna Cobb. Mr. Dilger was interviewed and examined. There is persistent hypercalcemia and abdominal pain. There is no evidence of a response to the Taxol/carboplatin. We decided to make a change to FOLFOX. We reviewed the potential toxicities associated with FOLFOX with the assistance of an interpreter. He agrees to proceed.  We will check a calcium level later this week and repeat treatment with IV fluids and calcitonin as indicated.  Mancel Bale, M.D.

## 2013-02-23 ENCOUNTER — Ambulatory Visit: Payer: Self-pay

## 2013-02-25 ENCOUNTER — Telehealth: Payer: Self-pay | Admitting: Oncology

## 2013-02-25 ENCOUNTER — Other Ambulatory Visit (HOSPITAL_BASED_OUTPATIENT_CLINIC_OR_DEPARTMENT_OTHER): Payer: Self-pay | Admitting: Lab

## 2013-02-25 ENCOUNTER — Telehealth: Payer: Self-pay | Admitting: *Deleted

## 2013-02-25 ENCOUNTER — Ambulatory Visit (HOSPITAL_BASED_OUTPATIENT_CLINIC_OR_DEPARTMENT_OTHER): Payer: Self-pay

## 2013-02-25 DIAGNOSIS — C155 Malignant neoplasm of lower third of esophagus: Secondary | ICD-10-CM

## 2013-02-25 DIAGNOSIS — Z5189 Encounter for other specified aftercare: Secondary | ICD-10-CM

## 2013-02-25 LAB — COMPREHENSIVE METABOLIC PANEL (CC13)
ALT: 16 U/L (ref 0–55)
AST: 24 U/L (ref 5–34)
Alkaline Phosphatase: 239 U/L — ABNORMAL HIGH (ref 40–150)
BUN: 34.2 mg/dL — ABNORMAL HIGH (ref 7.0–26.0)
CO2: 23 mEq/L (ref 22–29)
Chloride: 105 mEq/L (ref 98–109)
Creatinine: 1.4 mg/dL — ABNORMAL HIGH (ref 0.7–1.3)
Potassium: 4.1 mEq/L (ref 3.5–5.1)

## 2013-02-25 MED ORDER — MORPHINE SULFATE 4 MG/ML IJ SOLN
INTRAMUSCULAR | Status: AC
  Filled 2013-02-25: qty 1

## 2013-02-25 MED ORDER — SODIUM CHLORIDE 0.9 % IV SOLN: Freq: Once | INTRAVENOUS | Status: DC

## 2013-02-25 MED ORDER — MORPHINE SULFATE 4 MG/ML IJ SOLN
2.0000 mg | Freq: Once | INTRAMUSCULAR | Status: AC
  Administered 2013-02-25: 4 mg via INTRAVENOUS

## 2013-02-25 MED ORDER — SODIUM CHLORIDE 0.9 % IV SOLN: INTRAVENOUS | Status: DC

## 2013-02-25 MED ORDER — HEPARIN SOD (PORK) LOCK FLUSH 100 UNIT/ML IV SOLN
500.0000 [IU] | Freq: Once | INTRAVENOUS | Status: DC
  Filled 2013-02-25: qty 5

## 2013-02-25 MED ORDER — SODIUM CHLORIDE 0.9 % IV SOLN
Freq: Once | INTRAVENOUS | Status: AC
  Administered 2013-02-25: 15:00:00 via INTRAVENOUS

## 2013-02-25 MED ORDER — SODIUM CHLORIDE 0.9 % IJ SOLN
10.0000 mL | INTRAMUSCULAR | Status: DC | PRN
  Filled 2013-02-25: qty 10

## 2013-02-25 NOTE — Patient Instructions (Addendum)
Dehydration, Adult Hypercalcemia Hypercalcemia means the calcium in your blood is too high. A level above 10.5 milligrams per deciliter of blood is considered high. Calcium in our blood is important for the control of many things, such as:  Blood clotting.  Conducting of nerve impulses.  Muscle contraction.  Maintaining teeth and bone health.  Other body functions. In the bloodstream, calcium maintains a constant balance with another mineral, phosphate. Calcium is absorbed into the body through the small intestine. This is helped by Vitamin D. Calcium levels are maintained mostly by vitamin D and a hormone (parathyroid hormone). But the kidneys also help. Hypercalcemia can happen when the concentration of calcium is too high for the kidneys to maintain balance. The body maintains a balance between the calcium we eat and the calcium already in our body. If calcium intake is increased or we cannot use calcium properly, there may be problems. Some common sources of calcium are:   Dairy products.  Nuts.  Eggs.  Whole grains.  Legumes.  Green leafy vegetables. CAUSES There are many causes of this condition, but some common ones are:  Hyperparathyroidism. This is an over activity of the parathyroid gland.  Cancers of the breast, kidney, lung, head and neck are common causes of calcium increases.  Medications that cause you to urinate more often (diuretics), nausea, vomiting and diarrhea also increase the calcium in the blood.  Overuse of calcium-containing antacids. SYMPTOMS  Many patients with mild hypercalcemia have no symptoms. For those with symptoms common problems include:  Loss of appetite.  Constipation.  Increased thirst.  Heart rhythm changes.  Abnormal thinking.  Nausea.  Abdominal pain.  Kidney stones.  Mood swings.  Coma and death when severe.  Vomiting.  Increased urination.  High blood pressure.  Confusion. DIAGNOSIS   Your caregiver will  do a medical history and perform a physical exam on you.  Calcium and parathyroid hormone (PTH) may be measured with a blood test. TREATMENT   The treatment depends on the calcium level and what is causing the higher level. Hypercalcemia can be lifethreatening. Fast lowering of the calcium level may be necessary.  With normal kidney function, fluids can be given by vein to clear the excess calcium. Hemodialysis works well to reduce dangerous calcium levels if there is poor kidney function. This is a procedure in which a machine is used to filter out unwanted substances. The blood is then returned to the body.  Drugs, such as diuretics, can be given after adequate fluid intake is established. These medications help the kidneys get rid of extra calcium. Drugs that lessen (inhibit) bone loss are helpful in gaining long-term control. Phosphate pills help lower high calcium levels caused by a low supply of phosphate. Anti-inflammatory agents such as steroids are helpful with some cancers and toxic levels of vitamin D.  Treatment of the underlying cause of the hypercalcemia will also correct the imbalance. Hyperparathyroidism is usually treated by surgical removal of one or more of the parathyroid glands and any tissue, other than the glands themselves, that is producing too much hormone.  The hypercalcemia caused by cancer is difficult to treat without controlling the cancer. Symptoms can be improved with fluids and drug therapy as outlined above. PROGNOSIS   Surgery to remove the parathyroid glands is usually successful. This also depends on the amount of damage to the kidneys and whether or not it can be treated.  Mild hypercalcemia can be controlled with good fluid intake and the use of effective  medications.  Hypercalcemia often develops as a late complication of cancer. The expected outlook is poor without effective anticancer therapy. PREVENTION   If you are at risk for developing  hypercalcemia, be familiar with early symptoms. Report these to your caregiver.  Good fluid intake (up to four quarts of liquid a day if possible) is helpful.  Try to control nausea and vomiting, and treat fevers to avoid dehydration.  Lowering the amount of calcium in your diet is not necessary. High blood calcium reduces absorption of calcium in the intestine.  Stay as active as possible. SEEK IMMEDIATE MEDICAL CARE IF:   You develop chest pain, sweating, or shortness of breath.  You get confused, feel faint or pass out.  You develop severe nausea and vomiting. MAKE SURE YOU:   Understand these instructions.  Will watch your condition.  Will get help right away if you are not doing well or get worse. Document Released: 07/06/2004 Document Revised: 07/15/2011 Document Reviewed: 04/17/2010 [facility] Patient Information 2014 [place], [place].

## 2013-02-25 NOTE — Telephone Encounter (Signed)
Called [facility] regarding chemo on 10/27 and 11/10

## 2013-02-25 NOTE — Telephone Encounter (Signed)
Chemo moved to 10/28 per Marcelino Duster , no room on 10/27, nurse aware

## 2013-02-25 NOTE — Telephone Encounter (Signed)
Pt in for lab only today, CMET reviewed by Lonna Cobb, NP. Hypercalcemic. Orders received for IV Fluids and Calcitonin. Unable to locate pt in lobby. Called pt's son, no answer. Called home via [employer], wife reports pt is unable to come in to office until 4PM when son is home from work. Instructed wife to bring pt in at [facility] for IV Fluids and Calcitonin. She voiced understanding.

## 2013-02-26 ENCOUNTER — Telehealth: Payer: Self-pay | Admitting: Oncology

## 2013-02-26 ENCOUNTER — Ambulatory Visit (HOSPITAL_BASED_OUTPATIENT_CLINIC_OR_DEPARTMENT_OTHER): Payer: Self-pay

## 2013-02-26 MED ORDER — SODIUM CHLORIDE 0.9 % IV SOLN
Freq: Once | INTRAVENOUS | Status: AC
  Administered 2013-02-26: 11:00:00 via INTRAVENOUS

## 2013-02-26 MED ORDER — CALCITONIN (SALMON) 200 UNIT/ML IJ SOLN
200.0000 [IU] | Freq: Every day | INTRAMUSCULAR | Status: DC
  Administered 2013-02-26: 200 [IU] via SUBCUTANEOUS
  Filled 2013-02-26: qty 1

## 2013-02-26 NOTE — Progress Notes (Signed)
Supportive plan D/Ced due to inability to close encounter 10/23 and 10/24 for billing purposes.

## 2013-02-26 NOTE — Telephone Encounter (Signed)
pt IVF was moved to 10/28 chemo room full , nurse aware

## 2013-02-27 ENCOUNTER — Ambulatory Visit (HOSPITAL_BASED_OUTPATIENT_CLINIC_OR_DEPARTMENT_OTHER): Payer: Self-pay

## 2013-02-27 ENCOUNTER — Other Ambulatory Visit: Payer: Self-pay | Admitting: Internal Medicine

## 2013-02-27 MED ORDER — HEPARIN SOD (PORK) LOCK FLUSH 100 UNIT/ML IV SOLN
500.0000 [IU] | Freq: Once | INTRAVENOUS | Status: AC
  Administered 2013-02-27: 500 [IU] via INTRAVENOUS
  Filled 2013-02-27: qty 5

## 2013-02-27 MED ORDER — CALCITONIN (SALMON) 200 UNIT/ML IJ SOLN: 200.0000 [IU] | Freq: Every day | INTRAMUSCULAR | Status: AC

## 2013-02-27 MED ORDER — CALCITONIN (SALMON) 200 UNIT/ML IJ SOLN: 200.0000 [IU] | Freq: Once | INTRAMUSCULAR | Status: DC

## 2013-02-27 MED ORDER — SODIUM CHLORIDE 0.9 % IV SOLN
Freq: Once | INTRAVENOUS | Status: AC
  Administered 2013-02-27: 10:00:00 via INTRAVENOUS

## 2013-02-27 MED ORDER — SODIUM CHLORIDE 0.9 % IJ SOLN
10.0000 mL | INTRAMUSCULAR | Status: DC | PRN
  Administered 2013-02-27: 10 mL via INTRAVENOUS
  Filled 2013-02-27: qty 10

## 2013-02-27 MED ORDER — CALCITONIN (SALMON) 200 UNIT/ML IJ SOLN
200.0000 [IU] | Freq: Once | INTRAMUSCULAR | Status: AC
  Administered 2013-02-27: 200 [IU] via SUBCUTANEOUS

## 2013-02-27 NOTE — Addendum Note (Signed)
Encounter addended by: Otho Bellows, RPH on: 02/27/2013 10:12 AM<BR>     Documentation filed: Orders, Rx Order Verification

## 2013-02-27 NOTE — Patient Instructions (Addendum)
Hypercalcemia Hypercalcemia means the calcium in your blood is too high. A level above 10.5 milligrams per deciliter of blood is considered high. Calcium in our blood is important for the control of many things, such as:  Blood clotting.  Conducting of nerve impulses.  Muscle contraction.  Maintaining teeth and bone health.  Other body functions. In the bloodstream, calcium maintains a constant balance with another mineral, phosphate. Calcium is absorbed into the body through the small intestine. This is helped by Vitamin D. Calcium levels are maintained mostly by vitamin D and a hormone (parathyroid hormone). But the kidneys also help. Hypercalcemia can happen when the concentration of calcium is too high for the kidneys to maintain balance. The body maintains a balance between the calcium we eat and the calcium already in our body. If calcium intake is increased or we cannot use calcium properly, there may be problems. Some common sources of calcium are:   Dairy products.  Nuts.  Eggs.  Whole grains.  Legumes.  Green leafy vegetables. CAUSES There are many causes of this condition, but some common ones are:  Hyperparathyroidism. This is an over activity of the parathyroid gland.  Cancers of the breast, kidney, lung, head and neck are common causes of calcium increases.  Medications that cause you to urinate more often (diuretics), nausea, vomiting and diarrhea also increase the calcium in the blood.  Overuse of calcium-containing antacids. SYMPTOMS  Many patients with mild hypercalcemia have no symptoms. For those with symptoms common problems include:  Loss of appetite.  Constipation.  Increased thirst.  Heart rhythm changes.  Abnormal thinking.  Nausea.  Abdominal pain.  Kidney stones.  Mood swings.  Coma and death when severe.  Vomiting.  Increased urination.  High blood pressure.  Confusion. DIAGNOSIS   Your caregiver will do a medical history  and perform a physical exam on you.  Calcium and parathyroid hormone (PTH) may be measured with a blood test. TREATMENT   The treatment depends on the calcium level and what is causing the higher level. Hypercalcemia can be lifethreatening. Fast lowering of the calcium level may be necessary.  With normal kidney function, fluids can be given by vein to clear the excess calcium. Hemodialysis works well to reduce dangerous calcium levels if there is poor kidney function. This is a procedure in which a machine is used to filter out unwanted substances. The blood is then returned to the body.  Drugs, such as diuretics, can be given after adequate fluid intake is established. These medications help the kidneys get rid of extra calcium. Drugs that lessen (inhibit) bone loss are helpful in gaining long-term control. Phosphate pills help lower high calcium levels caused by a low supply of phosphate. Anti-inflammatory agents such as steroids are helpful with some cancers and toxic levels of vitamin D.  Treatment of the underlying cause of the hypercalcemia will also correct the imbalance. Hyperparathyroidism is usually treated by surgical removal of one or more of the parathyroid glands and any tissue, other than the glands themselves, that is producing too much hormone.  The hypercalcemia caused by cancer is difficult to treat without controlling the cancer. Symptoms can be improved with fluids and drug therapy as outlined above. PROGNOSIS   Surgery to remove the parathyroid glands is usually successful. This also depends on the amount of damage to the kidneys and whether or not it can be treated.  Mild hypercalcemia can be controlled with good fluid intake and the use of effective medications.  Hypercalcemia often develops as a late complication of cancer. The expected outlook is poor without effective anticancer therapy. PREVENTION   If you are at risk for developing hypercalcemia, be familiar with  early symptoms. Report these to your caregiver.  Good fluid intake (up to four quarts of liquid a day if possible) is helpful.  Try to control nausea and vomiting, and treat fevers to avoid dehydration.  Lowering the amount of calcium in your diet is not necessary. High blood calcium reduces absorption of calcium in the intestine.  Stay as active as possible. SEEK IMMEDIATE MEDICAL CARE IF:   You develop chest pain, sweating, or shortness of breath.  You get confused, feel faint or pass out.  You develop severe nausea and vomiting. MAKE SURE YOU:   Understand these instructions.  Will watch your condition.  Will get help right away if you are not doing well or get worse. Document Released: 07/06/2004 Document Revised: 07/15/2011 Document Reviewed: 04/17/2010 [facility] Patient Information 2014 [place], [place]. Dehydration, Adult Dehydration is when you lose more fluids from the body than you take in. Vital organs like the kidneys, brain, and heart cannot function without a proper amount of fluids and salt. Any loss of fluids from the body can cause dehydration.  CAUSES   Vomiting.  Diarrhea.  Excessive sweating.  Excessive urine output.  Fever. SYMPTOMS  Mild dehydration  Thirst.  Dry lips.  Slightly dry mouth. Moderate dehydration  Very dry mouth.  Sunken eyes.  Skin does not bounce back quickly when lightly pinched and released.  Dark urine and decreased urine production.  Decreased tear production.  Headache. Severe dehydration  Very dry mouth.  Extreme thirst.  Rapid, weak pulse (more than 100 beats per minute at rest).  Cold hands and feet.  Not able to sweat in spite of heat and temperature.  Rapid breathing.  Blue lips.  Confusion and lethargy.  Difficulty being awakened.  Minimal urine production.  No tears. DIAGNOSIS  Your caregiver will diagnose dehydration based on your symptoms and your exam. Blood and urine tests will help  confirm the diagnosis. The diagnostic evaluation should also identify the cause of dehydration. TREATMENT  Treatment of mild or moderate dehydration can often be done at home by increasing the amount of fluids that you drink. It is best to drink small amounts of fluid more often. Drinking too much at one time can make vomiting worse. Refer to the home care instructions below. Severe dehydration needs to be treated at the hospital where you will probably be given intravenous (IV) fluids that contain water and electrolytes. HOME CARE INSTRUCTIONS   Ask your caregiver about specific rehydration instructions.  Drink enough fluids to keep your urine clear or pale yellow.  Drink small amounts frequently if you have nausea and vomiting.  Eat as you normally do.  Avoid:  Foods or drinks high in sugar.  Carbonated drinks.  Juice.  Extremely hot or cold fluids.  Drinks with caffeine.  Fatty, greasy foods.  Alcohol.  Tobacco.  Overeating.  Gelatin desserts.  Wash your hands well to avoid spreading bacteria and viruses.  Only take over-the-counter or prescription medicines for pain, discomfort, or fever as directed by your caregiver.  Ask your caregiver if you should continue all prescribed and over-the-counter medicines.  Keep all follow-up appointments with your caregiver. SEEK MEDICAL CARE IF:  You have abdominal pain and it increases or stays in one area (localizes).  You have a rash, stiff neck, or severe headache.  You are irritable, sleepy, or difficult to awaken.  You are weak, dizzy, or extremely thirsty. SEEK IMMEDIATE MEDICAL CARE IF:   You are unable to keep fluids down or you get worse despite treatment.  You have frequent episodes of vomiting or diarrhea.  You have blood or green matter (bile) in your vomit.  You have blood in your stool or your stool looks black and tarry.  You have not urinated in 6 to 8 hours, or you have only urinated a small amount  of very dark urine.  You have a fever.  You faint. MAKE SURE YOU:   Understand these instructions.  Will watch your condition.  Will get help right away if you are not doing well or get worse. Document Released: 04/22/2005 Document Revised: 07/15/2011 Document Reviewed: 12/10/2010 [facility] Patient Information 2014 [place], [place].

## 2013-02-27 NOTE — Addendum Note (Signed)
Addended by: Zella Ball on: 02/27/2013 10:46 AM   Modules accepted: Orders

## 2013-03-01 ENCOUNTER — Encounter: Payer: Self-pay | Admitting: *Deleted

## 2013-03-01 ENCOUNTER — Telehealth: Payer: Self-pay | Admitting: Oncology

## 2013-03-01 ENCOUNTER — Other Ambulatory Visit: Payer: Self-pay | Admitting: *Deleted

## 2013-03-01 ENCOUNTER — Other Ambulatory Visit: Payer: Self-pay

## 2013-03-01 ENCOUNTER — Other Ambulatory Visit: Payer: Self-pay | Admitting: Oncology

## 2013-03-01 ENCOUNTER — Other Ambulatory Visit (HOSPITAL_BASED_OUTPATIENT_CLINIC_OR_DEPARTMENT_OTHER): Payer: Self-pay | Admitting: Lab

## 2013-03-01 DIAGNOSIS — C155 Malignant neoplasm of lower third of esophagus: Secondary | ICD-10-CM

## 2013-03-01 DIAGNOSIS — D649 Anemia, unspecified: Secondary | ICD-10-CM

## 2013-03-01 LAB — CBC WITH DIFFERENTIAL/PLATELET
Basophils Absolute: 0 10*3/uL (ref 0.0–0.1)
EOS%: 0 % (ref 0.0–7.0)
Eosinophils Absolute: 0 10*3/uL (ref 0.0–0.5)
HGB: 7.3 g/dL — ABNORMAL LOW (ref 13.0–17.1)
LYMPH%: 4.9 % — ABNORMAL LOW (ref 14.0–49.0)
MCH: 22.6 pg — ABNORMAL LOW (ref 27.2–33.4)
MCHC: 30.9 g/dL — ABNORMAL LOW (ref 32.0–36.0)
MCV: 72.9 fL — ABNORMAL LOW (ref 79.3–98.0)
MONO%: 5.4 % (ref 0.0–14.0)
NEUT#: 15.9 10*3/uL — ABNORMAL HIGH (ref 1.5–6.5)
Platelets: 238 10*3/uL (ref 140–400)
RDW: 19.1 % — ABNORMAL HIGH (ref 11.0–14.6)

## 2013-03-01 LAB — COMPREHENSIVE METABOLIC PANEL (CC13)
AST: 23 U/L (ref 5–34)
Alkaline Phosphatase: 225 U/L — ABNORMAL HIGH (ref 40–150)
Anion Gap: 9 mEq/L (ref 3–11)
BUN: 30.2 mg/dL — ABNORMAL HIGH (ref 7.0–26.0)
Creatinine: 1.4 mg/dL — ABNORMAL HIGH (ref 0.7–1.3)
Glucose: 213 mg/dl — ABNORMAL HIGH (ref 70–140)
Potassium: 4.5 mEq/L (ref 3.5–5.1)
Total Bilirubin: 0.44 mg/dL (ref 0.20–1.20)
Total Protein: 8.6 g/dL — ABNORMAL HIGH (ref 6.4–8.3)

## 2013-03-01 NOTE — Telephone Encounter (Signed)
printed new sched for pt

## 2013-03-01 NOTE — Progress Notes (Signed)
MD reviewed Cmet results of today. Ca+ level 13.3. Will proceed with FOLFOX 10/28 and check stat Cmet on 10/30 and give IVF with calcitonin if needed. POF to scheduler.

## 2013-03-02 ENCOUNTER — Ambulatory Visit: Payer: Self-pay

## 2013-03-02 ENCOUNTER — Ambulatory Visit (HOSPITAL_BASED_OUTPATIENT_CLINIC_OR_DEPARTMENT_OTHER): Payer: Self-pay

## 2013-03-02 ENCOUNTER — Encounter: Payer: Self-pay | Admitting: *Deleted

## 2013-03-02 VITALS — BP 138/67 | HR 126 | Temp 97.3°F | Resp 20

## 2013-03-02 DIAGNOSIS — C155 Malignant neoplasm of lower third of esophagus: Secondary | ICD-10-CM

## 2013-03-02 DIAGNOSIS — Z5111 Encounter for antineoplastic chemotherapy: Secondary | ICD-10-CM

## 2013-03-02 MED ORDER — ONDANSETRON 8 MG/50ML IVPB (CHCC)
8.0000 mg | Freq: Once | INTRAVENOUS | Status: AC
  Administered 2013-03-02: 8 mg via INTRAVENOUS

## 2013-03-02 MED ORDER — SODIUM CHLORIDE 0.9 % IV SOLN
2400.0000 mg/m2 | INTRAVENOUS | Status: DC
  Administered 2013-03-02: 3700 mg via INTRAVENOUS
  Filled 2013-03-02: qty 74

## 2013-03-02 MED ORDER — LEUCOVORIN CALCIUM INJECTION 350 MG
400.0000 mg/m2 | Freq: Once | INTRAVENOUS | Status: AC
  Administered 2013-03-02: 620 mg via INTRAVENOUS
  Filled 2013-03-02: qty 31

## 2013-03-02 MED ORDER — ONDANSETRON 8 MG/NS 50 ML IVPB
INTRAVENOUS | Status: AC
  Filled 2013-03-02: qty 8

## 2013-03-02 MED ORDER — DEXAMETHASONE SODIUM PHOSPHATE 10 MG/ML IJ SOLN
INTRAMUSCULAR | Status: AC
  Filled 2013-03-02: qty 1

## 2013-03-02 MED ORDER — DEXAMETHASONE SODIUM PHOSPHATE 10 MG/ML IJ SOLN
10.0000 mg | Freq: Once | INTRAMUSCULAR | Status: AC
  Administered 2013-03-02: 10 mg via INTRAVENOUS

## 2013-03-02 MED ORDER — DEXTROSE 5 % IV SOLN
Freq: Once | INTRAVENOUS | Status: AC
  Administered 2013-03-02: 09:00:00 via INTRAVENOUS

## 2013-03-02 MED ORDER — OXALIPLATIN CHEMO INJECTION 100 MG/20ML
85.0000 mg/m2 | Freq: Once | INTRAVENOUS | Status: AC
  Administered 2013-03-02: 130 mg via INTRAVENOUS
  Filled 2013-03-02: qty 26

## 2013-03-02 MED ORDER — FLUOROURACIL CHEMO INJECTION 2.5 GM/50ML
400.0000 mg/m2 | Freq: Once | INTRAVENOUS | Status: AC
  Administered 2013-03-02: 600 mg via INTRAVENOUS
  Filled 2013-03-02: qty 12

## 2013-03-02 NOTE — Progress Notes (Signed)
Discharged via wheelchair at 1245 with son-in-law and interpreter, Fayrene Fearing.  No distress.

## 2013-03-02 NOTE — Patient Instructions (Signed)
[facility] Discharge Instructions for Patients Receiving Chemotherapy  Today you received the following chemotherapy agents 5FU, oxaliplatin, leucovorin.  To help prevent nausea and vomiting after your treatment, we encourage you to take your nausea medication Zofran ODT 8 mg every 8 hrs.   If you develop nausea and vomiting that is not controlled by your nausea medication, call the clinic.   BELOW ARE SYMPTOMS THAT SHOULD BE REPORTED IMMEDIATELY:  *FEVER GREATER THAN 100.5 F  *CHILLS WITH OR WITHOUT FEVER  NAUSEA AND VOMITING THAT IS NOT CONTROLLED WITH YOUR NAUSEA MEDICATION  *UNUSUAL SHORTNESS OF BREATH  *UNUSUAL BRUISING OR BLEEDING  TENDERNESS IN MOUTH AND THROAT WITH OR WITHOUT PRESENCE OF ULCERS  *URINARY PROBLEMS  *BOWEL PROBLEMS  UNUSUAL RASH Items with * indicate a potential emergency and should be followed up as soon as possible.  Feel free to call the clinic you have any questions or concerns. The clinic phone number is [phone].

## 2013-03-02 NOTE — Progress Notes (Signed)
Patient ambulated with assistance to the bathroom at the end of treatment.  Tolerated fanny pack well.  Discussed treatment with son-in-law about cold resistance, GI symptoms and spill kit.  Denies questions at this time.

## 2013-03-03 ENCOUNTER — Telehealth: Payer: Self-pay | Admitting: *Deleted

## 2013-03-03 ENCOUNTER — Telehealth: Payer: Self-pay | Admitting: Oncology

## 2013-03-03 ENCOUNTER — Ambulatory Visit: Payer: Self-pay

## 2013-03-03 NOTE — Telephone Encounter (Signed)
Called pt and left message regarding appt for lab and pump dc, advised pt to get appt calendar appt

## 2013-03-03 NOTE — Telephone Encounter (Signed)
Spoke with son, [name].  His dad is doing ok.  Denies nausea, vomiting,diarrhea.  He has not needed his nausea medicine.  He is taking small sips of milk, rice and soup. He is not having any pain and he slept well.  Answered questions from son and wife.  They know to come in tomorrow at 10:30am.  Reminded them if they have problems to call us.

## 2013-03-04 ENCOUNTER — Ambulatory Visit: Payer: Self-pay

## 2013-03-04 ENCOUNTER — Ambulatory Visit (HOSPITAL_BASED_OUTPATIENT_CLINIC_OR_DEPARTMENT_OTHER): Payer: Self-pay

## 2013-03-04 ENCOUNTER — Other Ambulatory Visit (HOSPITAL_BASED_OUTPATIENT_CLINIC_OR_DEPARTMENT_OTHER): Payer: Self-pay | Admitting: Lab

## 2013-03-04 ENCOUNTER — Other Ambulatory Visit: Payer: Self-pay | Admitting: Oncology

## 2013-03-04 VITALS — BP 148/80 | HR 125 | Temp 97.0°F | Resp 28

## 2013-03-04 DIAGNOSIS — C155 Malignant neoplasm of lower third of esophagus: Secondary | ICD-10-CM

## 2013-03-04 DIAGNOSIS — D649 Anemia, unspecified: Secondary | ICD-10-CM

## 2013-03-04 LAB — CBC WITH DIFFERENTIAL/PLATELET
BASO%: 0 % (ref 0.0–2.0)
Eosinophils Absolute: 0 10*3/uL (ref 0.0–0.5)
HCT: 24.8 % — ABNORMAL LOW (ref 38.4–49.9)
LYMPH%: 2.9 % — ABNORMAL LOW (ref 14.0–49.0)
MCH: 22.6 pg — ABNORMAL LOW (ref 27.2–33.4)
MCHC: 31 g/dL — ABNORMAL LOW (ref 32.0–36.0)
MCV: 72.9 fL — ABNORMAL LOW (ref 79.3–98.0)
MONO#: 2.3 10*3/uL — ABNORMAL HIGH (ref 0.1–0.9)
MONO%: 12.8 % (ref 0.0–14.0)
NEUT#: 15.2 10*3/uL — ABNORMAL HIGH (ref 1.5–6.5)
NEUT%: 84.3 % — ABNORMAL HIGH (ref 39.0–75.0)
Platelets: 257 10*3/uL (ref 140–400)
RBC: 3.4 10*6/uL — ABNORMAL LOW (ref 4.20–5.82)

## 2013-03-04 LAB — COMPREHENSIVE METABOLIC PANEL (CC13)
ALT: 21 U/L (ref 0–55)
AST: 36 U/L — ABNORMAL HIGH (ref 5–34)
Albumin: 2.5 g/dL — ABNORMAL LOW (ref 3.5–5.0)
Alkaline Phosphatase: 235 U/L — ABNORMAL HIGH (ref 40–150)
Chloride: 104 mEq/L (ref 98–109)
Glucose: 101 mg/dl (ref 70–140)
Potassium: 4.2 mEq/L (ref 3.5–5.1)
Sodium: 131 mEq/L — ABNORMAL LOW (ref 136–145)
Total Bilirubin: 0.53 mg/dL (ref 0.20–1.20)
Total Protein: 8.7 g/dL — ABNORMAL HIGH (ref 6.4–8.3)

## 2013-03-04 LAB — HOLD TUBE, BLOOD BANK

## 2013-03-04 MED ORDER — SODIUM CHLORIDE 0.9 % IJ SOLN
10.0000 mL | INTRAMUSCULAR | Status: DC | PRN
  Administered 2013-03-04: 10 mL
  Filled 2013-03-04: qty 10

## 2013-03-04 MED ORDER — SODIUM CHLORIDE 0.9 % IV SOLN
INTRAVENOUS | Status: AC
  Administered 2013-03-04: 11:00:00 via INTRAVENOUS

## 2013-03-04 MED ORDER — HEPARIN SOD (PORK) LOCK FLUSH 100 UNIT/ML IV SOLN
500.0000 [IU] | Freq: Once | INTRAVENOUS | Status: AC | PRN
  Administered 2013-03-04: 500 [IU]
  Filled 2013-03-04: qty 5

## 2013-03-04 NOTE — Progress Notes (Signed)
CBC and CMET reviewed by Dr. Truett Perna. IV Fluids only today. Will not need transfusion or calcitonin.

## 2013-03-05 ENCOUNTER — Other Ambulatory Visit: Payer: Self-pay | Admitting: *Deleted

## 2013-03-05 DIAGNOSIS — C155 Malignant neoplasm of lower third of esophagus: Secondary | ICD-10-CM

## 2013-03-05 MED ORDER — MORPHINE SULFATE 10 MG/5ML PO SOLN: 10.0000 mg | ORAL | Status: DC | PRN

## 2013-03-05 MED ORDER — METOCLOPRAMIDE HCL 5 MG/5ML PO SOLN: ORAL | Status: DC

## 2013-03-08 ENCOUNTER — Telehealth: Payer: Self-pay | Admitting: *Deleted

## 2013-03-08 DIAGNOSIS — C155 Malignant neoplasm of lower third of esophagus: Secondary | ICD-10-CM

## 2013-03-08 MED FILL — Calcitonin (Salmon) Inj 200 Unit/ML: INTRAMUSCULAR | Qty: 1 | Status: AC

## 2013-03-08 NOTE — Telephone Encounter (Signed)
Called pt via [employer] [phone]. Spoke with daughter in Social worker. Informed them of lab and infusion 11/4. Instructed them to call office if he develops confusion. She voiced understanding.

## 2013-03-09 ENCOUNTER — Other Ambulatory Visit: Payer: Self-pay | Admitting: *Deleted

## 2013-03-09 ENCOUNTER — Other Ambulatory Visit (HOSPITAL_BASED_OUTPATIENT_CLINIC_OR_DEPARTMENT_OTHER): Payer: Self-pay | Admitting: Lab

## 2013-03-09 ENCOUNTER — Ambulatory Visit (HOSPITAL_BASED_OUTPATIENT_CLINIC_OR_DEPARTMENT_OTHER): Payer: Self-pay

## 2013-03-09 DIAGNOSIS — C155 Malignant neoplasm of lower third of esophagus: Secondary | ICD-10-CM

## 2013-03-09 LAB — COMPREHENSIVE METABOLIC PANEL (CC13)
ALT: 14 U/L (ref 0–55)
Alkaline Phosphatase: 195 U/L — ABNORMAL HIGH (ref 40–150)
Anion Gap: 13 mEq/L — ABNORMAL HIGH (ref 3–11)
BUN: 22.8 mg/dL (ref 7.0–26.0)
CO2: 17 mEq/L — ABNORMAL LOW (ref 22–29)
Creatinine: 1 mg/dL (ref 0.7–1.3)
Total Bilirubin: 0.38 mg/dL (ref 0.20–1.20)

## 2013-03-09 MED ORDER — OMEPRAZOLE 2 MG/ML ORAL SUSPENSION: 20.0000 mg | Freq: Every day | ORAL | Status: DC

## 2013-03-09 MED ORDER — SODIUM CHLORIDE 0.9 % IV SOLN
Freq: Once | INTRAVENOUS | Status: AC
  Administered 2013-03-09: 16:00:00 via INTRAVENOUS

## 2013-03-09 MED ORDER — SODIUM CHLORIDE 0.45 % IV SOLN
INTRAVENOUS | Status: DC
  Filled 2013-03-09: qty 1000

## 2013-03-11 ENCOUNTER — Other Ambulatory Visit: Payer: Self-pay

## 2013-03-14 ENCOUNTER — Other Ambulatory Visit: Payer: Self-pay | Admitting: Oncology

## 2013-03-15 ENCOUNTER — Ambulatory Visit (HOSPITAL_BASED_OUTPATIENT_CLINIC_OR_DEPARTMENT_OTHER): Payer: Self-pay

## 2013-03-15 ENCOUNTER — Ambulatory Visit (HOSPITAL_COMMUNITY)
Admission: RE | Admit: 2013-03-15 | Discharge: 2013-03-15 | Disposition: A | Discharge status: Home / Self Care | Payer: Self-pay | Source: Ambulatory Visit | Attending: Oncology | Admitting: Oncology

## 2013-03-15 ENCOUNTER — Telehealth: Payer: Self-pay | Admitting: Oncology

## 2013-03-15 ENCOUNTER — Other Ambulatory Visit (HOSPITAL_BASED_OUTPATIENT_CLINIC_OR_DEPARTMENT_OTHER): Payer: Self-pay | Admitting: Lab

## 2013-03-15 ENCOUNTER — Ambulatory Visit (HOSPITAL_BASED_OUTPATIENT_CLINIC_OR_DEPARTMENT_OTHER): Payer: Self-pay | Admitting: Oncology

## 2013-03-15 VITALS — BP 135/74 | HR 114 | Temp 97.3°F | Resp 19 | Ht 62.0 in | Wt 114.8 lb

## 2013-03-15 VITALS — BP 137/84 | HR 106 | Temp 97.4°F | Resp 18

## 2013-03-15 DIAGNOSIS — Z5111 Encounter for antineoplastic chemotherapy: Secondary | ICD-10-CM

## 2013-03-15 DIAGNOSIS — C787 Secondary malignant neoplasm of liver and intrahepatic bile duct: Secondary | ICD-10-CM

## 2013-03-15 DIAGNOSIS — R599 Enlarged lymph nodes, unspecified: Secondary | ICD-10-CM

## 2013-03-15 DIAGNOSIS — C155 Malignant neoplasm of lower third of esophagus: Secondary | ICD-10-CM

## 2013-03-15 DIAGNOSIS — D649 Anemia, unspecified: Secondary | ICD-10-CM

## 2013-03-15 DIAGNOSIS — R109 Unspecified abdominal pain: Secondary | ICD-10-CM

## 2013-03-15 DIAGNOSIS — D6481 Anemia due to antineoplastic chemotherapy: Secondary | ICD-10-CM

## 2013-03-15 LAB — CBC WITH DIFFERENTIAL/PLATELET
Basophils Absolute: 0 10*3/uL (ref 0.0–0.1)
Eosinophils Absolute: 0 10*3/uL (ref 0.0–0.5)
HCT: 21.9 % — ABNORMAL LOW (ref 38.4–49.9)
HGB: 6.9 g/dL — CL (ref 13.0–17.1)
MCH: 23.2 pg — ABNORMAL LOW (ref 27.2–33.4)
MONO#: 1.9 10*3/uL — ABNORMAL HIGH (ref 0.1–0.9)
NEUT#: 7.3 10*3/uL — ABNORMAL HIGH (ref 1.5–6.5)
NEUT%: 73.7 % (ref 39.0–75.0)
Platelets: 177 10*3/uL (ref 140–400)
RDW: 21 % — ABNORMAL HIGH (ref 11.0–14.6)
WBC: 10 10*3/uL (ref 4.0–10.3)
lymph#: 0.7 10*3/uL — ABNORMAL LOW (ref 0.9–3.3)

## 2013-03-15 LAB — COMPREHENSIVE METABOLIC PANEL (CC13)
Anion Gap: 9 mEq/L (ref 3–11)
BUN: 16.3 mg/dL (ref 7.0–26.0)
CO2: 17 mEq/L — ABNORMAL LOW (ref 22–29)
Calcium: 12.8 mg/dL — ABNORMAL HIGH (ref 8.4–10.4)
Chloride: 104 mEq/L (ref 98–109)
Creatinine: 1.1 mg/dL (ref 0.7–1.3)
Glucose: 147 mg/dl — ABNORMAL HIGH (ref 70–140)
Total Protein: 8 g/dL (ref 6.4–8.3)

## 2013-03-15 LAB — ABO/RH: ABO/RH(D): O POS

## 2013-03-15 MED ORDER — ZOLEDRONIC ACID 4 MG/100ML IV SOLN
4.0000 mg | Freq: Once | INTRAVENOUS | Status: AC
  Administered 2013-03-15: 4 mg via INTRAVENOUS
  Filled 2013-03-15: qty 100

## 2013-03-15 MED ORDER — SODIUM CHLORIDE 0.9 % IV SOLN
INTRAVENOUS | Status: AC
  Administered 2013-03-15: 11:00:00 via INTRAVENOUS

## 2013-03-15 MED ORDER — LEUCOVORIN CALCIUM INJECTION 350 MG
400.0000 mg/m2 | Freq: Once | INTRAVENOUS | Status: AC
  Administered 2013-03-15: 620 mg via INTRAVENOUS
  Filled 2013-03-15: qty 31

## 2013-03-15 MED ORDER — ALTEPLASE 2 MG IJ SOLR
2.0000 mg | Freq: Once | INTRAMUSCULAR | Status: DC | PRN
  Filled 2013-03-15: qty 2

## 2013-03-15 MED ORDER — MORPHINE SULFATE 10 MG/5ML PO SOLN: 20.0000 mg | ORAL | Status: DC | PRN

## 2013-03-15 MED ORDER — SODIUM CHLORIDE 0.9 % IV SOLN
2400.0000 mg/m2 | INTRAVENOUS | Status: DC
  Administered 2013-03-15: 3700 mg via INTRAVENOUS
  Filled 2013-03-15: qty 74

## 2013-03-15 MED ORDER — DEXAMETHASONE SODIUM PHOSPHATE 10 MG/ML IJ SOLN
INTRAMUSCULAR | Status: AC
  Filled 2013-03-15: qty 1

## 2013-03-15 MED ORDER — ONDANSETRON 8 MG/NS 50 ML IVPB
INTRAVENOUS | Status: AC
  Filled 2013-03-15: qty 8

## 2013-03-15 MED ORDER — DEXAMETHASONE SODIUM PHOSPHATE 10 MG/ML IJ SOLN
10.0000 mg | Freq: Once | INTRAMUSCULAR | Status: AC
  Administered 2013-03-15: 10 mg via INTRAVENOUS

## 2013-03-15 MED ORDER — FENTANYL 50 MCG/HR TD PT72: 50.0000 ug | MEDICATED_PATCH | TRANSDERMAL | Status: DC

## 2013-03-15 MED ORDER — MORPHINE SULFATE 4 MG/ML IJ SOLN
4.0000 mg | Freq: Once | INTRAMUSCULAR | Status: AC
  Administered 2013-03-15: 4 mg via INTRAVENOUS

## 2013-03-15 MED ORDER — LIDOCAINE-PRILOCAINE 2.5-2.5 % EX CREA
TOPICAL_CREAM | CUTANEOUS | Status: AC
  Filled 2013-03-15: qty 5

## 2013-03-15 MED ORDER — MORPHINE SULFATE 15 MG PO TABS
ORAL_TABLET | ORAL | Status: AC
  Filled 2013-03-15: qty 1

## 2013-03-15 MED ORDER — ONDANSETRON 8 MG/50ML IVPB (CHCC)
8.0000 mg | Freq: Once | INTRAVENOUS | Status: AC
  Administered 2013-03-15: 8 mg via INTRAVENOUS

## 2013-03-15 MED ORDER — OXALIPLATIN CHEMO INJECTION 100 MG/20ML
85.0000 mg/m2 | Freq: Once | INTRAVENOUS | Status: AC
  Administered 2013-03-15: 130 mg via INTRAVENOUS
  Filled 2013-03-15: qty 26

## 2013-03-15 MED ORDER — MORPHINE SULFATE 4 MG/ML IJ SOLN
INTRAMUSCULAR | Status: AC
  Filled 2013-03-15: qty 1

## 2013-03-15 MED ORDER — SODIUM CHLORIDE 0.9 % IV SOLN
250.0000 mL | Freq: Once | INTRAVENOUS | Status: AC
  Administered 2013-03-15: 250 mL via INTRAVENOUS

## 2013-03-15 MED ORDER — MORPHINE SULFATE 15 MG PO TABS
15.0000 mg | ORAL_TABLET | Freq: Once | ORAL | Status: AC
  Administered 2013-03-15: 15 mg via ORAL

## 2013-03-15 MED ORDER — FLUOROURACIL CHEMO INJECTION 2.5 GM/50ML
400.0000 mg/m2 | Freq: Once | INTRAVENOUS | Status: AC
  Administered 2013-03-15: 600 mg via INTRAVENOUS
  Filled 2013-03-15: qty 12

## 2013-03-15 MED ORDER — DEXTROSE 5 % IV SOLN
Freq: Once | INTRAVENOUS | Status: AC
  Administered 2013-03-15: 15:00:00 via INTRAVENOUS

## 2013-03-15 NOTE — Telephone Encounter (Signed)
GV AND PRITNED APPT SCHED AND AVS FOR PT FOR nov.Marland KitchenMarland KitchenAND dec.Marland KitchenMarland KitchenSED ADDED TX.

## 2013-03-15 NOTE — Progress Notes (Signed)
1345-Pain to abdomen is "better now" per patient.  Pt denies any need for further pain medications.  1525-Pt c/o "sharp, stabbing pain to abdomen."  Dr. Truett Perna notified and order received for Morphine 4mg . IV now.

## 2013-03-15 NOTE — Progress Notes (Signed)
   [facility]    OFFICE PROGRESS NOTE   INTERVAL HISTORY:   He returns for a scheduled visit. He completed a cycle of FOLFOX on 03/02/2013. No report of nausea/vomiting or neuropathy symptoms. His chief complaint is abdominal pain. The pain is not relieved with the current pain medications. I discussed his symptoms via an interpreter and his son-in-law. The family reports he has been intermittently confused. He continues to feedings and is ambulatory in the home.  Objective:  Vital signs in last 24 hours:  Blood pressure 135/74, pulse 114, temperature 97.3 F (36.3 C), temperature source Oral, resp. rate 19, height 5\' 2"  (1.575 m), weight 114 lb 12.8 oz (52.073 kg), SpO2 98.00%.    HEENT: No thrush or ulcers Resp: Distant breath sounds, lungs are clear, no respiratory distress Cardio: Regular rate and rhythm GI: The liver is palpable in the right abdomen with associated tenderness. Left upper quadrant gastrostomy tube site with mild induration at the skin exit. Vascular: No leg edema. Decreased skin turgor at the arms. Neuro: Alert, follows commands, he can ambulate with assistance    Portacath/PICC-without erythema  Lab Results:  Lab Results  Component Value Date   WBC 10.0 03/15/2013   HGB 6.9* 03/15/2013   HCT 21.9* 03/15/2013   MCV 74.1* 03/15/2013   PLT 177 03/15/2013   ANC 7.3  BUN 16.3, creatinine 1.1, calcium 12.8, albumin 2.4  Medications: I have reviewed the patient's current medications.  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  -PET scan 12/01/2012 confirmed a hypermetabolic distal esophagus mass, hypermetabolic right paratracheal lymph node, hypermetabolic right liver lesion, and hypermetabolic upper abdominal lymphadenopathy.  -completed palliative radiation 12/25/2012.  -Status post cycle 1 Taxol/carboplatin 02/09/2013.  -Status post cycle 1  FOLFOX 03/02/2013 2. Dysphagia secondary to #1, status post placement of a gastrostomy tube in radiology on 11/17/2012, he continues tube feedings.  3. Odynophagia/upper abdominal pain secondary to the esophagus mass, radiation esophagitis. The abdominal pain is most likely secondary to liver metastases/upper abdominal adenopathy.  4. Hypercalcemia of malignancy-status post Zometa 01/14/2013, 02/04/2013 and 02/11/2013. Status post calcitonin, IV fluids and Xgeva on 02/18/2013. Status post calcitonin and IV fluids on 02/19/2013. Status post IV fluids on 02/20/2013. Persistent, but partially improved. 5. Dyspnea-etiology unclear. Chest x-ray 12/30/2012 showed a small unilateral pleural effusion favored to be right-sided; right hemidiaphragm elevation with adjacent volume loss/atelectasis; right paratracheal soft tissue fullness. He did not complain of dyspnea today.  6. Anemia secondary to chemotherapy/radiation, malnutrition, and? GI bleeding  Disposition:  He has completed one cycle of FOLFOX chemotherapy. He appears to have tolerated the chemotherapy well, but he continues to have abdominal pain. We increased the Duragesic patch and Roxanol.  There is persistent hypercalcemia, though there has been some improvement. He will receive intravenous fluids and another dose of Zometa today.  His overall performance status remains borderline to continue chemotherapy. I discussed this with his family via an interpreter today. I will recommend hospice care if the pain and his performance status do not improve over the next few weeks.  He will receive a Red cell transfusion this week.   Thornton Papas, MD  03/15/2013  10:34 AM

## 2013-03-15 NOTE — Patient Instructions (Addendum)
[facility] Discharge Instructions for Patients Receiving Chemotherapy  Today you received the following chemotherapy agents Folfox  To help prevent nausea and vomiting after your treatment, we encourage you to take your nausea medication as prescribed.   If you develop nausea and vomiting that is not controlled by your nausea medication, call the clinic.   BELOW ARE SYMPTOMS THAT SHOULD BE REPORTED IMMEDIATELY:  *FEVER GREATER THAN 100.5 F  *CHILLS WITH OR WITHOUT FEVER  NAUSEA AND VOMITING THAT IS NOT CONTROLLED WITH YOUR NAUSEA MEDICATION  *UNUSUAL SHORTNESS OF BREATH  *UNUSUAL BRUISING OR BLEEDING  TENDERNESS IN MOUTH AND THROAT WITH OR WITHOUT PRESENCE OF ULCERS  *URINARY PROBLEMS  *BOWEL PROBLEMS  UNUSUAL RASH Items with * indicate a potential emergency and should be followed up as soon as possible.  Feel free to call the clinic you have any questions or concerns. The clinic phone number is [phone]. PLEASE NOTE MEDICATION CHANGES: 1. Increase pain patch to 50 mcg every 3 days (see script) 2. May increase morphine to 10 mls every 4 hours as needed for pain (see script). PLEASE BRING ALL MEDS TO YOUR NEXT DOCTOR APPOINTMENT- would be best if your caregiver could also come to the appointment.    Zoledronic Acid injection (Hypercalcemia, Oncology) What is this medicine? ZOLEDRONIC ACID (ZOE le dron ik AS id) lowers the amount of calcium loss from bone. It is used to treat too much calcium in your blood from cancer. It is also used to prevent complications of cancer that has spread to the bone. This medicine may be used for other purposes; ask your health care provider or pharmacist if you have questions. COMMON BRAND NAME(S): Zometa What should I tell my health care provider before I take this medicine? They need to know if you have any of these conditions: -aspirin-sensitive asthma -cancer, especially if you are receiving medicines used to treat  cancer -dental disease or wear dentures -infection -kidney disease -receiving corticosteroids like dexamethasone or prednisone -an unusual or allergic reaction to zoledronic acid, other medicines, foods, dyes, or preservatives -pregnant or trying to get pregnant -breast-feeding How should I use this medicine? This medicine is for infusion into a vein. It is given by a health care professional in a hospital or clinic setting. Talk to your pediatrician regarding the use of this medicine in children. Special care may be needed. Overdosage: If you think you have taken too much of this medicine contact a poison control center or emergency room at once. NOTE: This medicine is only for you. Do not share this medicine with others. What if I miss a dose? It is important not to miss your dose. Call your doctor or health care professional if you are unable to keep an appointment. What may interact with this medicine? -certain antibiotics given by injection -NSAIDs, medicines for pain and inflammation, like ibuprofen or naproxen -some diuretics like bumetanide, furosemide -teriparatide -thalidomide This list may not describe all possible interactions. Give your health care provider a list of all the medicines, herbs, non-prescription drugs, or dietary supplements you use. Also tell them if you smoke, drink alcohol, or use illegal drugs. Some items may interact with your medicine. What should I watch for while using this medicine? Visit your doctor or health care professional for regular checkups. It may be some time before you see the benefit from this medicine. Do not stop taking your medicine unless your doctor tells you to. Your doctor may order blood tests or other  tests to see how you are doing. Women should inform their doctor if they wish to become pregnant or think they might be pregnant. There is a potential for serious side effects to an unborn child. Talk to your health care professional or  pharmacist for more information. You should make sure that you get enough calcium and vitamin D while you are taking this medicine. Discuss the foods you eat and the vitamins you take with your health care professional. Some people who take this medicine have severe bone, joint, and/or muscle pain. This medicine may also increase your risk for jaw problems or a broken thigh bone. Tell your doctor right away if you have severe pain in your jaw, bones, joints, or muscles. Tell your doctor if you have any pain that does not go away or that gets worse. Tell your dentist and dental surgeon that you are taking this medicine. You should not have major dental surgery while on this medicine. See your dentist to have a dental exam and fix any dental problems before starting this medicine. Take good care of your teeth while on this medicine. Make sure you see your dentist for regular follow-up appointments. What side effects may I notice from receiving this medicine? Side effects that you should report to your doctor or health care professional as soon as possible: -allergic reactions like skin rash, itching or hives, swelling of the face, lips, or tongue -anxiety, confusion, or depression -breathing problems -changes in vision -eye pain -feeling faint or lightheaded, falls -jaw pain, especially after dental work -mouth sores -muscle cramps, stiffness, or weakness -trouble passing urine or change in the amount of urine Side effects that usually do not require medical attention (report to your doctor or health care professional if they continue or are bothersome): -bone, joint, or muscle pain -constipation -diarrhea -fever -hair loss -irritation at site where injected -loss of appetite -nausea, vomiting -stomach upset -trouble sleeping -trouble swallowing -weak or tired This list may not describe all possible side effects. Call your doctor for medical advice about side effects. You may report side  effects to FDA at 1-800-FDA-1088. Where should I keep my medicine? This drug is given in a hospital or clinic and will not be stored at home. NOTE: This sheet is a summary. It may not cover all possible information. If you have questions about this medicine, talk to your doctor, pharmacist, or health care provider.  2014, [employer]. (2012-10-01 13:03:13) Blood Transfusion  A blood transfusion replaces your blood or some of its parts. Blood is replaced when you have lost blood because of surgery, an accident, or for severe blood conditions like anemia. You can donate blood to be used on yourself if you have a planned surgery. If you lose blood during that surgery, your own blood can be given back to you. Any blood given to you is checked to make sure it matches your blood type. Your temperature, blood pressure, and heart rate (vital signs) will be checked often.  GET HELP RIGHT AWAY IF:   You feel sick to your stomach (nauseous) or throw up (vomit).  You have watery poop (diarrhea).  You have shortness of breath or trouble breathing.  You have blood in your pee (urine) or have dark colored pee.  You have chest pain or tightness.  Your eyes or skin turn yellow (jaundice).  You have a temperature by mouth above 102 F (38.9 C), not controlled by medicine.  You start to shake and have chills.  You develop a a red rash (hives) or feel itchy.  You develop lightheadedness or feel confused.  You develop back, joint, or muscle pain.  You do not feel hungry (lost appetite).  You feel tired, restless, or nervous.  You develop belly (abdominal) cramps. Document Released: 07/19/2008 Document Revised: 07/15/2011 Document Reviewed: 07/19/2008 [facility] Patient Information 2014 [place], [place].

## 2013-03-16 ENCOUNTER — Telehealth: Payer: Self-pay | Admitting: *Deleted

## 2013-03-16 NOTE — Telephone Encounter (Signed)
I have called and left message for top come tomorrow at 130 not 4pm

## 2013-03-17 ENCOUNTER — Ambulatory Visit: Payer: Self-pay

## 2013-03-17 ENCOUNTER — Ambulatory Visit (HOSPITAL_BASED_OUTPATIENT_CLINIC_OR_DEPARTMENT_OTHER): Payer: Self-pay

## 2013-03-17 VITALS — BP 165/93 | HR 121 | Temp 98.2°F | Resp 24

## 2013-03-17 DIAGNOSIS — C155 Malignant neoplasm of lower third of esophagus: Secondary | ICD-10-CM

## 2013-03-17 DIAGNOSIS — D6481 Anemia due to antineoplastic chemotherapy: Secondary | ICD-10-CM

## 2013-03-17 DIAGNOSIS — D649 Anemia, unspecified: Secondary | ICD-10-CM

## 2013-03-17 MED ORDER — SODIUM CHLORIDE 0.9 % IJ SOLN
10.0000 mL | INTRAMUSCULAR | Status: AC | PRN
  Administered 2013-03-17: 10 mL
  Filled 2013-03-17: qty 10

## 2013-03-17 MED ORDER — SODIUM CHLORIDE 0.9 % IV SOLN
INTRAVENOUS | Status: DC
  Administered 2013-03-17: 14:00:00 via INTRAVENOUS

## 2013-03-17 MED ORDER — HEPARIN SOD (PORK) LOCK FLUSH 100 UNIT/ML IV SOLN
500.0000 [IU] | Freq: Every day | INTRAVENOUS | Status: AC | PRN
  Administered 2013-03-17: 500 [IU]
  Filled 2013-03-17: qty 5

## 2013-03-17 MED ORDER — SODIUM CHLORIDE 0.9 % IJ SOLN
10.0000 mL | INTRAMUSCULAR | Status: DC | PRN
  Administered 2013-03-17: 10 mL
  Filled 2013-03-17: qty 10

## 2013-03-17 MED ORDER — HEPARIN SOD (PORK) LOCK FLUSH 100 UNIT/ML IV SOLN
500.0000 [IU] | Freq: Once | INTRAVENOUS | Status: AC | PRN
  Administered 2013-03-17: 500 [IU]
  Filled 2013-03-17: qty 5

## 2013-03-17 MED ORDER — SODIUM CHLORIDE 0.9 % IV SOLN
250.0000 mL | Freq: Once | INTRAVENOUS | Status: AC
  Administered 2013-03-17: 250 mL via INTRAVENOUS

## 2013-03-17 NOTE — Patient Instructions (Signed)
Blood Transfusion  A blood transfusion replaces your blood or some of its parts. Blood is replaced when you have lost blood because of surgery, an accident, or for severe blood conditions like anemia. You can donate blood to be used on yourself if you have a planned surgery. If you lose blood during that surgery, your own blood can be given back to you. Any blood given to you is checked to make sure it matches your blood type. Your temperature, blood pressure, and heart rate (vital signs) will be checked often.  GET HELP RIGHT AWAY IF:   You feel sick to your stomach (nauseous) or throw up (vomit).  You have watery poop (diarrhea).  You have shortness of breath or trouble breathing.  You have blood in your pee (urine) or have dark colored pee.  You have chest pain or tightness.  Your eyes or skin turn yellow (jaundice).  You have a temperature by mouth above 102 F (38.9 C), not controlled by medicine.  You start to shake and have chills.  You develop a a red rash (hives) or feel itchy.  You develop lightheadedness or feel confused.  You develop back, joint, or muscle pain.  You do not feel hungry (lost appetite).  You feel tired, restless, or nervous.  You develop belly (abdominal) cramps. Document Released: 07/19/2008 Document Revised: 07/15/2011 Document Reviewed: 07/19/2008 [facility] Patient Information 2014 [place], [place].   Pt also received 1 liter of normal saline

## 2013-03-17 NOTE — Patient Instructions (Signed)

## 2013-03-18 LAB — TYPE AND SCREEN
Antibody Screen: NEGATIVE
Unit division: 0

## 2013-03-23 ENCOUNTER — Ambulatory Visit (HOSPITAL_BASED_OUTPATIENT_CLINIC_OR_DEPARTMENT_OTHER): Payer: Self-pay | Admitting: Nurse Practitioner

## 2013-03-23 ENCOUNTER — Other Ambulatory Visit (HOSPITAL_BASED_OUTPATIENT_CLINIC_OR_DEPARTMENT_OTHER): Payer: Self-pay | Admitting: Lab

## 2013-03-23 ENCOUNTER — Telehealth: Payer: Self-pay | Admitting: Oncology

## 2013-03-23 ENCOUNTER — Ambulatory Visit (HOSPITAL_BASED_OUTPATIENT_CLINIC_OR_DEPARTMENT_OTHER): Payer: Self-pay

## 2013-03-23 VITALS — BP 159/89 | HR 124 | Temp 96.2°F | Wt 116.3 lb

## 2013-03-23 VITALS — BP 170/63 | HR 110 | Temp 98.4°F | Resp 20

## 2013-03-23 DIAGNOSIS — C155 Malignant neoplasm of lower third of esophagus: Secondary | ICD-10-CM

## 2013-03-23 DIAGNOSIS — C159 Malignant neoplasm of esophagus, unspecified: Secondary | ICD-10-CM

## 2013-03-23 DIAGNOSIS — D649 Anemia, unspecified: Secondary | ICD-10-CM

## 2013-03-23 DIAGNOSIS — Z5189 Encounter for other specified aftercare: Secondary | ICD-10-CM

## 2013-03-23 DIAGNOSIS — D6481 Anemia due to antineoplastic chemotherapy: Secondary | ICD-10-CM

## 2013-03-23 DIAGNOSIS — C787 Secondary malignant neoplasm of liver and intrahepatic bile duct: Secondary | ICD-10-CM

## 2013-03-23 DIAGNOSIS — R0609 Other forms of dyspnea: Secondary | ICD-10-CM

## 2013-03-23 LAB — COMPREHENSIVE METABOLIC PANEL (CC13)
ALT: 13 U/L (ref 0–55)
Anion Gap: 9 mEq/L (ref 3–11)
CO2: 19 mEq/L — ABNORMAL LOW (ref 22–29)
Calcium: 12.2 mg/dL — ABNORMAL HIGH (ref 8.4–10.4)
Chloride: 102 mEq/L (ref 98–109)
Creatinine: 1 mg/dL (ref 0.7–1.3)
Sodium: 130 mEq/L — ABNORMAL LOW (ref 136–145)
Total Protein: 7.9 g/dL (ref 6.4–8.3)

## 2013-03-23 MED ORDER — HEPARIN SOD (PORK) LOCK FLUSH 100 UNIT/ML IV SOLN
500.0000 [IU] | Freq: Once | INTRAVENOUS | Status: AC
  Administered 2013-03-23: 500 [IU] via INTRAVENOUS
  Filled 2013-03-23: qty 5

## 2013-03-23 MED ORDER — SODIUM CHLORIDE 0.9 % IV SOLN
INTRAVENOUS | Status: DC
  Administered 2013-03-23: 1000 mL via INTRAVENOUS

## 2013-03-23 MED ORDER — SODIUM CHLORIDE 0.9 % IJ SOLN
10.0000 mL | INTRAMUSCULAR | Status: DC | PRN
  Administered 2013-03-23: 10 mL via INTRAVENOUS
  Filled 2013-03-23: qty 10

## 2013-03-23 NOTE — Progress Notes (Addendum)
OFFICE PROGRESS NOTE  Interval history:  Douglas Jordan returns for followup of metastatic esophagus cancer. He completed cycle 2 FOLFOX 03/15/2013.  He is accompanied to today's visit by an interpreter, his daughter and son-in-law. They report he is weak and more confused. He sleeps a lot. He is less interactive. He does not complain of pain. He continues tube feedings.   Objective: Blood pressure 159/89, pulse 124, temperature 96.2 F (35.7 C), temperature source Axillary, weight 116 lb 4.8 oz (52.753 kg), SpO2 100.00%.  White coating over tongue. Lungs clear. Regular cardiac rhythm. Liver palpable right abdomen with associated tenderness. Abdomen appears distended. Left upper quadrant gastrostomy tube. No leg edema. Moves all extremities. He is awake. Limited verbal response. Decreased skin turgor.  Lab Results: Lab Results  Component Value Date   WBC 10.0 03/15/2013   HGB 6.9* 03/15/2013   HCT 21.9* 03/15/2013   MCV 74.1* 03/15/2013   PLT 177 03/15/2013    Chemistry:    Chemistry      Component Value Date/Time   NA 130* 03/15/2013 0918   NA 137 11/21/2012 0532   K 4.1 03/15/2013 0918   K 4.6 11/21/2012 0532   CL 102 11/21/2012 0532   CO2 17* 03/15/2013 0918   CO2 31 11/21/2012 0532   BUN 16.3 03/15/2013 0918   BUN 13 11/21/2012 0532   CREATININE 1.1 03/15/2013 0918   CREATININE 1.00 11/21/2012 0532      Component Value Date/Time   CALCIUM 12.8* 03/15/2013 0918   CALCIUM 9.8 11/21/2012 0532   ALKPHOS 176* 03/15/2013 0918   AST 18 03/15/2013 0918   ALT 13 03/15/2013 0918   BILITOT 0.64 03/15/2013 0918       Studies/Results: No results found.  Medications: I have reviewed the patient's current medications.  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  -PET scan 12/01/2012 confirmed a hypermetabolic distal esophagus mass, hypermetabolic right paratracheal lymph node, hypermetabolic  right liver lesion, and hypermetabolic upper abdominal lymphadenopathy.  -completed palliative radiation 12/25/2012.  -Status post cycle 1 Taxol/carboplatin 02/09/2013.  -Status post cycle 1 FOLFOX 03/02/2013. -Status post cycle 2 FOLFOX 03/15/2013.  2. Dysphagia secondary to #1, status post placement of a gastrostomy tube in radiology on 11/17/2012, he continues tube feedings.  3. Odynophagia/upper abdominal pain secondary to the esophagus mass, radiation esophagitis. The abdominal pain is most likely secondary to liver metastases/upper abdominal adenopathy.  4. Hypercalcemia of malignancy-status post Zometa 01/14/2013, 02/04/2013 and 02/11/2013. Status post calcitonin, IV fluids and Xgeva on 02/18/2013. Status post calcitonin and IV fluids on 02/19/2013. Status post IV fluids on 02/20/2013. Improved on 03/15/2013. 5. Dyspnea-etiology unclear. Chest x-ray 12/30/2012 showed a small unilateral pleural effusion favored to be right-sided; right hemidiaphragm elevation with adjacent volume loss/atelectasis; right paratracheal soft tissue fullness. He did not complain of dyspnea today.  6. Anemia secondary to chemotherapy/radiation, malnutrition, and? GI bleeding. He was transfused packed red cells on 03/17/2013.  Disposition-he has completed 2 cycles of FOLFOX chemotherapy. His performance status continues to decline. Dr. Truett Perna discussed with his family that the overall decline in his condition is related to progression of the cancer despite chemotherapy and the weakness and confusion are likely related to refractory hypercalcemia.  Dr. Truett Perna recommends discontinuation of chemotherapy and changing the focus to supportive/comfort care. His family is agreeable to a hospice referral. [name] daughter would like to speak with her mother and siblings regarding CODE STATUS issues. We will ask the  hospice nurse to address CODE STATUS.  We discussed [facility] as a possible option should his care be too  much for them to manage at home.  We scheduled a followup visit in approximately 3 weeks. His family will contact the office in the interim with any problems.  Patient seen with Dr. Truett Perna.   Lonna Cobb ANP/GNP-BC   This was a shared visit with Lonna Cobb.  His performance status continues to decline. There has been no clinical improvement with chemotherapy. He has persistent hypercalcemia. I discussed the prognosis and treatment options with his family via an interpreter. [name] is confused and lethargic. I recommend comfort care. The daughter agrees. We will make a [facility] hospice referral. He will likely benefit from placement at [facility].  The lethargy may be in part related to narcotics. We will ask the family to discontinue the Duragesic patch.  Mancel Bale, M.D.   .

## 2013-03-23 NOTE — Patient Instructions (Signed)
Dehydration, Adult Dehydration is when you lose more fluids from the body than you take in. Vital organs like the kidneys, brain, and heart cannot function without a proper amount of fluids and salt. Any loss of fluids from the body can cause dehydration.  CAUSES   Vomiting.  Diarrhea.  Excessive sweating.  Excessive urine output.  Fever. SYMPTOMS  Mild dehydration  Thirst.  Dry lips.  Slightly dry mouth. Moderate dehydration  Very dry mouth.  Sunken eyes.  Skin does not bounce back quickly when lightly pinched and released.  Dark urine and decreased urine production.  Decreased tear production.  Headache. Severe dehydration  Very dry mouth.  Extreme thirst.  Rapid, weak pulse (more than 100 beats per minute at rest).  Cold hands and feet.  Not able to sweat in spite of heat and temperature.  Rapid breathing.  Blue lips.  Confusion and lethargy.  Difficulty being awakened.  Minimal urine production.  No tears. DIAGNOSIS  Your caregiver will diagnose dehydration based on your symptoms and your exam. Blood and urine tests will help confirm the diagnosis. The diagnostic evaluation should also identify the cause of dehydration. TREATMENT  Treatment of mild or moderate dehydration can often be done at home by increasing the amount of fluids that you drink. It is best to drink small amounts of fluid more often. Drinking too much at one time can make vomiting worse. Refer to the home care instructions below. Severe dehydration needs to be treated at the hospital where you will probably be given intravenous (IV) fluids that contain water and electrolytes. HOME CARE INSTRUCTIONS   Ask your caregiver about specific rehydration instructions.  Drink enough fluids to keep your urine clear or pale yellow.  Drink small amounts frequently if you have nausea and vomiting.  Eat as you normally do.  Avoid:  Foods or drinks high in sugar.  Carbonated  drinks.  Juice.  Extremely hot or cold fluids.  Drinks with caffeine.  Fatty, greasy foods.  Alcohol.  Tobacco.  Overeating.  Gelatin desserts.  Wash your hands well to avoid spreading bacteria and viruses.  Only take over-the-counter or prescription medicines for pain, discomfort, or fever as directed by your caregiver.  Ask your caregiver if you should continue all prescribed and over-the-counter medicines.  Keep all follow-up appointments with your caregiver. SEEK MEDICAL CARE IF:  You have abdominal pain and it increases or stays in one area (localizes).  You have a rash, stiff neck, or severe headache.  You are irritable, sleepy, or difficult to awaken.  You are weak, dizzy, or extremely thirsty. SEEK IMMEDIATE MEDICAL CARE IF:   You are unable to keep fluids down or you get worse despite treatment.  You have frequent episodes of vomiting or diarrhea.  You have blood or green matter (bile) in your vomit.  You have blood in your stool or your stool looks black and tarry.  You have not urinated in 6 to 8 hours, or you have only urinated a small amount of very dark urine.  You have a fever.  You faint. MAKE SURE YOU:   Understand these instructions.  Will watch your condition.  Will get help right away if you are not doing well or get worse. Document Released: 04/22/2005 Document Revised: 07/15/2011 Document Reviewed: 12/10/2010 [employer] Patient Information 2014 [employer].  

## 2013-03-23 NOTE — Telephone Encounter (Signed)
Shameka printed AVS and calendar appt for pt, cancelled chemo and notifed Marcelino Duster, alson notifed Zenovia Jarred for nutrition cancellation

## 2013-03-23 NOTE — Telephone Encounter (Signed)
Faxed pt medical records to Hospice °

## 2013-03-24 ENCOUNTER — Telehealth: Payer: Self-pay | Admitting: *Deleted

## 2013-03-24 NOTE — Telephone Encounter (Signed)
Spoke with patient's daughter Douglas Jordan) and instructed her to take the Duragesic patch off of patient to see if confusion gets any better.  Also, instructed daughter Douglas Jordan) to call us Friday to let us know if confusion gets better.  Per Ebbie Ridge. Thomas.  Patient's daughter verbalized understanding.

## 2013-03-29 ENCOUNTER — Inpatient Hospital Stay: Payer: Self-pay

## 2013-03-29 ENCOUNTER — Telehealth: Payer: Self-pay | Admitting: *Deleted

## 2013-03-29 ENCOUNTER — Encounter: Payer: Self-pay | Admitting: Nutrition

## 2013-03-29 DIAGNOSIS — C155 Malignant neoplasm of lower third of esophagus: Secondary | ICD-10-CM

## 2013-03-29 MED ORDER — METOCLOPRAMIDE HCL 5 MG/5ML PO SOLN: ORAL | Status: DC

## 2013-03-29 NOTE — Telephone Encounter (Signed)
Confirmed that he was admitted to Hospice on 03/24/13. Iris Pert will be seeing him on 03/30/13 at 3:30pm when son [name] is present (speaks English well). He reports he has been doing OK without the Duragesic patch. Still has 150 ml of his morphine on hand as well as the protonix. Is out of the reglan, but feels he will be OK until the Hospice RN sees him tomorrow and organizes everything. He is still taking the tube feedings according to son.  Toniann Fail will call office with update tomorrow.

## 2013-04-01 ENCOUNTER — Encounter (HOSPITAL_COMMUNITY): Payer: Self-pay | Admitting: Emergency Medicine

## 2013-04-01 ENCOUNTER — Inpatient Hospital Stay (HOSPITAL_COMMUNITY)
Admission: AD | Admit: 2013-04-01 | Discharge: 2013-04-02 | DRG: 374 | Disposition: A | Discharge status: Hospice | Payer: Self-pay | Attending: Internal Medicine | Admitting: Internal Medicine

## 2013-04-01 ENCOUNTER — Emergency Department (HOSPITAL_COMMUNITY): Payer: Self-pay

## 2013-04-01 DIAGNOSIS — T451X5A Adverse effect of antineoplastic and immunosuppressive drugs, initial encounter: Secondary | ICD-10-CM | POA: Diagnosis present

## 2013-04-01 DIAGNOSIS — Z9221 Personal history of antineoplastic chemotherapy: Secondary | ICD-10-CM

## 2013-04-01 DIAGNOSIS — K219 Gastro-esophageal reflux disease without esophagitis: Secondary | ICD-10-CM | POA: Diagnosis present

## 2013-04-01 DIAGNOSIS — Z931 Gastrostomy status: Secondary | ICD-10-CM

## 2013-04-01 DIAGNOSIS — D696 Thrombocytopenia, unspecified: Secondary | ICD-10-CM | POA: Diagnosis present

## 2013-04-01 DIAGNOSIS — Z515 Encounter for palliative care: Secondary | ICD-10-CM

## 2013-04-01 DIAGNOSIS — Z66 Do not resuscitate: Secondary | ICD-10-CM | POA: Diagnosis present

## 2013-04-01 DIAGNOSIS — Z923 Personal history of irradiation: Secondary | ICD-10-CM

## 2013-04-01 DIAGNOSIS — Z79899 Other long term (current) drug therapy: Secondary | ICD-10-CM

## 2013-04-01 DIAGNOSIS — E871 Hypo-osmolality and hyponatremia: Secondary | ICD-10-CM

## 2013-04-01 DIAGNOSIS — E236 Other disorders of pituitary gland: Secondary | ICD-10-CM | POA: Diagnosis present

## 2013-04-01 DIAGNOSIS — E876 Hypokalemia: Secondary | ICD-10-CM | POA: Diagnosis present

## 2013-04-01 DIAGNOSIS — E43 Unspecified severe protein-calorie malnutrition: Secondary | ICD-10-CM | POA: Diagnosis present

## 2013-04-01 DIAGNOSIS — IMO0002 Reserved for concepts with insufficient information to code with codable children: Secondary | ICD-10-CM | POA: Diagnosis present

## 2013-04-01 DIAGNOSIS — D6481 Anemia due to antineoplastic chemotherapy: Secondary | ICD-10-CM | POA: Diagnosis present

## 2013-04-01 DIAGNOSIS — C155 Malignant neoplasm of lower third of esophagus: Principal | ICD-10-CM | POA: Diagnosis present

## 2013-04-01 DIAGNOSIS — C787 Secondary malignant neoplasm of liver and intrahepatic bile duct: Secondary | ICD-10-CM | POA: Diagnosis present

## 2013-04-01 DIAGNOSIS — R4182 Altered mental status, unspecified: Secondary | ICD-10-CM | POA: Diagnosis present

## 2013-04-01 DIAGNOSIS — D6959 Other secondary thrombocytopenia: Secondary | ICD-10-CM | POA: Diagnosis present

## 2013-04-01 DIAGNOSIS — R627 Adult failure to thrive: Secondary | ICD-10-CM | POA: Diagnosis present

## 2013-04-01 DIAGNOSIS — C159 Malignant neoplasm of esophagus, unspecified: Secondary | ICD-10-CM | POA: Diagnosis present

## 2013-04-01 LAB — COMPREHENSIVE METABOLIC PANEL
ALT: 15 U/L (ref 0–53)
Albumin: 2.2 g/dL — ABNORMAL LOW (ref 3.5–5.2)
Alkaline Phosphatase: 227 U/L — ABNORMAL HIGH (ref 39–117)
BUN: 25 mg/dL — ABNORMAL HIGH (ref 6–23)
Calcium: 12.9 mg/dL — ABNORMAL HIGH (ref 8.4–10.5)
Chloride: 93 mEq/L — ABNORMAL LOW (ref 96–112)
Potassium: 2.8 mEq/L — ABNORMAL LOW (ref 3.5–5.1)
Sodium: 125 mEq/L — ABNORMAL LOW (ref 135–145)
Total Protein: 7.7 g/dL (ref 6.0–8.3)

## 2013-04-01 LAB — URINALYSIS, ROUTINE W REFLEX MICROSCOPIC
Bilirubin Urine: NEGATIVE
Glucose, UA: NEGATIVE mg/dL
Ketones, ur: NEGATIVE mg/dL
Leukocytes, UA: NEGATIVE
pH: 7 (ref 5.0–8.0)

## 2013-04-01 LAB — CBC
HCT: 25.5 % — ABNORMAL LOW (ref 39.0–52.0)
Hemoglobin: 8.4 g/dL — ABNORMAL LOW (ref 13.0–17.0)
MCH: 25.5 pg — ABNORMAL LOW (ref 26.0–34.0)
MCHC: 32.9 g/dL (ref 30.0–36.0)
RDW: 21.2 % — ABNORMAL HIGH (ref 11.5–15.5)
WBC: 9.1 10*3/uL (ref 4.0–10.5)

## 2013-04-01 LAB — URINE MICROSCOPIC-ADD ON

## 2013-04-01 MED ORDER — LIDOCAINE-PRILOCAINE 2.5-2.5 % EX CREA
TOPICAL_CREAM | Freq: Once | CUTANEOUS | Status: AC
  Administered 2013-04-01: 14:00:00 via TOPICAL
  Filled 2013-04-01: qty 5

## 2013-04-01 MED ORDER — ONDANSETRON HCL 4 MG/2ML IJ SOLN: 4.0000 mg | Freq: Four times a day (QID) | INTRAMUSCULAR | Status: DC | PRN

## 2013-04-01 MED ORDER — LEVOFLOXACIN 500 MG PO TABS
750.0000 mg | ORAL_TABLET | Freq: Once | ORAL | Status: AC
  Administered 2013-04-01: 750 mg via ORAL
  Filled 2013-04-01: qty 2

## 2013-04-01 MED ORDER — SODIUM CHLORIDE 0.9 % IV SOLN
INTRAVENOUS | Status: DC
  Administered 2013-04-01 – 2013-04-02 (×3): via INTRAVENOUS

## 2013-04-01 MED ORDER — METOCLOPRAMIDE HCL 5 MG/ML IJ SOLN
5.0000 mg | Freq: Three times a day (TID) | INTRAMUSCULAR | Status: DC
  Administered 2013-04-01 – 2013-04-02 (×3): 5 mg via INTRAVENOUS
  Filled 2013-04-01 (×2): qty 1
  Filled 2013-04-01: qty 2
  Filled 2013-04-01: qty 1
  Filled 2013-04-01 (×2): qty 2

## 2013-04-01 MED ORDER — PANTOPRAZOLE SODIUM 40 MG PO PACK
40.0000 mg | PACK | Freq: Every day | ORAL | Status: DC
  Filled 2013-04-01: qty 20

## 2013-04-01 MED ORDER — HEPARIN SODIUM (PORCINE) 5000 UNIT/ML IJ SOLN
5000.0000 [IU] | Freq: Three times a day (TID) | INTRAMUSCULAR | Status: DC
  Filled 2013-04-01 (×4): qty 1

## 2013-04-01 MED ORDER — FREE WATER
200.0000 mL | Freq: Every day | Status: DC
  Administered 2013-04-01: 200 mL

## 2013-04-01 MED ORDER — POLYETHYLENE GLYCOL 3350 17 G PO PACK
17.0000 g | PACK | Freq: Every day | ORAL | Status: DC
  Filled 2013-04-01 (×2): qty 1

## 2013-04-01 MED ORDER — PANTOPRAZOLE SODIUM 40 MG IV SOLR
40.0000 mg | Freq: Every day | INTRAVENOUS | Status: DC
  Administered 2013-04-01: 40 mg via INTRAVENOUS
  Filled 2013-04-01 (×2): qty 40

## 2013-04-01 MED ORDER — POTASSIUM CHLORIDE 10 MEQ/100ML IV SOLN: 10.0000 meq | Freq: Once | INTRAVENOUS | Status: DC

## 2013-04-01 MED ORDER — DOCUSATE SODIUM 50 MG/5ML PO LIQD
100.0000 mg | Freq: Every day | ORAL | Status: DC
  Filled 2013-04-01 (×2): qty 10

## 2013-04-01 MED ORDER — METOCLOPRAMIDE HCL 5 MG/5ML PO SOLN
5.0000 mg | Freq: Three times a day (TID) | ORAL | Status: DC
  Filled 2013-04-01 (×5): qty 5

## 2013-04-01 MED ORDER — BISACODYL 10 MG RE SUPP: 10.0000 mg | Freq: Every day | RECTAL | Status: DC | PRN

## 2013-04-01 MED ORDER — POTASSIUM CHLORIDE 10 MEQ/100ML IV SOLN
10.0000 meq | INTRAVENOUS | Status: AC
  Administered 2013-04-01 (×3): 10 meq via INTRAVENOUS
  Filled 2013-04-01 (×3): qty 100

## 2013-04-01 MED ORDER — MORPHINE SULFATE 2 MG/ML IJ SOLN
1.0000 mg | INTRAMUSCULAR | Status: DC | PRN
  Administered 2013-04-01: 2 mg via INTRAVENOUS
  Administered 2013-04-01: 1 mg via INTRAVENOUS
  Administered 2013-04-01 – 2013-04-02 (×4): 2 mg via INTRAVENOUS
  Filled 2013-04-01 (×6): qty 1

## 2013-04-01 MED ORDER — PRO-STAT SUGAR FREE PO LIQD
30.0000 mL | Freq: Two times a day (BID) | ORAL | Status: DC
  Administered 2013-04-01 (×2): 30 mL via ORAL
  Filled 2013-04-01 (×4): qty 30

## 2013-04-01 MED ORDER — MORPHINE SULFATE 10 MG/5ML PO SOLN: 20.0000 mg | Freq: Four times a day (QID) | ORAL | Status: DC | PRN

## 2013-04-01 NOTE — H&P (Signed)
[facility] History and Physical  Douglas Jordan WUJ:[id number] DOB: 1929/06/09 DOA: 04/01/2013  Referring physician: ED PCP: No PCP Per Patient   Chief Complaint: AMS  HPI: Douglas Jordan is a 79 y.o. male who presents to the ED with progressively declining mental status.  This occurs in the context of metastatic esophageal cancer which he is no longer receiving chemo for and is on home hospice for at this point.  Per son, the patient has been non-verbal for the past several weeks, today he was lethargic and non-ambulatory which is a change from yesterday.  Apparently home-hospice did not have the proper DNR/DNI paperwork, and so sent the patient in to the ED.  Review of Systems: Unable to perform due to AMS.  Past Medical History  Diagnosis Date  . GERD (gastroesophageal reflux disease) No knownn hx but, pt reports symptoms of mid sternal pain at xiphoid that is irriated when eating.  . Esophageal cancer 11/13/2012  . Hypercalcemia 01/14/2013  . S/P radiation therapy 12/14/2012-12/25/2012    Esophageal tumor  / 30 Gy in 10 fractions   Past Surgical History  Procedure Laterality Date  . Esophagogastroduodenoscopy (egd) with esophageal dilation N/A 11/13/2012    Procedure: ESOPHAGOGASTRODUODENOSCOPY (EGD) WITH ESOPHAGEAL DILATION;  Surgeon: Rachael Fee, MD;  Location: [facility] ENDOSCOPY;  Service: Endoscopy;  Laterality: N/A;  . Savory dilation N/A 11/13/2012    Procedure: SAVORY DILATION;  Surgeon: Rachael Fee, MD;  Location: [facility] ENDOSCOPY;  Service: Endoscopy;  Laterality: N/A;   Social History:  reports that he has never smoked. He does not have any smokeless tobacco history on file. He reports that he does not drink alcohol or use illicit drugs.   No Known Allergies  History reviewed. No pertinent family history.  Prior to Admission medications   Medication Sig Start Date End Date Taking? Authorizing Provider  Amino Acids-Protein Hydrolys (FEEDING SUPPLEMENT, PRO-STAT SUGAR FREE 64,)  LIQD Take 30 mLs by mouth 2 (two) times daily.   Yes Historical Provider, MD  bisacodyl (DULCOLAX) 10 MG suppository Place 10 mg rectally daily as needed for mild constipation or moderate constipation.   Yes Historical Provider, MD  docusate (COLACE) 50 MG/5ML liquid Take 100 mg by mouth daily.   Yes Historical Provider, MD  lidocaine-prilocaine (EMLA) cream Apply 1 application topically daily as needed. port   Yes Historical Provider, MD  metoCLOPramide (REGLAN) 5 MG/5ML solution Take 5 mg by mouth 4 (four) times daily -  before meals and at bedtime.   Yes Historical Provider, MD  morphine 10 MG/5ML solution Take 20 mg by mouth 4 (four) times daily as needed for severe pain.   Yes Historical Provider, MD  omeprazole (PRILOSEC) 2 mg/mL SUSP Take 20 mg by mouth daily.   Yes Historical Provider, MD  polyethylene glycol (MIRALAX / GLYCOLAX) packet Take 17 g by mouth daily.   Yes Historical Provider, MD  Water For Irrigation, Sterile (FREE WATER) SOLN Place 200 mLs into feeding tube daily. 11/22/12   Ripudeep Jenna Luo, MD   Physical Exam: Filed Vitals:   04/01/13 0415  BP: 121/68  Pulse: 104  Temp:   Resp: 22    General:  NAD, resting comfortably in bed, lethargic, wakes up easily but then falls back asleep Eyes: Opens eyes ENT: mucous membranes moist Neck: supple w/o JVD Cardiovascular: RRR w/o MRG Respiratory: CTA B Abdomen: soft, nt, distended abdomen, bs+ Skin: no rash nor lesion Musculoskeletal: MAE, full ROM all 4 extremities Psychiatric: non-verbal Neurologic: MAE, non-verbal, not  obeying commands,   Labs on Admission:  Basic Metabolic Panel:  Recent Labs Lab 04/01/13 0210  NA 125*  K 2.8*  CL 93*  CO2 20  GLUCOSE 96  BUN 25*  CREATININE 1.01  CALCIUM 12.9*   Liver Function Tests:  Recent Labs Lab 04/01/13 0210  AST 26  ALT 15  ALKPHOS 227*  BILITOT 0.6  PROT 7.7  ALBUMIN 2.2*   No results found for this basename: LIPASE, AMYLASE,  in the last 168 hours No  results found for this basename: AMMONIA,  in the last 168 hours CBC:  Recent Labs Lab 04/01/13 0210  WBC 9.1  HGB 8.4*  HCT 25.5*  MCV 77.3*  PLT 136*   Cardiac Enzymes: No results found for this basename: CKTOTAL, CKMB, CKMBINDEX, TROPONINI,  in the last 168 hours  BNP (last 3 results) No results found for this basename: PROBNP,  in the last 8760 hours CBG: No results found for this basename: GLUCAP,  in the last 168 hours  Radiological Exams on Admission: Dg Chest 2 View  04/01/2013   CLINICAL DATA:  Weakness.  Lethargy.  Esophageal carcinoma.  EXAM: CHEST  2 VIEW  COMPARISON:  12/30/2012  FINDINGS: New bibasilar infiltrates or atelectasis demonstrated. Mild cardiomegaly and ectasia of the thoracic ureter are stable. Right-sided power port remains in appropriate position.  IMPRESSION: New bibasilar infiltrates versus atelectasis.   Electronically Signed   By: Myles Rosenthal M.D.   On: 04/01/2013 02:26    EKG: Independently reviewed.  Assessment/Plan Active Problems:   Altered mental status   1. AMS - In the ED, work up was suspicious for bibasilar infiltrates, but no other SIRS criteria.  He was given a dose of levoquin; however, after discussing with the son both directly and via translator phone, son states that since we cannot reverse the patients cancer, and because the patient has very poor quality of life and essentially no enjoyment of life at this point that we should not treat an infection such as a UTI, PNA, or hepatic encephalopathy at this point.  He does desire [place] to continue tube feeds and meds for the moment.  But agrees with no more testing, and certainly does not want [place] to go looking for a brain bleed / brain met as he is horrified by even the thought of neurosurgery at this point.  Considering the very terminal prognosis of metastatic esophageal cancer, the patients very poor quality of life at this point, the family's wishes are more than reasonable.  Will admit  to obs as the family DOES desire an inpatient hospice facility as the patient approaches his death.    Code Status: DNR/DNI (must indicate code status--if unknown or must be presumed, indicate so) Family Communication: Spoke with son at bedside, both initially in [place] and then via Nurse, learning disability phone in [place] (indicate person spoken with, if applicable, with phone number if by telephone) Disposition Plan: Admit to obs (indicate anticipated LOS)  Time spent: 70 min   Hamza Empson M. [facility] Pager [phone]  If 7PM-7AM, please contact night-coverage [email or web address] Password [email or web address] 04/01/2013, 4:42 AM

## 2013-04-01 NOTE — ED Provider Notes (Signed)
CSN: [id number]     Arrival date & time 04/01/13  0128 History   First MD Initiated Contact with Patient 04/01/13 [phone]     Chief Complaint  Patient presents with  . Weakness   (Consider location/radiation/quality/duration/timing/severity/associated sxs/prior Treatment) HPI History provided by EMS and patient's son bedside. He is on hospice care for end-stage esophageal cancer. Her baseline is ambulatory and interactive. At home tonight around 10 PM family is having difficulty waking him and he was having noisy breathing. EMS was called. Patient is now not acting himself, no longer interactive, is aphasic. Patient's son bedside relates that he would not want any CPR or intubation. Patient does not provide any history. Level V caveat applies Past Medical History  Diagnosis Date  . GERD (gastroesophageal reflux disease) No knownn hx but, pt reports symptoms of mid sternal pain at xiphoid that is irriated when eating.  . Esophageal cancer 11/13/2012  . Hypercalcemia 01/14/2013  . S/P radiation therapy 12/14/2012-12/25/2012    Esophageal tumor  / 30 Gy in 10 fractions   Past Surgical History  Procedure Laterality Date  . Esophagogastroduodenoscopy (egd) with esophageal dilation N/A 11/13/2012    Procedure: ESOPHAGOGASTRODUODENOSCOPY (EGD) WITH ESOPHAGEAL DILATION;  Surgeon: Rachael Fee, MD;  Location: [facility] ENDOSCOPY;  Service: Endoscopy;  Laterality: N/A;  . Savory dilation N/A 11/13/2012    Procedure: SAVORY DILATION;  Surgeon: Rachael Fee, MD;  Location: [facility] ENDOSCOPY;  Service: Endoscopy;  Laterality: N/A;   History reviewed. No pertinent family history. History  Substance Use Topics  . Smoking status: Never Smoker   . Smokeless tobacco: Not on file  . Alcohol Use: No    Review of Systems  Unable to perform ROS  level V caveat as above  Allergies  Review of patient's allergies indicates no known allergies.  Home Medications   Current Outpatient Rx  Name  Route  Sig  Dispense   Refill  . Amino Acids-Protein Hydrolys (FEEDING SUPPLEMENT, PRO-STAT SUGAR FREE 64,) LIQD   Oral   Take 30 mLs by mouth 2 (two) times daily.         . bisacodyl (DULCOLAX) 10 MG suppository   Rectal   Place 10 mg rectally daily as needed for mild constipation or moderate constipation.         . docusate (COLACE) 50 MG/5ML liquid   Oral   Take 100 mg by mouth daily.         Marland Kitchen lidocaine-prilocaine (EMLA) cream   Topical   Apply 1 application topically daily as needed. port         . metoCLOPramide (REGLAN) 5 MG/5ML solution   Oral   Take 5 mg by mouth 4 (four) times daily -  before meals and at bedtime.         Marland Kitchen morphine 10 MG/5ML solution   Oral   Take 20 mg by mouth 4 (four) times daily as needed for severe pain.         Marland Kitchen omeprazole (PRILOSEC) 2 mg/mL SUSP   Oral   Take 20 mg by mouth daily.         . polyethylene glycol (MIRALAX / GLYCOLAX) packet   Oral   Take 17 g by mouth daily.         . Water For Irrigation, Sterile (FREE WATER) SOLN   Per Tube   Place 200 mLs into feeding tube daily.          BP 148/77  Pulse 119  Temp(Src)  98.1 F (36.7 C) (Rectal)  Resp 17  SpO2 96% Physical Exam  Constitutional:  Cachectic  HENT:  Head: Normocephalic and atraumatic.  Dry mucous membranes  Eyes: EOM are normal. Pupils are equal, round, and reactive to light.  Neck: Neck supple.  Cardiovascular: Regular rhythm and intact distal pulses.   Tachycardic  Pulmonary/Chest:  Shallow breath sounds bilaterally. No respiratory distress  Abdominal: Soft. He exhibits no distension. There is no tenderness.  Musculoskeletal:  No apparent deformity, distal pulses intact  Neurological:  Awake with eyes open, does not follow commands, aphasic  Skin: Skin is warm and dry.    ED Course  Procedures (including critical care time) Labs Review Labs Reviewed  CBC - Abnormal; Notable for the following:    RBC 3.30 (*)    Hemoglobin 8.4 (*)    HCT 25.5 (*)     MCV 77.3 (*)    MCH 25.5 (*)    RDW 21.2 (*)    Platelets 136 (*)    All other components within normal limits  COMPREHENSIVE METABOLIC PANEL  URINALYSIS, ROUTINE W REFLEX MICROSCOPIC   Imaging Review Dg Chest 2 View  04/01/2013   CLINICAL DATA:  Weakness.  Lethargy.  Esophageal carcinoma.  EXAM: CHEST  2 VIEW  COMPARISON:  12/30/2012  FINDINGS: New bibasilar infiltrates or atelectasis demonstrated. Mild cardiomegaly and ectasia of the thoracic ureter are stable. Right-sided power port remains in appropriate position.  IMPRESSION: New bibasilar infiltrates versus atelectasis.   Electronically Signed   By: Myles Rosenthal M.D.   On: 04/01/2013 02:26   Family bedside initially requesting antibiotics for pneumonia and on recheck with medicine consultation now declines. No IV fluids and medications. Medical admission to palliative care/ hospice  MDM  Dx: Altered mental status, end-stage esophageal cancer, pneumonia, hypokalemia  Chest x-ray labs reviewed as above MED admit    Sunnie Nielsen, MD 04/01/13 [phone]

## 2013-04-01 NOTE — Progress Notes (Addendum)
Room [facility] 1310 - Cobi Mcree - [facility] RN Visit-R.Shoshanah Dapper RN  NON COVERED admission due to self-pay.  Related [facility] diagnosis of essophageal cancer.   Pt is FULL code with [facility].    Pt alert, language barrier, lying in bed, appears comfortable.    No family present.   Patient's home medication list is on shadow chart.   Pt admitted with worsening mental status, failure to thrive and weakness - unable to get out of bed per family which is a change for pt.    PMT has been consulted for GOC with [place].  Discussed with CM RN Bjorn Loser - asked [place] to talk with Charge RN to set up with translator.  Attempting to schedule for Friday 11/28-am.   Per [facility] notes: Pt receives Osmolite 1.4 qid via peg gravity. He gets 25ml's of free water a day thru PEG.  Please call [facility] @ [phone]- ask for RN Liaison or after hours,ask for on-call RN with any hospice needs.   Thank you.  Joneen Boers, RN  [facility]  Hospice Liaison  [phone])

## 2013-04-01 NOTE — ED Notes (Signed)
Per Ems: the patient has end stage esophogeal cancer. The patient has declined rapidly today. The patient was up walking around yesterday and today he has generalized weakness and failure to thrive

## 2013-04-01 NOTE — ED Notes (Signed)
Bed: OZ36 Expected date:  Expected time:  Means of arrival:  Comments: EMS/esophageal cancer/weak and lethargic

## 2013-04-01 NOTE — Progress Notes (Signed)
[facility] PROGRESS NOTE  Douglas Jordan ZOX:[id number] DOB: Dec 30, 1929 DOA: 04/01/2013 PCP: No PCP Per Patient  Brief narrative: 79 -year-old male with past medical history significant for esophageal metastatic squamous cell cancer, hepatic metastases, status post PEG tube who was admitted to [facility] ED 04/01/2013 for progressively worsening mental status changes, failure to thrive, weakness. Per family report on the admission, patient was more lethargic and was not able to get up from the bed. In ED, vital signs are stable with blood pressure 121/68, HR 104 and T max 98.1 F. O2 saturation was 96% on room air. CBC revealed hemoglobin of 8.4 platelet count of 136. BMP reveals sodium of 135, potassium of 2.8 and calcium of 12.9.  Assessment/Plan:  Principal problem: Progressive failure to thrive, severe protein calorie malnutrition, generalized weakness - in the setting of metastatic esophageal carcinoma - per oncology during last office visit discussion was to focus on supportive, comfort care - pt has PEG tube in place - appreciate palliative care consult for goals of care   Active Problems: Metastatic squamous cell carcinoma the esophagus, metastatic disease to the liver  - per oncology, plan is for focus on comfort; will consult palliative care  Anemia of chronic disease - secondary to chemotherapy/radiation, malnutrition - hemoglobin 8.4 on admission - no indications for transfusion  Hypercalcemia of malignancy - Patient is being given IV fluids which may help with reducing the calcium level - follow up BMP in am Thrombocytopenia - Likely secondary to sequela of chemotherapy - Discontinue heparin for DVT prophylaxis and use SCDs bilaterally Hyponatremia - 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   Objective: Filed Vitals:   04/01/13 0129 04/01/13 0222 04/01/13 0415 04/01/13 0632  BP: 148/77  121/68 147/76  Pulse: 119  104 109  Temp: 98.1 F (36.7 C) 98.1 F (36.7 C)  97.5 F (36.4 C)  TempSrc:  Rectal  Oral  Resp: 17  22 20   Weight:    52.2 kg (115 lb 1.3 oz)  SpO2: 96%  97% 100%    Intake/Output Summary (Last 24 hours) at 04/01/13 0746 Last data filed at 04/01/13 0640  Gross per 24 hour  Intake 143.75 ml  Output      0 ml  Net 143.75 ml    Exam:   General:  Pt is awake, no acute distress  Cardiovascular: Regular rhythm, tachycardic, (+) S1/S2  Respiratory: Clear to auscultation bilaterally, no wheezing, no crackles, no rhonchi  Abdomen: Soft, non tender, non distended, bowel sounds present, PEG in place   Extremities: No edema, pulses DP and PT palpable bilaterally  Neuro: Grossly nonfocal  Data Reviewed: Basic Metabolic Panel:  Recent Labs Lab 04/01/13 0210  NA 125*  K 2.8*  CL 93*  CO2 20  GLUCOSE 96  BUN 25*  CREATININE 1.01  CALCIUM 12.9*   Liver Function Tests:  Recent Labs Lab 04/01/13 0210  AST 26  ALT 15  ALKPHOS 227*  BILITOT 0.6  PROT 7.7  ALBUMIN 2.2*   No results found for this basename: LIPASE, AMYLASE,  in the last 168 hours No results found for this basename: AMMONIA,  in the last 168 hours CBC:  Recent Labs Lab 04/01/13 0210  WBC 9.1  HGB 8.4*  HCT 25.5*  MCV 77.3*  PLT 136*   Cardiac Enzymes: No results found for this basename: CKTOTAL, CKMB, CKMBINDEX, TROPONINI,  in the last 168 hours BNP: No components found with this basename:  POCBNP,  CBG: No results found for this basename: GLUCAP,  in the last 168 hours  No results found for this or any previous visit (from the past 240 hour(s)).   Studies: Dg Chest 2 View 04/01/2013     IMPRESSION: New bibasilar infiltrates versus atelectasis.    Scheduled Meds: . docusate  100 mg Oral Daily  .  (PRO-STAT SUGAR FREE 64)  30 mL Oral BID  . free water  200 mL Per Tube Daily  . metoCLOPramide  5 mg Oral TID AC & HS  . pantoprazole sodium  40 mg Oral Daily  . polyethylene glycol  17 g Oral Daily   Continuous Infusions: . sodium chloride Stopped (04/01/13 0329)

## 2013-04-01 NOTE — Progress Notes (Signed)
CARE MANAGEMENT NOTE 04/01/2013  Patient:  Scheier,Alexandru   Account Number:  [id number]  Date Initiated:  04/01/2013  Documentation initiated by:  Lyrik Buresh  Subjective/Objective Assessment:   hospice patient but no paper for dnr/dni ams with electrolyte changes,     Action/Plan:   tbd   Anticipated DC Date:  Apr 10, 2013   Anticipated DC Plan:  HOME W HOSPICE CARE         PAC Choice  HOSPICE   Choice offered to / List presented to:             Status of service:  In process, will continue to follow Medicare Important Message given?   (If response is "NO", the following Medicare IM given date fields will be blank) Date Medicare IM given:   Date Additional Medicare IM given:    Discharge Disposition:    Per UR Regulation:  Reviewed for med. necessity/level of care/duration of stay  If discussed at Long Length of Stay Meetings, dates discussed:    Comments:  [id number]/Marv Alfrey Lorrin Mais: Case management [phone] Chart reviewed and updated.  Next chart review due on [id number]. Needs for discharge at time of review:  none

## 2013-04-01 NOTE — ED Notes (Signed)
Dr. Gardner at bedside 

## 2013-04-02 ENCOUNTER — Telehealth: Payer: Self-pay | Admitting: *Deleted

## 2013-04-02 DIAGNOSIS — C159 Malignant neoplasm of esophagus, unspecified: Secondary | ICD-10-CM

## 2013-04-02 LAB — BASIC METABOLIC PANEL
BUN: 23 mg/dL (ref 6–23)
CO2: 17 mEq/L — ABNORMAL LOW (ref 19–32)
Calcium: 10.3 mg/dL (ref 8.4–10.5)
Creatinine, Ser: 0.97 mg/dL (ref 0.50–1.35)
GFR calc non Af Amer: 75 mL/min — ABNORMAL LOW (ref >90)
Glucose, Bld: 96 mg/dL (ref 70–99)
Sodium: 134 mEq/L — ABNORMAL LOW (ref 135–145)

## 2013-04-02 LAB — CBC
MCH: 25.2 pg — ABNORMAL LOW (ref 26.0–34.0)
MCHC: 32.4 g/dL (ref 30.0–36.0)
MCV: 77.6 fL — ABNORMAL LOW (ref 78.0–100.0)
Platelets: 133 10*3/uL — ABNORMAL LOW (ref 150–400)
RBC: 3.3 MIL/uL — ABNORMAL LOW (ref 4.22–5.81)

## 2013-04-02 MED ORDER — POTASSIUM CHLORIDE 10 MEQ/100ML IV SOLN
10.0000 meq | INTRAVENOUS | Status: DC
  Administered 2013-04-02: 10 meq via INTRAVENOUS
  Filled 2013-04-02 (×3): qty 100

## 2013-04-02 MED ORDER — OSMOLITE 1.2 CAL PO LIQD: 237.0000 mL | Freq: Four times a day (QID) | ORAL | Status: DC

## 2013-04-02 MED ORDER — OSMOLITE 1.5 CAL PO LIQD
237.0000 mL | Freq: Four times a day (QID) | ORAL | Status: DC
  Filled 2013-04-02 (×3): qty 237

## 2013-04-02 MED ORDER — OSMOLITE 1.5 CAL PO LIQD: 237.0000 mL | Freq: Four times a day (QID) | ORAL | Status: AC

## 2013-04-02 NOTE — Progress Notes (Addendum)
Room 1303 - Alejandro Mulling - [facility] RN Visit-R.Parminder Cupples RN  NON-COVERED DUE TO SELF PAY.  [facility] diagnosis is esophageal cancer.   Pt is changed to DNR code in ED per conversation with son - noted in EPIC chart..    Pt alert, language barrier.  Pt lying quietly in bed, without any symptoms of pain or discomfort.    No family present. Patient's home medication list is on shadow chart.   Conversation with attending Dr. Elisabeth Pigeon - states family wants [facility].  [facility] SW confirmed [facility] bed available today and SW has spoken with son who agrees to transfer today.  Dr. Elisabeth Pigeon and staff RN advised of DNR status and bed available at [facility] today - to transfer ASAP via [facility].  All notes faxed to [facility].   Please call [facility] @ [phone]- ask for RN Liaison or after hours,ask for on-call RN with any hospice needs.   Thank you.  Joneen Boers, RN  [facility]  Hospice Liaison  [phone])

## 2013-04-02 NOTE — Discharge Summary (Addendum)
Physician Discharge Summary  Douglas Jordan ZHY:[id number] DOB: July 28, 1929 DOA: 04/01/2013  PCP: No PCP Per Patient  Admit date: 04/01/2013 Discharge date: 04/02/2013   Discharge Diagnoses:  Principal Problem:   Failure to thrive Active Problems:   Protein-calorie malnutrition, severe   Malignant neoplasm of lower third of esophagus   Hypercalcemia   Thrombocytopenia, unspecified   Hyponatremia   Hypokalemia   Cancer of esophagus    Discharge Condition: medically stable for transfer to residential hospice   Diet recommendation: tube feeds  History of present illness:  79 -year-old male with past medical history significant for esophageal metastatic squamous cell cancer, hepatic metastases, status post PEG tube who was admitted to [facility] ED 04/01/2013 for progressively worsening mental status changes, failure to thrive, weakness. Per family report on the admission, patient was more lethargic and was not able to get up from the bed.  In ED, vital signs are stable with blood pressure 121/68, HR 104 and T max 98.1 F. O2 saturation was 96% on room air. CBC revealed hemoglobin of 8.4 platelet count of 136. BMP reveals sodium of 135, potassium of 2.8 and calcium of 12.9.  Assessment/Plan:  Principal problem:  Progressive failure to thrive, severe protein calorie malnutrition, generalized weakness  - in the setting of metastatic esophageal carcinoma  - per oncology during last office visit discussion was to focus on supportive, comfort care  - pt has PEG tube in place   Active Problems:  Metastatic squamous cell carcinoma the esophagus, metastatic disease to the liver  - per oncology, plan is for focus on comfort; will consult palliative care  Anemia of chronic disease  - secondary to chemotherapy/radiation, malnutrition  - hemoglobin 8.4 on admission  - no indications for transfusion  Hypercalcemia of malignancy  - Patient is being given IV fluids which may help with reducing the calcium  level  - calcium 10.3 this am  Thrombocytopenia  - Likely secondary to sequela of chemotherapy  - Discontinued heparin for DVT prophylaxis and use SCDs bilaterally  Hyponatremia  - 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  - Sodium improved with IV fluids from 125 to 134  Hypokalemia  - Potassium 2.9 today  - supplemented with IV potassium again today   Code Status: DNR/DNI  Family Communication: no family at the bedside    Consultants:  Palliative care Procedures:  None  Antibiotics:  None   Signed:  Manson Passey, MD  [facility] 04/02/2013, 9:17 AM  Pager #: [phone]   Discharge Exam: Filed Vitals:   04/02/13 0522  BP: 143/81  Pulse: 98  Temp: 97.6 F (36.4 C)  Resp: 20   Filed Vitals:   04/01/13 0632 04/01/13 1500 04/01/13 2201 04/02/13 0522  BP: 147/76 154/79 140/74 143/81  Pulse: 109 118 109 98  Temp: 97.5 F (36.4 C) 98 F (36.7 C) 98.3 F (36.8 C) 97.6 F (36.4 C)  TempSrc: Oral Axillary Axillary Oral  Resp: 20 20 20 20   Weight: 52.2 kg (115 lb 1.3 oz)     SpO2: 100% 100% 99% 99%    General: Pt is alert, follows commands appropriately, not in acute distress Cardiovascular: Regular rate and rhythm, S1/S2 +, no murmurs, no rubs, no gallops Respiratory: Clear to auscultation bilaterally, no wheezing, no crackles, no rhonchi Abdominal: Soft, non tender, non distended, bowel sounds +, no guarding; peg in place Extremities: no edema, no cyanosis, pulses palpable bilaterally DP and PT Neuro: Grossly nonfocal  Discharge Instructions  Discharge Orders   Future Appointments Provider  Department Dept Phone   04/07/2013 4:30 PM Doris Cheadle, MD [facility] [phone]   04/15/2013 12:30 PM Ladene Artist, MD [facility] [phone]   Future Orders Complete By Expires   Call MD for:  difficulty breathing, headache or visual disturbances  As directed    Call MD for:  persistant dizziness or  light-headedness  As directed    Call MD for:  persistant nausea and vomiting  As directed    Call MD for:  severe uncontrolled pain  As directed    Diet - low sodium heart healthy  As directed    Increase activity slowly  As directed        Medication List      Medication List         bisacodyl 10 MG suppository  Commonly known as:  DULCOLAX  Place 10 mg rectally daily as needed for mild constipation or moderate constipation.     docusate 50 MG/5ML liquid  Commonly known as:  COLACE  Take 100 mg by mouth daily.     feeding supplement (OSMOLITE 1.5 CAL) Liqd  Place 237 mLs into feeding tube 4 (four) times daily.     feeding supplement (PRO-STAT SUGAR FREE 64) Liqd  Take 30 mLs by mouth 2 (two) times daily.     free water Soln  Place 200 mLs into feeding tube daily.     lidocaine-prilocaine cream  Commonly known as:  EMLA  Apply 1 application topically daily as needed. port     metoCLOPramide 5 MG/5ML solution  Commonly known as:  REGLAN  Take 5 mg by mouth 4 (four) times daily -  before meals and at bedtime.     morphine 10 MG/5ML solution  Take 20 mg by mouth 4 (four) times daily as needed for severe pain.     omeprazole 2 mg/mL Susp  Commonly known as:  PRILOSEC  Take 20 mg by mouth daily.     polyethylene glycol packet  Commonly known as:  MIRALAX / GLYCOLAX  Take 17 g by mouth daily.              Follow-up Information   Follow up with No PCP Per Patient.   Specialty:  General Practice   Contact information:   [address] [place] [address] [phone]        The results of significant diagnostics from this hospitalization (including imaging, microbiology, ancillary and laboratory) are listed below for reference.    Significant Diagnostic Studies: Dg Chest 2 View  04/01/2013   CLINICAL DATA:  Weakness.  Lethargy.  Esophageal carcinoma.  EXAM: CHEST  2 VIEW  COMPARISON:  12/30/2012  FINDINGS: New bibasilar infiltrates or atelectasis  demonstrated. Mild cardiomegaly and ectasia of the thoracic ureter are stable. Right-sided power port remains in appropriate position.  IMPRESSION: New bibasilar infiltrates versus atelectasis.   Electronically Signed   By: Myles Rosenthal M.D.   On: 04/01/2013 02:26    Microbiology: No results found for this or any previous visit (from the past 240 hour(s)).   Labs: Basic Metabolic Panel:  Recent Labs Lab 04/01/13 0210 04/02/13 0535  NA 125* 134*  K 2.8* 2.9*  CL 93* 106  CO2 20 17*  GLUCOSE 96 96  BUN 25* 23  CREATININE 1.01 0.97  CALCIUM 12.9* 10.3   Liver Function Tests:  Recent Labs Lab 04/01/13 0210  AST 26  ALT 15  ALKPHOS 227*  BILITOT 0.6  PROT 7.7  ALBUMIN 2.2*   No results found for this basename: LIPASE, AMYLASE,  in the last 168 hours No results found for this basename: AMMONIA,  in the last 168 hours CBC:  Recent Labs Lab 04/01/13 0210 04/02/13 0535  WBC 9.1 8.8  HGB 8.4* 8.3*  HCT 25.5* 25.6*  MCV 77.3* 77.6*  PLT 136* 133*   Cardiac Enzymes: No results found for this basename: CKTOTAL, CKMB, CKMBINDEX, TROPONINI,  in the last 168 hours BNP: BNP (last 3 results) No results found for this basename: PROBNP,  in the last 8760 hours CBG: No results found for this basename: GLUCAP,  in the last 168 hours  Time coordinating discharge: Over 30 minutes

## 2013-04-02 NOTE — Progress Notes (Addendum)
Pt accepted to [facility]. Patient to be transported by ambulance. Pt family is aware of patient being transferred to [facility] per hospice liaison.  CSW completed ptar form. CSW awaiting discharge summary and then will call ptar.   Frutoso Schatz [phone]  ED CSW 04/02/2013 9:07am

## 2013-04-02 NOTE — Telephone Encounter (Signed)
Being admitted today to [facility] from hospital.

## 2013-04-02 NOTE — Progress Notes (Signed)
INITIAL NUTRITION ASSESSMENT  DOCUMENTATION CODES Per approved criteria  -Not Applicable   INTERVENTION: - Start resume home TF regimen of Osmolite 1.5, 355 mL 4 times a day with 60 mL of free water before and after each bolus feeding plus 200 mL of water once a day. Tube feeding provides 1710 calories, 79 g protein, 1170 mL free water. Total free water 1850 mL daily and meets 100% of estimated calorie needs, 93% estimated protein needs. - Will continue to monitor   NUTRITION DIAGNOSIS: Inadequate oral inatke related to inability to eat as evidenced by NPO.   Goal: Resume home TF regimen  Monitor:  Weights, labs, TF tolerance  Reason for Assessment: Consult for TF management, low braden  79 y.o. male  Admitting Dx: Failure to thrive  ASSESSMENT: Pt with end stage metastatic esophageal CA, s/p PEG on home hospice. Admitted with altered mental status. Per H&P, son reports pt has been non-verbal for the past several weeks, yesterday he was lethargic and non-ambulatory which is a change. Plan is to continue TF for now. Per [facility] notes "Pt receives Osmolite 1.5 qid via peg gravity. He gets 223ml's of free water a day thru PEG". Pt has been followed by [facility], last note 01/14/13 indicated pt was 122.9 pounds, now 115 pounds. Pt alone in room. Per discussion with RN and MD, plan is to transfer pt to [facility] today.   Sodium low but improving Potassium low, getting IV replacement  Alk phos elevated   Home TF regimen of Osmolite 1.5, 355 mL 4 times a day with 60 mL of free water before and after each bolus feeding plus 200 mL of water once a day. Tube feeding provides 1710 calories, 79 g protein, 1170 mL free water. Total free water 1850 mL daily and meets 100% of estimated calorie needs, 93% estimated protein needs    Height: Ht Readings from Last 1 Encounters:  03/15/13 5\' 2"  (1.575 m)    Weight: Wt Readings from Last 1 Encounters:  04/01/13 115 lb  1.3 oz (52.2 kg)    Ideal Body Weight: 118 lb  % Ideal Body Weight: 97%  Wt Readings from Last 10 Encounters:  04/01/13 115 lb 1.3 oz (52.2 kg)  03/23/13 116 lb 4.8 oz (52.753 kg)  03/15/13 114 lb 12.8 oz (52.073 kg)  02/22/13 120 lb 8 oz (54.658 kg)  02/18/13 117 lb 14.4 oz (53.479 kg)  02/04/13 123 lb 3.2 oz (55.883 kg)  01/29/13 122 lb 4.8 oz (55.475 kg)  01/14/13 122 lb 14.4 oz (55.747 kg)  12/30/12 125 lb (56.7 kg)  12/25/12 124 lb 9.6 oz (56.518 kg)    Usual Body Weight: 125 lb  % Usual Body Weight: 92%  BMI:  Body mass index is 21.04 kg/(m^2).  Estimated Nutritional Needs: Kcal: 1610-9604 Protein: 85-95g Fluid: 1.8L/day  Skin: Intact  Diet Order: NPO  EDUCATION NEEDS: -No education needs identified at this time   Intake/Output Summary (Last 24 hours) at 04/02/13 0849 Last data filed at 04/02/13 0354  Gross per 24 hour  Intake      0 ml  Output   1850 ml  Net  -1850 ml    Last BM: PTA   Labs:   Recent Labs Lab 04/01/13 0210 04/02/13 0535  NA 125* 134*  K 2.8* 2.9*  CL 93* 106  CO2 20 17*  BUN 25* 23  CREATININE 1.01 0.97  CALCIUM 12.9* 10.3  GLUCOSE 96 96  CBG (last 3)  No results found for this basename: GLUCAP,  in the last 72 hours  Scheduled Meds: . docusate  100 mg Oral Daily  . feeding supplement (PRO-STAT SUGAR FREE 64)  30 mL Oral BID  . free water  200 mL Per Tube Daily  . metoCLOPramide (REGLAN) injection  5 mg Intravenous Q8H  . pantoprazole (PROTONIX) IV  40 mg Intravenous QHS  . polyethylene glycol  17 g Oral Daily  . potassium chloride  10 mEq Intravenous Q1 Hr x 3    Continuous Infusions: . sodium chloride 125 mL/hr at 04/02/13 1610    Past Medical History  Diagnosis Date  . GERD (gastroesophageal reflux disease) No knownn hx but, pt reports symptoms of mid sternal pain at xiphoid that is irriated when eating.  . Esophageal cancer 11/13/2012  . Hypercalcemia 01/14/2013  . S/P radiation therapy  12/14/2012-12/25/2012    Esophageal tumor  / 30 Gy in 10 fractions    Past Surgical History  Procedure Laterality Date  . Esophagogastroduodenoscopy (egd) with esophageal dilation N/A 11/13/2012    Procedure: ESOPHAGOGASTRODUODENOSCOPY (EGD) WITH ESOPHAGEAL DILATION;  Surgeon: Rachael Fee, MD;  Location: [facility] ENDOSCOPY;  Service: Endoscopy;  Laterality: N/A;  . Savory dilation N/A 11/13/2012    Procedure: SAVORY DILATION;  Surgeon: Rachael Fee, MD;  Location: [facility] ENDOSCOPY;  Service: Endoscopy;  Laterality: N/A;    Levon Hedger MS, RD, LDN [phone] Pager [phone] After Hours Pager

## 2013-04-02 NOTE — Progress Notes (Signed)
[facility] PROGRESS NOTE  Douglas Jordan ZOX:[id number] DOB: 1930/02/19 DOA: 04/01/2013 PCP: No PCP Per Patient  Brief narrative: 79 -year-old male with past medical history significant for esophageal metastatic squamous cell cancer, hepatic metastases, status post PEG tube who was admitted to [facility] ED 04/01/2013 for progressively worsening mental status changes, failure to thrive, weakness. Per family report on the admission, patient was more lethargic and was not able to get up from the bed.  In ED, vital signs are stable with blood pressure 121/68, HR 104 and T max 98.1 F. O2 saturation was 96% on room air. CBC revealed hemoglobin of 8.4 platelet count of 136. BMP reveals sodium of 135, potassium of 2.8 and calcium of 12.9.   Assessment/Plan:   Principal problem:  Progressive failure to thrive, severe protein calorie malnutrition, generalized weakness  - in the setting of metastatic esophageal carcinoma  - per oncology during last office visit discussion was to focus on supportive, comfort care  - pt has PEG tube in place  - appreciate hospice service to look into residential hospice   Active Problems:  Metastatic squamous cell carcinoma the esophagus, metastatic disease to the liver  - per oncology, plan is for focus on comfort; will consult palliative care  Anemia of chronic disease  - secondary to chemotherapy/radiation, malnutrition  - hemoglobin 8.4 on admission  - no indications for transfusion  Hypercalcemia of malignancy  - Patient is being given IV fluids which may help with reducing the calcium level  - calcium 10.3 this am Thrombocytopenia  - Likely secondary to sequela of chemotherapy  - Discontinued heparin for DVT prophylaxis and use SCDs bilaterally  Hyponatremia  - 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  - Sodium improved with IV fluids from 125 to 134 Hypokalemia  - Potassium 2.9 today  - will supplement with IV potassium again today  Code Status: DNR/DNI  Family  Communication: no family at the bedside  Disposition Plan: pending d/c plan; possible residential hospice   Manson Passey, MD  [facility]  Pager [phone]   Consultants:  Palliative care Procedures:  None  Antibiotics:  None    Manson Passey, MD  [facility] Pager [phone]  If 7PM-7AM, please contact night-coverage [email or web address] Password [email or web address] 04/02/2013, 6:20 AM   LOS: 1 day    HPI/Subjective: No acute overnight events.  Objective: Filed Vitals:   04/01/13 0632 04/01/13 1500 04/01/13 2201 04/02/13 0522  BP: 147/76 154/79 140/74 143/81  Pulse: 109 118 109 98  Temp: 97.5 F (36.4 C) 98 F (36.7 C) 98.3 F (36.8 C) 97.6 F (36.4 C)  TempSrc: Oral Axillary Axillary Oral  Resp: 20 20 20 20   Weight: 52.2 kg (115 lb 1.3 oz)     SpO2: 100% 100% 99% 99%    Intake/Output Summary (Last 24 hours) at 04/02/13 3086 Last data filed at 04/02/13 0354  Gross per 24 hour  Intake 143.75 ml  Output   2425 ml  Net -2281.25 ml    Exam:   General:  Pt is not in acute distress  Cardiovascular: Regular rate and rhythm, S1/S2 appreciated  Respiratory: Clear to auscultation bilaterally, no wheezing  Abdomen: Soft, non tender, non distended,PEG in place  Extremities: No edema, pulses DP and PT palpable bilaterally  Neuro: Grossly nonfocal  Data Reviewed: Basic Metabolic Panel:  Recent Labs Lab 04/01/13 0210  NA 125*  K 2.8*  CL 93*  CO2 20  GLUCOSE 96  BUN 25*  CREATININE 1.01  CALCIUM 12.9*   Liver  Function Tests:  Recent Labs Lab 04/01/13 0210  AST 26  ALT 15  ALKPHOS 227*  BILITOT 0.6  PROT 7.7  ALBUMIN 2.2*   No results found for this basename: LIPASE, AMYLASE,  in the last 168 hours No results found for this basename: AMMONIA,  in the last 168 hours CBC:  Recent Labs Lab 04/01/13 0210 04/02/13 0535  WBC 9.1 8.8  HGB 8.4* 8.3*  HCT 25.5* 25.6*  MCV 77.3* 77.6*  PLT 136* 133*   Cardiac Enzymes: No results found for this  basename: CKTOTAL, CKMB, CKMBINDEX, TROPONINI,  in the last 168 hours BNP: No components found with this basename: POCBNP,  CBG: No results found for this basename: GLUCAP,  in the last 168 hours  No results found for this or any previous visit (from the past 240 hour(s)).   Studies: Dg Chest 2 View 04/01/2013    IMPRESSION: New bibasilar infiltrates versus atelectasis.      Scheduled Meds: . docusate  100 mg Oral Daily  . feeding supplement (PRO-STAT SUGAR FREE 64)  30 mL Oral BID  . free water  200 mL Per Tube Daily  . metoCLOPramide (REGLAN) injection  5 mg Intravenous Q8H  . pantoprazole (PROTONIX) IV  40 mg Intravenous QHS  . polyethylene glycol  17 g Oral Daily   Continuous Infusions: . sodium chloride 125 mL/hr at 04/01/13 2256

## 2013-04-02 NOTE — Progress Notes (Signed)
Patient to be transferred to [facility] via [facility]. Patient stable from AM assessment. Report called to receiving RN at [facility]. Patient belongings packed.

## 2013-04-05 ENCOUNTER — Telehealth: Payer: Self-pay | Admitting: *Deleted

## 2013-04-05 NOTE — Telephone Encounter (Signed)
Received fax from [facility] notifying Dr. Truett Perna of pt's death 2013-04-05. MD made aware.

## 2013-04-05 DEATH — deceased

## 2013-04-07 ENCOUNTER — Ambulatory Visit: Payer: Self-pay | Admitting: Internal Medicine

## 2013-04-15 ENCOUNTER — Ambulatory Visit: Payer: Self-pay | Admitting: Oncology

## 2013-12-04 DEATH — deceased

## 2013-12-29 ENCOUNTER — Other Ambulatory Visit: Payer: Self-pay | Admitting: Pharmacist

## 2014-07-22 IMAGING — CT CT CHEST W/ CM
2 of 5 series · 14 of 46 positions shown, 16 images · IV contrast (APPLIED)
Comparison: None.

CT CHEST

CLINICAL DATA: Esophageal carcinoma.

CT CHEST, ABDOMEN AND PELVIS WITH CONTRAST
TECHNIQUE: Multidetector CT imaging of the chest, abdomen and
pelvis was performed following the standard protocol during bolus
administration of intravenous contrast.
Contrast: 100mL OMNIPAQUE IOHEXOL 300 MG/ML  SOLN

[Series 2: abd/ pelvis 5.0 i30f 1 · axial · 0.70mm/px · z∈[+1031,+1516]mm · 11 of 111 slices shown, 13 images]
[im 7/111  soft-tissue]
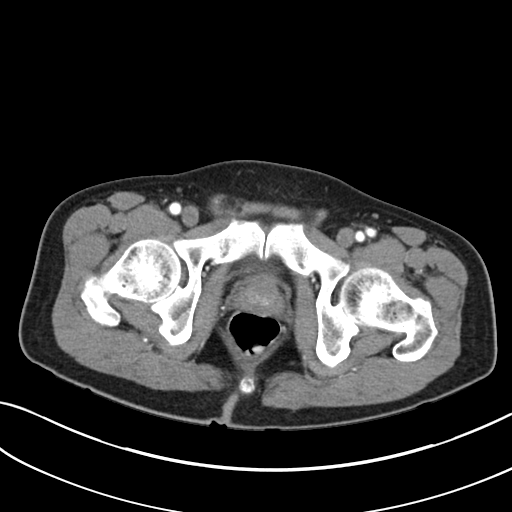
[im 7/111  bone]
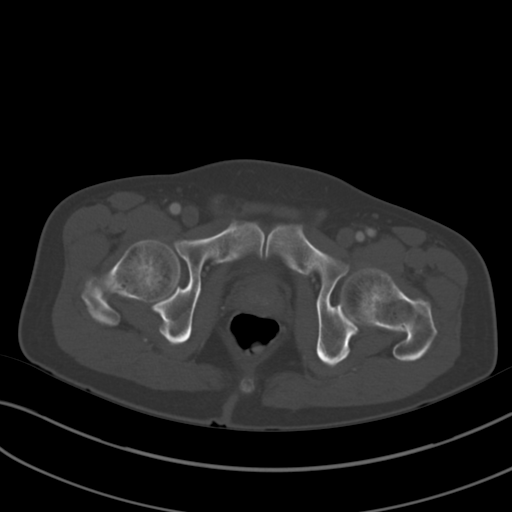
[im 19/111  soft-tissue]
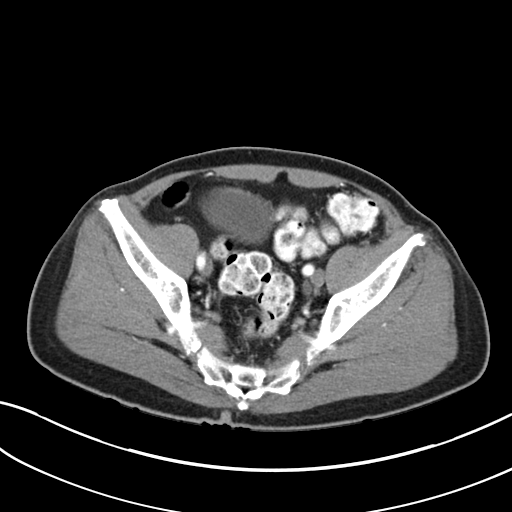
[im 25/111  soft-tissue]
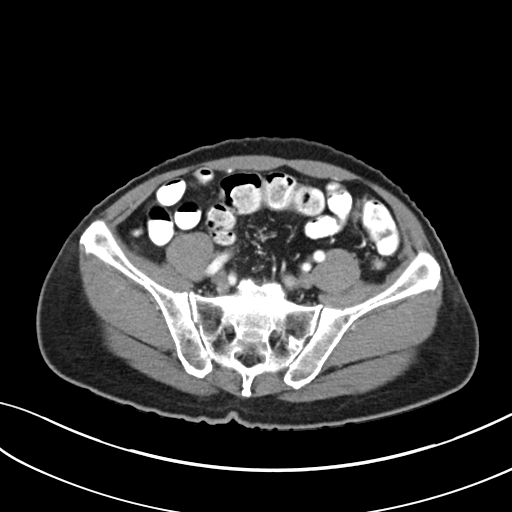
[im 37/111  soft-tissue]
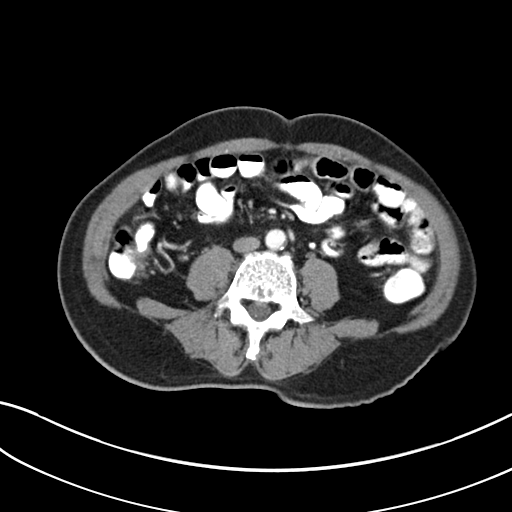
[im 43/111  soft-tissue]
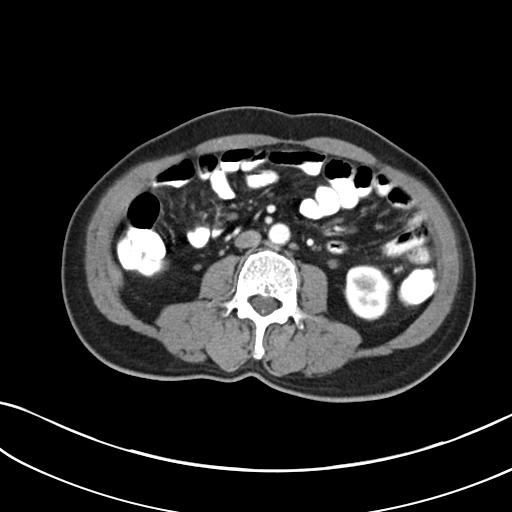
[im 56/111  soft-tissue]
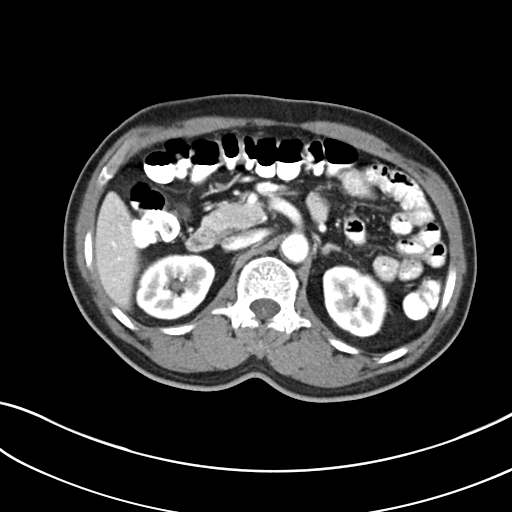
[im 68/111  soft-tissue]
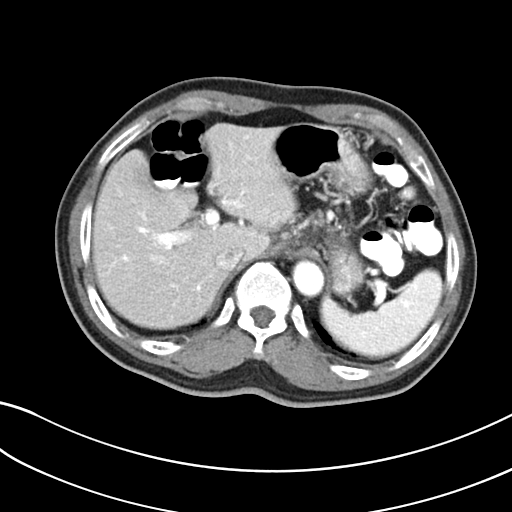
[im 74/111  soft-tissue]
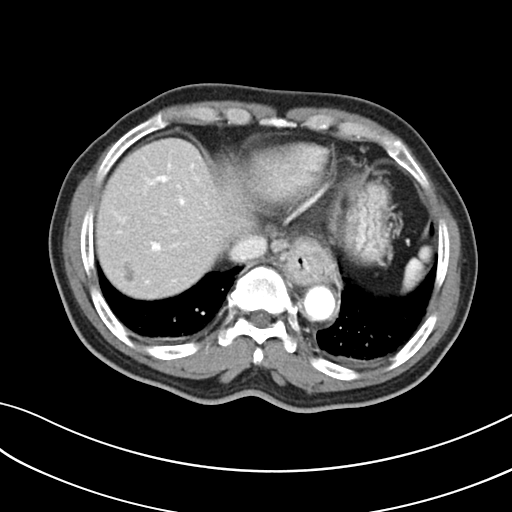
[im 86/111  soft-tissue]
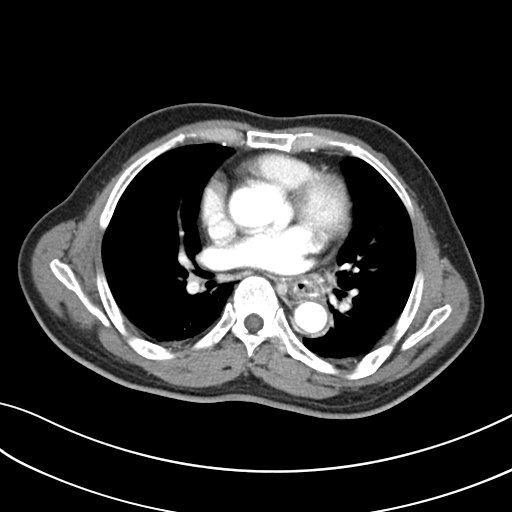
[im 86/111  bone]
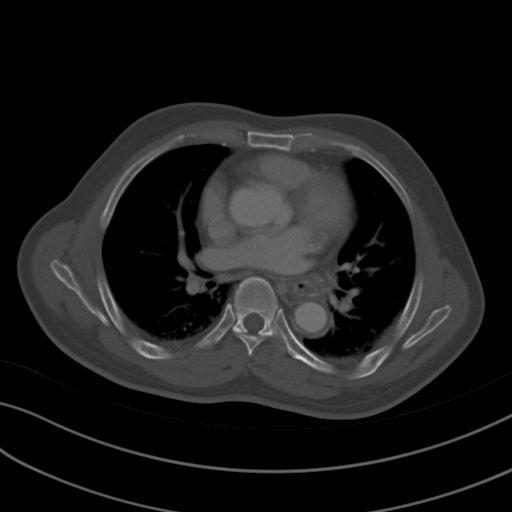
[im 92/111  soft-tissue]
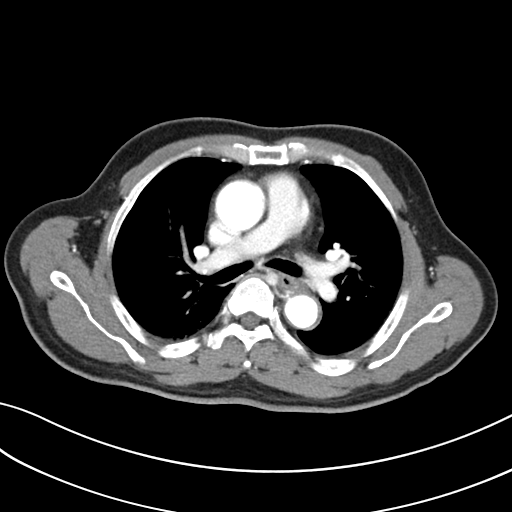
[im 104/111  soft-tissue]
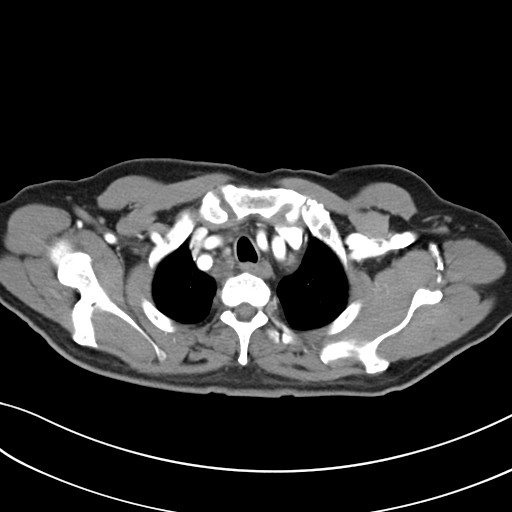

[Series 5: cor st · coronal · 0.76mm/px · 3 of 81 slices shown]
[im 27/81  soft-tissue]
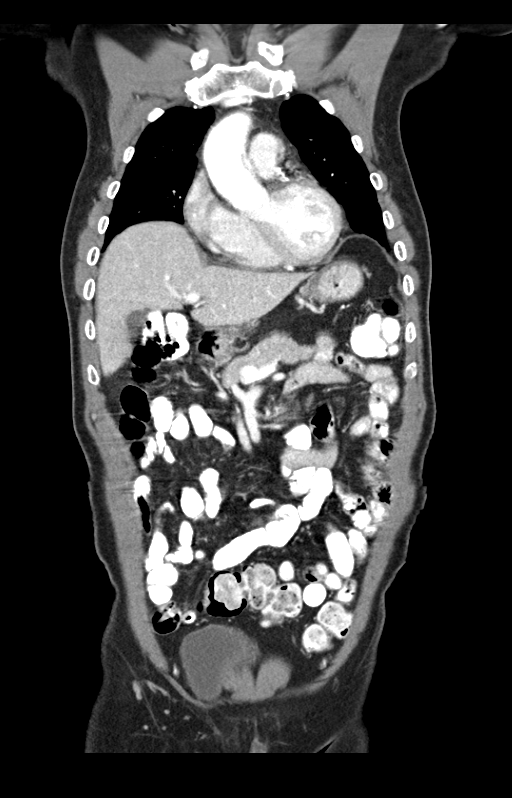
[im 36/81  soft-tissue]
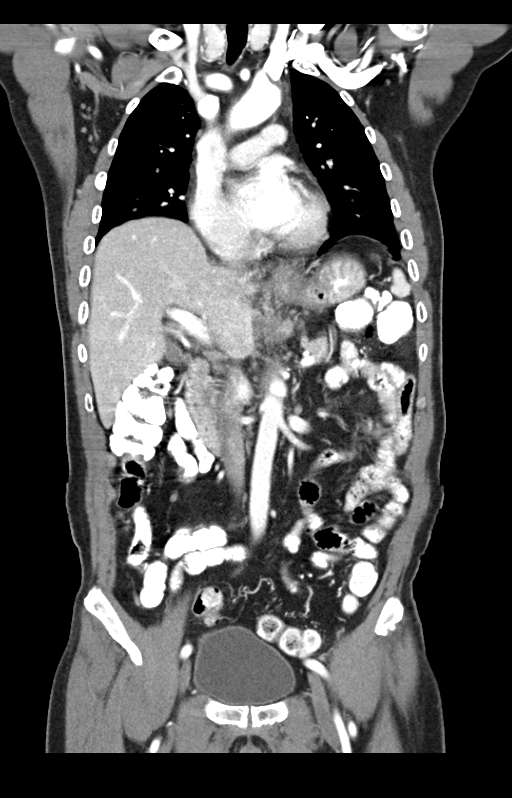
[im 45/81  soft-tissue]
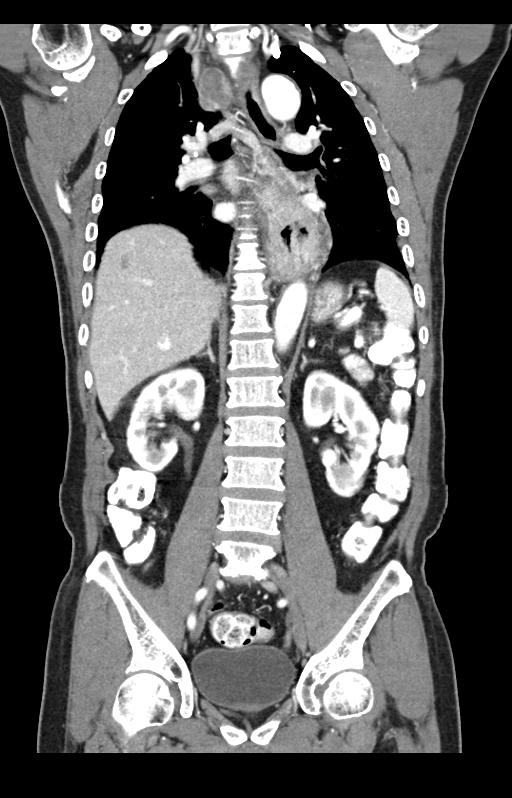

[14 of 46 positions shown; findings below may reference images not displayed]

FINDINGS: A bulky annular soft tissue mass is seen involving the
distal thoracic esophagus which measures approximately 3.2 x 4.0 x
5.9 cm.  Para esophageal lymphadenopathy is seen within the
posterior mediastinum both anterior and posterior to the descending
thoracic aorta.  Largest lymph node measures 1.5 cm on image 33.
Mediastinal lymphadenopathy also seen in the right paratracheal
region measuring 2.0 x 2.8 cm on image 10. A 1.3 cm right hilar
lymph node is seen with a small central focus of calcification.
This could represent metastatic disease or partially calcified
granulomatous disease.

No evidence of pleural or pericardial effusion. A 6 mm noncalcified
pulmonary nodule is seen in the right lower lobe on image 29, which
raises suspicion for pulmonary metastasis.  No other suspicious
pulmonary nodules or masses are identified.
IMPRESSION: 1.  Bulky annular soft tissue mass involving the distal thoracic
esophagus, consistent with known primary esophageal carcinoma.
2.  Metastatic lymphadenopathy in the posterior mediastinum and
right paratracheal region.  Mild right hilar lymphadenopathy,
possibly also secondary to metastatic disease.
3.  6 mm noncalcified right lower lobe pulmonary nodule.  Pulmonary
metastasis cannot be excluded.  Consider PET CT scan for further
evaluation

CT ABDOMEN AND PELVIS
FINDINGS: Upper abdominal lymphadenopathy is seen gastrohepatic
ligament measuring 2.9 x 3.8 cm on image 47.  No other
pathologically enlarged nodes are seen within the abdomen or
pelvis.

A hypovascular lesion is seen in the posterior segment right
hepatic lobe measuring 1.5 cm on image 37.  This is suspicious for
a liver metastasis.  No other liver lesions are identified.

The gallbladder, pancreas, spleen, adrenal glands, kidneys are
normal in appearance.  No evidence of hydronephrosis.  No pelvic
masses or lymphadenopathy identified.  No evidence of inflammatory
process or abnormal fluid collections.  No suspicious bone lesions
identified.
IMPRESSION: 1.  Metastatic upper abdominal lymphadenopathy in the gastrohepatic
ligament.
2.  1.5 cm hypovascular lesion in the posterior segment of the
right hepatic lobe, suspicious for liver metastasis.  Consider PET
CT scan or abdomen MRI without and with contrast for further
evaluation.
